# Patient Record
Sex: Female | Born: 1935
Health system: Southern US, Community
[De-identification: ages and names within clinical notes are randomized; demographics above are authoritative.]

## PROBLEM LIST (undated history)

## (undated) DIAGNOSIS — M2540 Effusion, unspecified joint: Secondary | ICD-10-CM

## (undated) DIAGNOSIS — K5790 Diverticulosis of intestine, part unspecified, without perforation or abscess without bleeding: Secondary | ICD-10-CM

## (undated) DIAGNOSIS — Z860100 Personal history of colon polyps, unspecified: Secondary | ICD-10-CM

## (undated) DIAGNOSIS — M79606 Pain in leg, unspecified: Secondary | ICD-10-CM

## (undated) DIAGNOSIS — R002 Palpitations: Secondary | ICD-10-CM

## (undated) DIAGNOSIS — R233 Spontaneous ecchymoses: Secondary | ICD-10-CM

## (undated) DIAGNOSIS — M353 Polymyalgia rheumatica: Secondary | ICD-10-CM

## (undated) DIAGNOSIS — D126 Benign neoplasm of colon, unspecified: Secondary | ICD-10-CM

## (undated) DIAGNOSIS — R351 Nocturia: Secondary | ICD-10-CM

## (undated) DIAGNOSIS — M1990 Unspecified osteoarthritis, unspecified site: Secondary | ICD-10-CM

## (undated) DIAGNOSIS — M2550 Pain in unspecified joint: Secondary | ICD-10-CM

## (undated) DIAGNOSIS — K259 Gastric ulcer, unspecified as acute or chronic, without hemorrhage or perforation: Secondary | ICD-10-CM

## (undated) DIAGNOSIS — R490 Dysphonia: Secondary | ICD-10-CM

## (undated) DIAGNOSIS — Z8619 Personal history of other infectious and parasitic diseases: Secondary | ICD-10-CM

## (undated) DIAGNOSIS — M539 Dorsopathy, unspecified: Secondary | ICD-10-CM

## (undated) DIAGNOSIS — M797 Fibromyalgia: Secondary | ICD-10-CM

## (undated) DIAGNOSIS — R238 Other skin changes: Secondary | ICD-10-CM

## (undated) HISTORY — PX: TUBAL LIGATION: SHX77

## (undated) HISTORY — DX: Polymyalgia rheumatica: M35.3

## (undated) HISTORY — PX: CYSTECTOMY: SUR359

## (undated) HISTORY — PX: CATARACT EXTRACTION, BILATERAL: SHX1313

## (undated) HISTORY — DX: Personal history of other infectious and parasitic diseases: Z86.19

## (undated) HISTORY — DX: Benign neoplasm of colon, unspecified: D12.6

## (undated) HISTORY — DX: Palpitations: R00.2

## (undated) HISTORY — PX: PARTIAL HYSTERECTOMY: SHX80

## (undated) HISTORY — DX: Pain in leg, unspecified: M79.606

## (undated) HISTORY — PX: OTHER SURGICAL HISTORY: SHX169

## (undated) HISTORY — PX: CARPAL TUNNEL RELEASE: SHX101

## (undated) HISTORY — PX: ABDOMINAL HYSTERECTOMY: SHX81

## (undated) HISTORY — PX: COLONOSCOPY: SHX174

## (undated) HISTORY — DX: Diverticulosis of intestine, part unspecified, without perforation or abscess without bleeding: K57.90

---

## 1996-07-28 DIAGNOSIS — D126 Benign neoplasm of colon, unspecified: Secondary | ICD-10-CM

## 1996-07-28 HISTORY — DX: Benign neoplasm of colon, unspecified: D12.6

## 1996-08-11 ENCOUNTER — Encounter: Payer: Self-pay | Admitting: Gastroenterology

## 1999-03-29 ENCOUNTER — Other Ambulatory Visit: Admission: RE | Admit: 1999-03-29 | Discharge: 1999-03-29 | Payer: Self-pay | Admitting: General Surgery

## 1999-04-09 ENCOUNTER — Ambulatory Visit (HOSPITAL_BASED_OUTPATIENT_CLINIC_OR_DEPARTMENT_OTHER): Admission: RE | Admit: 1999-04-09 | Discharge: 1999-04-09 | Payer: Self-pay | Admitting: General Surgery

## 1999-11-22 ENCOUNTER — Encounter (INDEPENDENT_AMBULATORY_CARE_PROVIDER_SITE_OTHER): Payer: Self-pay | Admitting: Specialist

## 1999-11-22 ENCOUNTER — Other Ambulatory Visit: Admission: RE | Admit: 1999-11-22 | Discharge: 1999-11-22 | Payer: Self-pay | Admitting: Gastroenterology

## 2006-02-26 ENCOUNTER — Ambulatory Visit: Payer: Self-pay | Admitting: Gastroenterology

## 2006-03-12 ENCOUNTER — Encounter (INDEPENDENT_AMBULATORY_CARE_PROVIDER_SITE_OTHER): Payer: Self-pay | Admitting: *Deleted

## 2006-03-12 ENCOUNTER — Ambulatory Visit: Payer: Self-pay | Admitting: Gastroenterology

## 2006-05-20 ENCOUNTER — Encounter: Admission: RE | Admit: 2006-05-20 | Discharge: 2006-05-20 | Payer: Self-pay | Admitting: Sports Medicine

## 2006-07-31 ENCOUNTER — Encounter (INDEPENDENT_AMBULATORY_CARE_PROVIDER_SITE_OTHER): Payer: Self-pay | Admitting: *Deleted

## 2006-07-31 ENCOUNTER — Encounter: Admission: RE | Admit: 2006-07-31 | Discharge: 2006-07-31 | Payer: Self-pay | Admitting: Rheumatology

## 2006-08-20 ENCOUNTER — Ambulatory Visit: Payer: Self-pay | Admitting: Oncology

## 2006-09-28 LAB — CBC WITH DIFFERENTIAL/PLATELET
Basophils Absolute: 0 10*3/uL (ref 0.0–0.1)
EOS%: 0.4 % (ref 0.0–7.0)
LYMPH%: 16.3 % (ref 14.0–48.0)
MCH: 32.2 pg (ref 26.0–34.0)
MCV: 94 fL (ref 81.0–101.0)
MONO%: 4.3 % (ref 0.0–13.0)
Platelets: 336 10*3/uL (ref 145–400)
RBC: 4.45 10*6/uL (ref 3.70–5.32)
RDW: 15.3 % — ABNORMAL HIGH (ref 11.3–14.5)

## 2006-09-28 LAB — MORPHOLOGY: RBC Comments: NORMAL

## 2006-09-28 LAB — ERYTHROCYTE SEDIMENTATION RATE: Sed Rate: 5 mm/hr (ref 0–30)

## 2006-10-03 LAB — KAPPA/LAMBDA LIGHT CHAINS: Kappa:Lambda Ratio: 1.63 (ref 0.26–1.65)

## 2006-10-03 LAB — IMMUNOFIXATION ELECTROPHORESIS
IgA: 451 mg/dL — ABNORMAL HIGH (ref 68–378)
IgM, Serum: 73 mg/dL (ref 60–263)
Total Protein, Serum Electrophoresis: 7 g/dL (ref 6.0–8.3)

## 2006-10-05 ENCOUNTER — Ambulatory Visit: Payer: Self-pay | Admitting: Oncology

## 2006-10-05 LAB — CRYOGLOBULIN: Cryoglobulin: NEGATIVE

## 2006-10-23 LAB — UIFE/LIGHT CHAINS/TP QN, 24-HR UR
Albumin, U: DETECTED
Free Kappa/Lambda Ratio: 2.11 ratio (ref 0.46–4.00)
Free Lambda Excretion/Day: 1.98 mg/d
Free Lambda Lt Chains,Ur: 0.09 mg/dL (ref 0.08–1.01)
Free Lt Chn Excr Rate: 4.18 mg/d
Time: 24 hours
Total Protein, Urine-Ur/day: 11 mg/d (ref 10–140)
Total Protein, Urine: 0.5 mg/dL
Volume, Urine: 2200 mL

## 2006-12-29 ENCOUNTER — Ambulatory Visit: Payer: Self-pay | Admitting: Internal Medicine

## 2008-03-07 ENCOUNTER — Encounter: Payer: Self-pay | Admitting: Internal Medicine

## 2008-07-05 ENCOUNTER — Encounter: Payer: Self-pay | Admitting: Internal Medicine

## 2008-07-26 ENCOUNTER — Telehealth: Payer: Self-pay | Admitting: Gastroenterology

## 2008-08-02 ENCOUNTER — Encounter: Admission: RE | Admit: 2008-08-02 | Discharge: 2008-08-02 | Payer: Self-pay | Admitting: Orthopedic Surgery

## 2008-08-15 ENCOUNTER — Ambulatory Visit: Payer: Self-pay | Admitting: Gastroenterology

## 2008-08-15 DIAGNOSIS — K5150 Left sided colitis without complications: Secondary | ICD-10-CM

## 2008-08-15 DIAGNOSIS — Z860100 Personal history of colon polyps, unspecified: Secondary | ICD-10-CM | POA: Insufficient documentation

## 2008-09-13 ENCOUNTER — Encounter: Payer: Self-pay | Admitting: Gastroenterology

## 2008-09-13 ENCOUNTER — Ambulatory Visit: Payer: Self-pay | Admitting: Gastroenterology

## 2008-09-14 ENCOUNTER — Encounter: Payer: Self-pay | Admitting: Gastroenterology

## 2008-10-11 ENCOUNTER — Telehealth: Payer: Self-pay | Admitting: Gastroenterology

## 2008-11-02 ENCOUNTER — Encounter: Payer: Self-pay | Admitting: Internal Medicine

## 2008-11-15 ENCOUNTER — Ambulatory Visit (HOSPITAL_COMMUNITY): Admission: RE | Admit: 2008-11-15 | Discharge: 2008-11-15 | Payer: Self-pay | Admitting: Physician Assistant

## 2009-06-04 ENCOUNTER — Telehealth: Payer: Self-pay | Admitting: Gastroenterology

## 2009-07-16 ENCOUNTER — Ambulatory Visit: Payer: Self-pay | Admitting: Gastroenterology

## 2009-11-12 ENCOUNTER — Telehealth: Payer: Self-pay | Admitting: Gastroenterology

## 2009-11-15 ENCOUNTER — Ambulatory Visit: Payer: Self-pay | Admitting: Internal Medicine

## 2009-11-15 DIAGNOSIS — M353 Polymyalgia rheumatica: Secondary | ICD-10-CM

## 2009-11-22 ENCOUNTER — Telehealth: Payer: Self-pay | Admitting: Gastroenterology

## 2010-01-14 ENCOUNTER — Ambulatory Visit: Payer: Self-pay | Admitting: Gastroenterology

## 2010-01-15 LAB — CONVERTED CEMR LAB
Basophils Absolute: 0.1 10*3/uL (ref 0.0–0.1)
Basophils Relative: 1 % (ref 0.0–3.0)
Eosinophils Absolute: 0.3 10*3/uL (ref 0.0–0.7)
HCT: 39.9 % (ref 36.0–46.0)
Hemoglobin: 13.9 g/dL (ref 12.0–15.0)
Lymphs Abs: 2.7 10*3/uL (ref 0.7–4.0)
MCHC: 34.8 g/dL (ref 30.0–36.0)
MCV: 98.9 fL (ref 78.0–100.0)
Neutro Abs: 3.4 10*3/uL (ref 1.4–7.7)
RDW: 13.3 % (ref 11.5–14.6)

## 2010-03-06 ENCOUNTER — Ambulatory Visit: Payer: Self-pay | Admitting: Gastroenterology

## 2010-03-15 ENCOUNTER — Telehealth: Payer: Self-pay | Admitting: Gastroenterology

## 2010-07-04 ENCOUNTER — Telehealth: Payer: Self-pay | Admitting: Gastroenterology

## 2010-07-09 ENCOUNTER — Encounter: Payer: Self-pay | Admitting: Gastroenterology

## 2010-08-15 ENCOUNTER — Ambulatory Visit
Admission: RE | Admit: 2010-08-15 | Discharge: 2010-08-15 | Payer: Self-pay | Source: Home / Self Care | Attending: Gastroenterology | Admitting: Gastroenterology

## 2010-08-15 ENCOUNTER — Encounter: Payer: Self-pay | Admitting: Gastroenterology

## 2010-08-28 ENCOUNTER — Other Ambulatory Visit (AMBULATORY_SURGERY_CENTER): Payer: Medicare Other | Admitting: Gastroenterology

## 2010-08-28 ENCOUNTER — Ambulatory Visit: Admit: 2010-08-28 | Payer: Self-pay | Admitting: Gastroenterology

## 2010-08-28 ENCOUNTER — Other Ambulatory Visit: Payer: Self-pay | Admitting: Gastroenterology

## 2010-08-28 DIAGNOSIS — Z86010 Personal history of colonic polyps: Secondary | ICD-10-CM

## 2010-08-28 DIAGNOSIS — Z1211 Encounter for screening for malignant neoplasm of colon: Secondary | ICD-10-CM

## 2010-08-28 DIAGNOSIS — K5730 Diverticulosis of large intestine without perforation or abscess without bleeding: Secondary | ICD-10-CM

## 2010-08-28 DIAGNOSIS — K5190 Ulcerative colitis, unspecified, without complications: Secondary | ICD-10-CM

## 2010-08-28 DIAGNOSIS — K5289 Other specified noninfective gastroenteritis and colitis: Secondary | ICD-10-CM

## 2010-08-29 NOTE — Letter (Signed)
Summary: Office Visit Letter  [facility]  [address] [place], [place] [address]   Phone: [phone]  Fax: [phone]      July 09, 2010 MRN: [id number]   [place], [place]  [address]   Dear Ms. Whitehouse,   According to our records, it is time for you to schedule a follow-up office visit with [place].   At your convenience, please call [phone] (option #2)to schedule an office visit. If you have any questions, concerns, or feel that this letter is in error, we would appreciate your call.   Sincerely,    Norberto Sorenson T. Fuller Plan, M.D.  [facility] [phone]

## 2010-08-29 NOTE — Progress Notes (Signed)
Summary: speak to nurse   Phone Note Call from Patient Call back at Home Phone [phone]   Caller: Patient Call For: Fuller Plan Reason for Call: Talk to Nurse Summary of Call: Patient wants to speak directly to [facility] Initial call taken by: Ronalee Red,  November 22, 2009 11:13 AM  Follow-up for Phone Call        Pt. called with update for Nevin Bloodgood that she requested. Says she is feeling so much better. Having formed stools now and has not had any more bloody bm's and has no pain. Follow-up by: Abel Presto RN,  November 22, 2009 11:58 AM  Additional Follow-up for Phone Call Additional follow up Details #1::        Good news. If she is scheduled to see me soon then that can be cancelled. She can follow up with primary GI doc. in few weeks instead. thanks Additional Follow-up by: Tye Savoy NP,  November 23, 2009 4:10 PM    Additional Follow-up for Phone Call Additional follow up Details #2::    Noted. Follow-up by: Ladene Artist MD Marval Regal,  Nov 25, 2009 5:06 PM

## 2010-08-29 NOTE — Assessment & Plan Note (Signed)
Summary: 3 WEEKS F/U..AM.   History of Present Illness Visit Type: Follow-up Visit Primary GI MD: Joylene Igo MD [facility] Primary Provider: Heath Gold, PA Requesting Provider: na Chief Complaint: F/u for Ulcerative colitis flare, denies any GI complaints  History of Present Illness:   This is a return office visit for flare of ulcerative colitis. She has 1- 2 bowel moveme 5-ASA medications if there is one tohis last colonoscopy per day. She has no bleeding or mucus in her stools and no lower abdominal pain. She would like to change 5-ASA medications if there is a less expensive alternative.   GI Review of Systems      Denies abdominal pain, acid reflux, belching, bloating, chest pain, dysphagia with liquids, dysphagia with solids, heartburn, loss of appetite, nausea, vomiting, vomiting blood, weight loss, and  weight gain.        Denies anal fissure, black tarry stools, change in bowel habit, constipation, diarrhea, diverticulosis, fecal incontinence, heme positive stool, hemorrhoids, irritable bowel syndrome, jaundice, light color stool, liver problems, rectal bleeding, and  rectal pain.   Current Medications (verified): 1)  Colazal 750 Mg  Caps (Balsalazide Disodium) .... Take 2 Capsules By Mouth Three Times A Day 2)  Calcium 1500 Mg Tabs (Calcium Carbonate) .... Take 1 Tablet By Mouth Once A Day 3)  Vitamin D 1000 Unit Caps (Cholecalciferol) .... Take 1 Tablet By Mouth Once A Day 4)  Prednisone 1 Mg Tabs (Prednisone) .... Take Two By Mouth Once Daily 5)  Premarin 0.625 Mg Tabs (Estrogens Conjugated) .... Take 1/2 Tablet By Mouth Daily 6)  Vitamin B-12 1000 Mcg Tabs (Cyanocobalamin) .... Once Daily 7)  Biotin 1000 Mcg Tabs (Biotin) .... Once Daily 8)  Fish Oil 500 Mg Caps (Omega-3 Fatty Acids) .... Take One By Mouth Once Daily  Allergies (verified): No Known Drug Allergies  Past History:  Past Medical History: Reviewed history from 07/16/2009 and no changes  required. Ulcerative Colitis since 1970 Diverticulosis Hemorrhoids Polymyalgia rheumatica Elevated [place] Spotted Fever titer 04/2006 Tubulovillous adenomatous colon polyps 07/1996  Past Surgical History: Reviewed history from 07/16/2009 and no changes required. Hysterectomy Tubal Ligation Breast Lumpectomy x 2-Right  Family History: Reviewed history from 01/14/2010 and no changes required. Family History of Heart Disease: Father Family History of Breast Cancer: Sister, daughter, neice No FH of Colon Cancer:  Social History: Reviewed history from 07/16/2009 and no changes required. Widowed Retired Patient has never smoked.  Daily Caffeine Use 1-2 cup/day Illicit Drug Use - no Alcohol Use - yes-rare Patient gets regular exercise.  Review of Systems       The patient complains of arthritis/joint pain, back pain, hearing problems, night sweats, and sore throat.         The pertinent positives and negatives are noted as above and in the HPI. All other ROS were reviewed and were negative.   Vital Signs:  Patient profile:   77 year old female Height:      62 inches Weight:      143 pounds BMI:     26.25 BSA:     1.66 Pulse rate:   84 / minute Pulse rhythm:   regular BP sitting:   124 / 60  (left arm) Cuff size:   regular  Vitals Entered By: Hope Pigeon CMA (March 06, 2010 2:50 PM)  Physical Exam  General:  Well developed, well nourished, no acute distress. Head:  Normocephalic and atraumatic. Eyes:  PERRLA, no icterus. Mouth:  No deformity  or lesions, dentition normal. Lungs:  Clear throughout to auscultation. Heart:  Regular rate and rhythm; no murmurs, rubs,  or bruits. Abdomen:  Soft, nontender and nondistended. No masses, hepatosplenomegaly or hernias noted. Normal bowel sounds. Psych:  Alert and cooperative. Normal mood and affect.  Impression & Recommendations:  Problem # 1:  ULCERATIVE COLITIS-LEFT SIDE (ICD-556.5) Resolved flare. Change to  Asacol 1.6 g t.i.d. after completing current Colazal prescription as it appears Asacol is better covered under her insurance plan. Surveillance colonoscopy February 2012.  Problem # 2:  PERSONAL HX COLONIC POLYPS (ICD-V12.72) Personal history of tubulovillous adenomatous colon polyp. Surveillance colonoscopy as above.  Patient Instructions: 1)  You prescription has been sent to your pharmacy.  2)  Please continue current medications.  3)  Please schedule a follow-up appointment in 1 year. 4)  Copy sent to : Heath Gold, PA 5)  The medication list was reviewed and reconciled.  All changed / newly prescribed medications were explained.  A complete medication list was provided to the patient / caregiver.  Prescriptions: ASACOL HD 800 MG TBEC (MESALAMINE) 2 tablets by mouth three times a day  #180 x 11   Entered by:   Marlon Pel CMA ([place])   Authorized by:   Ladene Artist MD [facility]   Signed by:   Ladene Artist MD [facility] on 03/06/2010   Method used:   Electronically to        [facility] (retail)       [address]       [place], [place]  [address]       Ph: [phone]       Fax: [phone]   RxID:   [id number]

## 2010-08-29 NOTE — Progress Notes (Signed)
   Phone Note Other Incoming   Summary of Call: Pt. stopped by office to ask if something other than Colazal can be ordered for her UC as insurance won't pay for it. She is going to check on what they will pay for and call back. She found out 2 weeks ago that her 74 yr.old daughter has breast cancer and since then has been having  4-5 bloody stools a day with cramping and urgency. Symptoms are worse in the am. She increased her Colazal to 2 in the am and 2 in the pm when she became symptomatic. She is coming back to [facility] on Thursday and is asking if you would like her seen by NP or what do you recommend? Initial call taken by: Abel Presto RN,  November 12, 2009 3:02 PM  Follow-up for Phone Call        Increase Colazal to 2 by mouth three times a day and she should be seen soon in the office. Follow-up by: Ladene Artist MD Marval Regal,  November 12, 2009 3:06 PM  Additional Follow-up for Phone Call Additional follow up Details #1::        Discussed Dr.Patrice Matthew's orders with pt. and she wiil see NP on 11/15/09. Additional Follow-up by: Abel Presto RN,  November 12, 2009 4:54 PM    Additional Follow-up for Phone Call Additional follow up Details #2::    Message left to call back    Abel Presto RN  November 12, 2009 3:28 PM

## 2010-08-29 NOTE — Progress Notes (Signed)
Summary: Medication  Medications Added COLAZAL 750 MG CAPS (BALSALAZIDE DISODIUM) 2 tablet by mouth three times a day       Phone Note Call from Patient Call back at [facility] Phone [phone]   Caller: Patient Call For: Dr. Fuller Plan Reason for Call: Talk to Nurse Summary of Call: Asacol is too expensive...Marland Kitchenneeds an alternative medication Initial call taken by: Webb Laws,  July 04, 2010 11:15 AM  Follow-up for Phone Call        Pt states her medication is 300 dollars now b/c she is in the donut hole. She was told by the pharmacist that Colazal is a generic and is only 100 dollars. Pt wants to know if she can be switched to something cheaper. Pt also states she has had some rectal bleeding and mucus in her stool the last several weeks. Denies change in her bowels or pain. Told pt she needs to schedule a office visit but pt states she does not want to be seen until after the first of the year due to insurance. Told pt that I don't think she should wait to be seen and might have to be worked in with a extender or the doctor but will get the nurse to call her and triage her symptoms. Pt agreed and I will route the note to doctor about medicines and Sheri for the triage questions.  Follow-up by: Marlon Pel CMA Deborra Medina),  July 04, 2010 1:02 PM  Additional Follow-up for Phone Call Additional follow up Details #1::        OK to change to Colazal or its generic, take 2 by mouth three times a day. Additional Follow-up by: Ladene Artist MD Marval Regal,  July 04, 2010 1:14 PM    Additional Follow-up for Phone Call Additional follow up Details #2::    Patient c/o occasional rectal bleeding, excess gas and mucus that will intermittently last a day or two then clears up.  Denies diarrhea.  Patient  states this is a typical pattern for her and she wants to wait and see Dr Fuller Plan after the first of the year due to insurance purposes (she is in the donut hole). I have asked her to call us if her  sympltoms worsen.  She is advised that Dr Fuller Plan has approved her to cahnge to generic colazal, but she needs to let us know if her symptoms worsen being on the generic medication and call us.  Patient verblaizes understanding.  She will keep her appointment in [place] for now.   Follow-up by: Barb Merino RN, [place],  July 04, 2010 2:09 PM  Additional Follow-up for Phone Call Additional follow up Details #3:: Details for Additional Follow-up Action Taken: Above noted. Additional Follow-up by: Ladene Artist MD Marval Regal,  July 04, 2010 5:01 PM  New/Updated Medications: COLAZAL 750 MG CAPS (BALSALAZIDE DISODIUM) 2 tablet by mouth three times a day Prescriptions: COLAZAL 750 MG CAPS (BALSALAZIDE DISODIUM) 2 tablet by mouth three times a day  #180 x 5   Entered by:   Marlon Pel CMA ([place])   Authorized by:   Ladene Artist MD [facility]   Signed by:   Marlon Pel CMA ([place]) on 07/04/2010   Method used:   Electronically to        [facility] (retail)       [address]       [place], [place]  [address]  Ph: [phone]       Fax: [phone]   RxID:   [id number]

## 2010-08-29 NOTE — Assessment & Plan Note (Signed)
Summary: f/u uc flare (last seen by paula)/dn   History of Present Illness Visit Type: Follow-up Visit Primary GI MD: Joylene Igo MD [facility] Primary Provider: [name], PA Chief Complaint: Patient following up on a UC flare, she states that she is having no problems today but she was until yesterday. She was still seeing some blood in her stool and mucous. She complains of frequent BMs. She complains of lower abdominal cramping.  History of Present Illness:   Mrs. Defalco is here today with her husband, and she reports that she is generally having 4-5 bloody and mucousy stools for the past several weeks. She states her symptoms initially improved while on hydrocortisone enemas in April. For the past 2 days, she has had one bowel movement each day which has been unusually good for her. She notes mild lower abdominal discomfort as well. She has a history of left-sided ulcerative colitis with proctitis noted on her last colonoscopy.   GI Review of Systems    Reports abdominal pain.     Location of  Abdominal pain: lower abdomen.    Denies acid reflux, belching, bloating, chest pain, dysphagia with liquids, dysphagia with solids, heartburn, loss of appetite, nausea, vomiting, vomiting blood, weight loss, and  weight gain.      Reports rectal bleeding.     Denies anal fissure, black tarry stools, change in bowel habit, constipation, diarrhea, diverticulosis, fecal incontinence, heme positive stool, hemorrhoids, irritable bowel syndrome, jaundice, light color stool, liver problems, and  rectal pain.   Current Medications (verified): 1)  Colazal 750 Mg  Caps (Balsalazide Disodium) .... Take 2 Capsules By Mouth Three Times A Day 2)  Calcium 1500 Mg Tabs (Calcium Carbonate) .... Take 1 Tablet By Mouth Once A Day 3)  Vitamin D 1000 Unit Caps (Cholecalciferol) .... Take 1 Tablet By Mouth Once A Day 4)  Prednisone 1 Mg Tabs (Prednisone) .... Take Two By Mouth Once Daily 5)  Premarin 0.625 Mg  Tabs (Estrogens Conjugated) .... Take 1/2 Tablet By Mouth Daily 6)  Vitamin B-12 1000 Mcg Tabs (Cyanocobalamin) .... Once Daily 7)  Biotin 1000 Mcg Tabs (Biotin) .... Once Daily 8)  Fish Oil 500 Mg Caps (Omega-3 Fatty Acids) .... Take One By Mouth Once Daily  Allergies (verified): No Known Drug Allergies  Past History:  Past Medical History: Reviewed history from 07/16/2009 and no changes required. Ulcerative Colitis since 1970 Diverticulosis Hemorrhoids Polymyalgia rheumatica Elevated [place] Spotted Fever titer 04/2006 Tubulovillous adenomatous colon polyps 07/1996  Past Surgical History: Reviewed history from 07/16/2009 and no changes required. Hysterectomy Tubal Ligation Breast Lumpectomy x 2-Right  Family History: Family History of Heart Disease: Father Family History of Breast Cancer: Sister, daughter, neice No FH of Colon Cancer:  Social History: Reviewed history from 07/16/2009 and no changes required. Widowed Retired Patient has never smoked.  Daily Caffeine Use 1-2 cup/day Illicit Drug Use - no Alcohol Use - yes-rare Patient gets regular exercise.  Review of Systems       The patient complains of arthritis/joint pain, back pain, and muscle pains/cramps.         .ros  Vital Signs:  Patient profile:   77 year old female Height:      62 inches Weight:      146.0 pounds BMI:     26.80 Pulse rate:   78 / minute Pulse rhythm:   regular BP sitting:   124 / 68  (left arm) Cuff size:   regular  Vitals Entered  By: Bernita Buffy CMA Deborra Medina) (January 14, 2010 3:02 PM)  Physical Exam  General:  Well developed, well nourished, no acute distress. Head:  Normocephalic and atraumatic. Eyes:  PERRLA, no icterus. Ears:  Normal auditory acuity. Mouth:  No deformity or lesions, dentition normal. Neck:  Supple; no masses or thyromegaly. Lungs:  Clear throughout to auscultation. Heart:  Regular rate and rhythm; no murmurs, rubs,  or bruits. Abdomen:  Soft,  nontender and nondistended. No masses, hepatosplenomegaly or hernias noted. Normal bowel sounds. Pulses:  Normal pulses noted. Extremities:  No clubbing, cyanosis, edema or deformities noted. Neurologic:  Alert and  oriented x4;  grossly normal neurologically. Psych:  Alert and cooperative. Normal mood and affect.  Impression & Recommendations:  Problem # 1:  ULCERATIVE COLITIS-LEFT SIDE (ICD-556.5) Long-standing left-sided ulcerative colitis with proctitis noted on her most recent colonoscopy. She has had a flare for several weeks, which has recently improved. Begin Canasa 1000 mg suppositories b.i.d. for 2 weeks and then q.d. for 4 weeks. Continue Colazal or an equivalent 5-ASA agent. Surveillance colonoscopy recommended in March 2012. Orders: TLB-CBC Platelet - w/Differential ([id number]-[id number]) TLB-Sedimentation Rate (ESR) ([id number])  Problem # 2:  PERSONAL HX COLONIC POLYPS (ICD-V12.72) She has a history of tubulovillous adenomatous colon polyps initially diagnosed in 1998. Surveillance colonoscopy as above.  Patient Instructions: 1)  Get your labs drawn today in the basement.  2)  Pick up your prescription from your pharmacy.  3)  Some other mesalamine medications are Apriso, Asacol, Lialda, Pentasa. 4)  Please schedule a follow-up appointment in 3 weeks.  5)  Copy sent to : [name], PA 6)  The medication list was reviewed and reconciled.  All changed / newly prescribed medications were explained.  A complete medication list was provided to the patient / caregiver.  Prescriptions: ROWASA 4 GM KIT (MESALAMINE-CLEANSER) one suppository into rectum two times a day x 2 weeks then reduce to one suppository into rectum x 4 weeks  #56 x 0   Entered by:   Marlon Pel CMA ([place])   Authorized by:   Ladene Artist MD [facility]   Signed by:   Ladene Artist MD [facility] on 01/14/2010   Method used:   Electronically to        [facility] (retail)       [address]       [place], [place]  [address]       Ph: [phone]       Fax: [phone]   RxID:   [id number]

## 2010-08-29 NOTE — Letter (Signed)
Summary: [facility] Instructions  [facility]  [place], [place] [address]   Phone: [phone]  Fax: [phone]       Kathleen Morales    [birth year]    MRN: [id number]        Procedure Day Sudie Grumbling: Wednesday February 1st, 2012     Arrival Time: 7:30am     Procedure Time: 8:30am     Location of Procedure:                    _ x_  [facility] (4th Floor)                        [place]   Starting 5 days prior to your procedure 08/23/10 do not eat nuts, seeds, popcorn, corn, beans, peas,  salads, or any raw vegetables.  Do not take any fiber supplements (e.g. Metamucil, Citrucel, and Benefiber).  THE DAY BEFORE YOUR PROCEDURE         DATE: 08/27/10  DAY: Tuesday  1.  Drink clear liquids the entire day-NO SOLID FOOD  2.  Do not drink anything colored red or purple.  Avoid juices with pulp.  No orange juice.  3.  Drink at least 64 oz. (8 glasses) of fluid/clear liquids during the day to prevent dehydration and help the prep work efficiently.  CLEAR LIQUIDS INCLUDE: Water Jello Ice Popsicles Tea (sugar ok, no milk/cream) Powdered fruit flavored drinks Coffee (sugar ok, no milk/cream) Gatorade Juice: apple, white grape, white cranberry  Lemonade Clear bullion, consomm, broth Carbonated beverages (any kind) Strained chicken noodle soup Hard Candy                             4.  In the morning, mix first dose of MoviPrep solution:    Empty 1 Pouch A and 1 Pouch B into the disposable container    Add lukewarm drinking water to the top line of the container. Mix to dissolve    Refrigerate (mixed solution should be used within 24 hrs)  5.  Begin drinking the prep at 5:00 p.m. The MoviPrep container is divided by 4 marks.   Every 15 minutes drink the solution down to the next mark (approximately 8 oz) until the full liter is complete.   6.  Follow completed prep with 16 oz of clear liquid of your  choice (Nothing red or purple).  Continue to drink clear liquids until bedtime.  7.  Before going to bed, mix second dose of MoviPrep solution:    Empty 1 Pouch A and 1 Pouch B into the disposable container    Add lukewarm drinking water to the top line of the container. Mix to dissolve    Refrigerate  THE DAY OF YOUR PROCEDURE      DATE: 08/28/10 DAY: Wednesday  Beginning at 3:30 a.m. (5 hours before procedure):         1. Every 15 minutes, drink the solution down to the next mark (approx 8 oz) until the full liter is complete.  2. Follow completed prep with 16 oz. of clear liquid of your choice.    3. You may drink clear liquids until 6:30am (2 HOURS BEFORE PROCEDURE).   MEDICATION INSTRUCTIONS  Unless otherwise instructed, you should take regular prescription medications with a small sip of water   as early as possible the morning of your  procedure.         OTHER INSTRUCTIONS  You will need a responsible adult at least 75 years of age to accompany you and drive you home.   This person must remain in the waiting room during your procedure.  Wear loose fitting clothing that is easily removed.  Leave jewelry and other valuables at home.  However, you may wish to bring a book to read or  an iPod/MP3 player to listen to music as you wait for your procedure to start.  Remove all body piercing jewelry and leave at home.  Total time from sign-in until discharge is approximately 2-3 hours.  You should go home directly after your procedure and rest.  You can resume normal activities the  day after your procedure.  The day of your procedure you should not:   Drive   Make legal decisions   Operate machinery   Drink alcohol   Return to work  You will receive specific instructions about eating, activities and medications before you leave.    The above instructions have been reviewed and explained to me by   Estill Bamberg.    I fully understand and can verbalize these  instructions _____________________________ Date _________

## 2010-08-29 NOTE — Assessment & Plan Note (Signed)
Summary: UC flare-Dr. Fuller Plan pt.    History of Present Illness Visit Type: Follow-up Visit Primary GI MD: Joylene Igo MD [facility] Primary Provider: [name], PA Chief Complaint: ulcerative colitis flare, med refill History of Present Illness:   Mr. Kathleen Morales is followed by Dr. Fuller Plan for ulcerative colitis, she was last seen December 2010. Patient had been doing well until six days ago when she developed lower abdominal cramps, and bloody mucoid stools. She telephoned our office, her Colazal was increased and an appt. given for office follow. Kathleen Morales definately feels she is in a flare. States last flare was been at a year ago. No fevers. No vomiting. No recent antibiotics.    GI Review of Systems    Reports abdominal pain and  bloating.     Location of  Abdominal pain: lower abdomen.    Denies acid reflux, belching, chest pain, dysphagia with liquids, dysphagia with solids, heartburn, loss of appetite, nausea, vomiting, vomiting blood, weight loss, and  weight gain.      Reports diarrhea and  rectal bleeding.     Denies anal fissure, black tarry stools, change in bowel habit, constipation, diverticulosis, fecal incontinence, heme positive stool, hemorrhoids, irritable bowel syndrome, jaundice, light color stool, liver problems, and  rectal pain.    Current Medications (verified): 1)  Colazal 750 Mg  Caps (Balsalazide Disodium) .... Take 2 Tablets By Mouth Once Daily 2)  Calcium 1500 Mg Tabs (Calcium Carbonate) .... Take 1 Tablet By Mouth Once A Day 3)  Vitamin D 1000 Unit Caps (Cholecalciferol) .... Take 1 Tablet By Mouth Once A Day 4)  Prednisone 5 Mg Tabs (Prednisone) .... 3.5 Mg Once Daily 5)  Premarin 0.625 Mg Tabs (Estrogens Conjugated) .... Take 1/2 Tablet By Mouth Daily 6)  Vitamin B-12 1000 Mcg Tabs (Cyanocobalamin) .... Once Daily 7)  Biotin 1000 Mcg Tabs (Biotin) .... Once Daily  Allergies (verified): No Known Drug Allergies  Past History:  Past Medical  History: Reviewed history from 07/16/2009 and no changes required. Ulcerative Colitis since 1970 Diverticulosis Hemorrhoids Polymyalgia rheumatica Elevated [place] Spotted Fever titer 04/2006 Tubulovillous adenomatous colon polyps 07/1996  Past Surgical History: Reviewed history from 07/16/2009 and no changes required. Hysterectomy Tubal Ligation Breast Lumpectomy x 2-Right  Family History: Reviewed history from 08/15/2008 and no changes required. Family History of Heart Disease: Father Family History of Breast Cancer: Sister No FH of Colon Cancer:  Social History: Reviewed history from 07/16/2009 and no changes required. Widowed Retired Patient has never smoked.  Daily Caffeine Use 1-2 cup/day Illicit Drug Use - no Alcohol Use - yes-rare Patient gets regular exercise.  Review of Systems       The patient complains of arthritis/joint pain, back pain, and muscle pains/cramps.  The patient denies allergy/sinus, anemia, anxiety-new, blood in urine, breast changes/lumps, change in vision, confusion, cough, coughing up blood, depression-new, fainting, fatigue, fever, headaches-new, hearing problems, heart murmur, heart rhythm changes, itching, menstrual pain, night sweats, nosebleeds, pregnancy symptoms, shortness of breath, skin rash, sleeping problems, sore throat, swelling of feet/legs, swollen lymph glands, thirst - excessive , urination - excessive , urination changes/pain, urine leakage, vision changes, and voice change.    Vital Signs:  Patient profile:   77 year old female Height:      62 inches Weight:      146.50 pounds BMI:     26.89 Pulse rate:   64 / minute Pulse rhythm:   regular BP sitting:   128 / 70  (left  arm) Cuff size:   regular  Vitals Entered By: June McMurray [place] Deborra Medina) (November 15, 2009 9:20 AM)  Physical Exam  General:  Well developed, well nourished, no acute distress. Head:  Normocephalic and atraumatic. Eyes:  Conjunctiva pink, no  icterus.  Mouth:  Conjunctiva pink, no icterus.  Lungs:  Clear throughout to auscultation. Heart:  RRR Abdomen:  Abdomen soft, nondistended. Mild, diffuse lower abdominal tenderness. No obvious masses or hepatomegaly.Normal bowel sounds.  Psych:  Alert and cooperative. Normal mood and affect.   Impression & Recommendations:  Problem # 1:  ULCERATIVE COLITIS-LEFT SIDE (ICD-556.5) Last colonoscopy 2/10 revealed proctitis. Had been doing well over the last year. Now with six day history of cramps, bloody mucoid stools. No recent antibiotics. No fevers. Patient looks good, no significant tenderness on examination. Her Colazal was increased a few days ago and hopefully she will soon see results from that. For now will try Cortenemas. Follow up with Dr. Fuller Plan.   Problem # 2:  POLYMYALGIA RHEUMATICA (ICD-725) Assessment: Comment Only On low dose Prednisone.  Patient Instructions: 1)  Please follow a low residue diet. We have given you a brochure on this. 2)  Please pick up your hydrocortisone enemas at the pharmacy. An electronic prescription has already been sent to them. You will be inserting 1 enema into the rectum at bedtime x 14 days. 3)  Pick up your new colozal prescription at the pharmacy. We have sent a new prescription for this as well.  4)  You are scheduled to see Dr Fuller Plan for follow up on 01/14/10 @ 2:45 pm.  5)  Please call our office sooner should you continue to have symptoms after your course of treatment. 6)  Copy sent to : [name], PA-C 7)  The medication list was reviewed and reconciled.  All changed / newly prescribed medications were explained.  A complete medication list was provided to the patient / caregiver. Prescriptions: HYDROCORTISONE 100 MG/60ML ENEM (HYDROCORTISONE) Insert 1 enema into rectum at bedtime as directed x 14 days  #14 x 0   Entered by:   Madlyn Frankel CMA (AAMA)   Authorized by:   Tye Savoy NP   Signed by:   Madlyn Frankel CMA  (AAMA) on 11/15/2009   Method used:   Electronically to        [facility] (retail)       [address], [place]  [address]       Ph: [phone]       Fax: [phone]   RxID:   [id number] COLAZAL 750 MG  CAPS (BALSALAZIDE DISODIUM) Take 2 capsules by mouth three times a day  #180 x 2   Entered by:   Madlyn Frankel CMA ([place])   Authorized by:   Tye Savoy NP   Signed by:   Madlyn Frankel CMA (AAMA) on 11/15/2009   Method used:   Electronically to        [facility] (retail)       [address]       [place], [place]  [address]       Ph: [phone]       Fax: [phone]   RxID:   [id number]

## 2010-08-29 NOTE — Progress Notes (Signed)
Summary: Mediation  Medications Added ASACOL 400 MG TBEC (MESALAMINE) 4 tablets by mouth three times a day       Phone Note Call from Patient Call back at [facility] Phone [phone]   Caller: Patient Call For: Dr. Fuller Plan Reason for Call: Talk to Nurse Summary of Call: Asacol is too expensive...needs an alternative med Initial call taken by: Webb Laws,  March 15, 2010 3:08 PM  Follow-up for Phone Call        Pt states her pharmacy told her that her prescription is 900 dollars and she cannot afford it. I told pt that I will call her insurance company b/c when she was in the office before, her insurance book stated they would cover this medication. [employer] and they state they will cover the Ascaol 461m and not the Asacol HD 8036m New Rx sent to the pharmacy and pt notified that we sent the prescription. Follow-up by: AmMarlon PelMA (ADeborra Medina  March 15, 2010 4:16 PM    New/Updated Medications: ASACOL 400 MG TBEC (MESALAMINE) 4 tablets by mouth three times a day Prescriptions: ASACOL 400 MG TBEC (MESALAMINE) 4 tablets by mouth three times a day  #360 x 11   Entered by:   AmMarlon PelMA ([place]  Authorized by:   MaLadene ArtistD [facility] Signed by:   AmMarlon PelMA (AALos Paneson 03/15/2010   Method used:   [facility]ronically to        [facility]       [address]     [place]  [id number]     Ph: [phone]     Fax: [phone] RxID:   [id number]

## 2010-08-29 NOTE — Assessment & Plan Note (Signed)
Summary: Rectal bleeding, mucus in stool, f/u UC/all   History of Present Illness Visit Type: Follow-up Visit Primary GI MD: Joylene Igo MD [facility] Primary Provider: [name], PA Requesting Provider: na Chief Complaint: Patient c/o 2 months rectal bleeding and mucous in stool. However, she has gotten better since beginning colazal. History of Present Illness:   Mrs. Kathleen Morales has noted a significant improvement in symptoms since resuming Colazal and discontinuing Asacol. She still notes occasional small amounts of blood and mucus several days per week however, these symptoms have substantially improved. She has one formed bowel movement each day.   GI Review of Systems      Denies abdominal pain, acid reflux, belching, bloating, chest pain, dysphagia with liquids, dysphagia with solids, heartburn, loss of appetite, nausea, vomiting, vomiting blood, weight loss, and  weight gain.        Denies anal fissure, black tarry stools, change in bowel habit, constipation, diarrhea, diverticulosis, fecal incontinence, heme positive stool, hemorrhoids, irritable bowel syndrome, jaundice, light color stool, liver problems, rectal bleeding, and  rectal pain.   Current Medications (verified): 1)  Colazal 750 Mg Caps (Balsalazide Disodium) .... 2 Tablet By Mouth Three Times A Day 2)  Calcium 1500 Mg Tabs (Calcium Carbonate) .... Take 1 Tablet By Mouth Once A Day 3)  Vitamin D 1000 Unit Caps (Cholecalciferol) .... Take 1 Tablet By Mouth Once A Day 4)  Prednisone 1 Mg Tabs (Prednisone) .... Take Two By Mouth Once Daily 5)  Vitamin B-12 1000 Mcg Tabs (Cyanocobalamin) .... Once Daily 6)  Biotin 1000 Mcg Tabs (Biotin) .... Once Daily 7)  Fish Oil 500 Mg Caps (Omega-3 Fatty Acids) .... Take One By Mouth Once Daily  Allergies (verified): No Known Drug Allergies  Past History:  Past Medical History: Reviewed history from 07/16/2009 and no changes required. Ulcerative Colitis since  1970 Diverticulosis Hemorrhoids Polymyalgia rheumatica Elevated [place] Spotted Fever titer 04/2006 Tubulovillous adenomatous colon polyps 07/1996  Past Surgical History: Hysterectomy Tubal Ligation Breast Cystectomy x 2-Right  Family History: Family History of Heart Disease: Father, Mother Family History of Breast Cancer: Sister, daughter, neice Family History of Colon Polyps: Nephew No FH of Colon Cancer:  Social History: Widowed Retired Patient has never smoked.  Daily Caffeine Use 1-2 cup/day Illicit Drug Use - no Alcohol Use -no Patient gets regular exercise.  Review of Systems       The patient complains of arthritis/joint pain, hearing problems, night sweats, and voice change.         The pertinent positives and negatives are noted as above and in the HPI. All other ROS were reviewed and were negative.   Vital Signs:  Patient profile:   77 year old female Height:      62 inches Weight:      142.50 pounds BMI:     26.16 BSA:     1.66 Pulse rate:   76 / minute Pulse rhythm:   regular BP sitting:   106 / 72  (left arm)  Vitals Entered By: Madlyn Frankel CMA Deborra Medina) (August 15, 2010 10:19 AM)  Physical Exam  General:  Well developed, well nourished, no acute distress. Head:  Normocephalic and atraumatic. Eyes:  PERRLA, no icterus. Mouth:  No deformity or lesions, dentition normal. Lungs:  Clear throughout to auscultation. Heart:  Regular rate and rhythm; no murmurs, rubs,  or bruits. Abdomen:  Soft, nontender and nondistended. No masses, hepatosplenomegaly or hernias noted. Normal bowel sounds. Psych:  Alert and cooperative. Normal  mood and affect.  Impression & Recommendations:  Problem # 1:  ULCERATIVE COLITIS-LEFT SIDE (ICD-556.5) Improved ulcerative colitis however still has mild symptoms. Increase Colazol to maximum dosege. Consider 5-ASA or steroid enemas pending response to the higher dose of Colazal. The risks, benefits and  alternatives to colonoscopy with possible biopsy and possible polypectomy were discussed with the patient and they consent to proceed. The procedure will be scheduled electively. Orders: Colonoscopy (Colon)  Problem # 2:  PERSONAL HX COLONIC POLYPS (ICD-V12.72) Personal history of tubulovillous adenomatous colon polyps initially diagnosed in 1998.  Colonoscopy as above. Orders: Colonoscopy (Colon)  Patient Instructions: 1)  Pick up your prep from your pharmacy and the Colazal prescription.  2)  Colonoscopy brochure given.  3)  Copy sent to : [name], PA 4)  The medication list was reviewed and reconciled.  All changed / newly prescribed medications were explained.  A complete medication list was provided to the patient / caregiver.  Prescriptions: MOVIPREP 100 GM  SOLR (PEG-KCL-NACL-NASULF-NA ASC-C) As per prep instructions.  #1 x 0   Entered by:   Marlon Pel CMA ([place])   Authorized by:   Ladene Artist MD [facility]   Signed by:   Marlon Pel CMA ([place]) on 08/15/2010   Method used:   Electronically to        [facility] (retail)       [address], [place]  [address]       Ph: [phone]       Fax: [phone]   RxID:   [id number] COLAZAL 750 MG CAPS (BALSALAZIDE DISODIUM) 3 tablet by mouth three times a day  #270 x 11   Entered by:   Marlon Pel CMA ([place])   Authorized by:   Ladene Artist MD [facility]   Signed by:   Marlon Pel CMA ([place]) on 08/15/2010   Method used:   Electronically to        [facility] (retail)       [address]       [place], [place]  [address]       Ph: [phone]       Fax: [phone]   RxID:   [id number]

## 2010-09-02 ENCOUNTER — Encounter: Payer: Self-pay | Admitting: Gastroenterology

## 2010-09-04 NOTE — Procedures (Addendum)
Summary: Colonoscopy  Patient: [name] Note: All result statuses are Final unless otherwise noted.  Tests: (1) Colonoscopy (COL)   COL Colonoscopy           [facility]     [place], [place]  [address]           COLONOSCOPY PROCEDURE REPORT           PATIENT:  Kathleen Morales, Kathleen Morales  MR#:  [id number]     BIRTHDATE:  [birth year], 82 yrs. old  GENDER:  female     ENDOSCOPIST:  Norberto Sorenson T. Fuller Plan, MD, [facility]           PROCEDURE DATE:  08/28/2010     PROCEDURE:  Colonoscopy with biopsy     ASA CLASS:  Class II     INDICATIONS:  1) surveillance and high-risk screening  2)     evaluation of ulcerative colitis  3) history of pre-cancerous     (adenomatous) colon polyps: tubulovillous adenoma, 1998.     MEDICATIONS:   Fentanyl 75 mcg IV, Versed 7 mg IV     DESCRIPTION OF PROCEDURE:   After the risks benefits and     alternatives of the procedure were thoroughly explained, informed     consent was obtained.  Digital rectal exam was performed and     revealed no abnormalities.   The LB [id number]-[id number] endoscope     was introduced through the anus and advanced to the cecum, which     was identified by both the appendix and ileocecal valve, without     limitations.  The quality of the prep was good, using MoviPrep.     The instrument was then slowly withdrawn as the colon was fully     examined.           <<PROCEDUREIMAGES>>           FINDINGS:  Colitis was found in the rectum and sigmoid colon. It     was erythematous, granular and mild. Multiple biopsies were     obtained and sent to pathology. Mild diverticulosis was found in     the sigmoid to descending colon. A normal appearing cecum,     ileocecal valve, and appendiceal orifice were identified. The     ascending, hepatic flexure, transverse, splenic flexure appeared     unremarkable. Random biopsies were obtained and sent to pathology.     Retroflexed views in the rectum revealed no  abnormalities. The     time to cecum =  2.5  minutes. The scope was then withdrawn (time     =  9.75  min) from the patient and the procedure completed.           COMPLICATIONS:  None           ENDOSCOPIC IMPRESSION:     1) Colitis in the rectum and sigmoid colon     2) Mild diverticulosis in the sigmoid to descending colon           RECOMMENDATIONS:     1) Await pathology results     2) High fiber diet with liberal fluid intake.     3) Repeat Colonoscopy in 2 years.           Pricilla Riffle. Fuller Plan, MD, [facility]           CC:  [name], PA  n.     eSIGNED:   Sherol Sabas T. Cisco Kindt at 08/28/2010 09:47 AM           [name], [id number]  Note: An exclamation mark (!) indicates a result that was not dispersed into the flowsheet. Document Creation Date: 08/28/2010 9:48 AM _______________________________________________________________________  (1) Order result status: Final Collection or observation date-time: 08/28/2010 08:57 Requested date-time:  Receipt date-time:  Reported date-time:  Referring Physician:   Ordering Physician: Lucio Edward [id number]) Specimen Source:  Source: Tawanna Cooler Order Number: [id number] Lab site:   Appended Document: Colonoscopy     Procedures Next Due Date:    Colonoscopy: 08/2012

## 2010-09-12 NOTE — Letter (Signed)
Summary: Patient Notice- Colon Biospy Results  [facility]  [address] [place], [place] [address]   Phone: [phone]  Fax: [phone]        September 02, 2010 MRN: [id number]    Kathleen Morales [place], [place]  [address]    Dear [name],  I am pleased to inform you that the biopsies taken during your recent colonoscopy did not show any evidence of cancer upon pathologic examination. The biopsies showed mildly active chronic proctosigmoiditis.  Continue with the treatment plan as outlined on the day of your      exam.  You should have a repeat colonoscopy examination for this problem           in 2 years.  Please call us if you are having persistent problems or have questions about your condition that have not been fully answered at this time.  Sincerely,  Ladene Artist MD [facility]  This letter has been electronically signed by your physician.  Appended Document: Patient Notice- Colon Biospy Results LETTER MAILED

## 2010-10-03 ENCOUNTER — Other Ambulatory Visit: Payer: Self-pay | Admitting: Dermatology

## 2010-12-10 NOTE — Assessment & Plan Note (Signed)
[facility]                           PRIMARY CARE OFFICE NOTE   NAME:Delfin, LYNCOLN MASKELL                    MRN:          [id number]  DATE:12/29/2006                            DOB:          1928-10-15    CHIEF COMPLAINT:  New patient to practice.   HISTORY OF PRESENT ILLNESS:  The patient is a 77 year old white female  here to establish primary care.  She was formerly followed by a Dr.  Keenan Bachelor in [place], [place].  She is also followed by Dr. Fuller Plan of  [facility] for a history of chronic ulcerative colitis.  She has been  very well controlled on her present regimen and has not had a flare-up.  She is noted to have a normal colonoscopy performed in August of 2007,  which revealed diverticulosis and internal hemorrhoids.  She has been  seen by Dr. Estanislado Pandy due to symptoms of arthralgia and polymyalgia.  A  [place] Spotted Fever titer was ordered, which was elevated.  She  was prescribed Doxycycline by her primary care physician, but her  symptoms did not improve.  Dr. Arlean Hopping impression was that her  symptoms were more consistent with polymyalgia rheumatica, and her  symptoms have significantly improved since taking prednisone.  She has  tapered to her current dose of 5 mg once a day.  She was also referred  to Dr. Beryle Beams by Dr. Estanislado Pandy for evaluation of elevated IgA serum  paraproteins.  Dr. Beryle Beams felt her IgA elevation likely due to a  nonspecific inflammatory response and an immunofiltration and  electrophoresis was ordered, as well as 24 hour urine.   She denies any history of high blood pressure, coronary artery disease,  diabetes, or lung disease.  She stays quite active.  Likes to work  outdoors, and has been in very good health overall.   She is followed by Dr. Deatra Ina, her gynecologist.  She was prescribed  Premarin in the past for hot flashes.  She has been on Premarin for many  years.  She has tried to wean  herself off, but has experienced hot  flashes mainly overnight.  She has a followup with Dr. Deatra Ina in  approximately 1 month's time.   PAST MEDICAL HISTORY SUMMARY:  1. Ulcerative colitis, quiescent.  2. Normal colonoscopy in 2007 besides diverticulosis and internal      hemorrhoids.  3. Polymyalgia rheumatica.  4. History of elevated [facility] Spotted Fever titer October of      2007.   CURRENT MEDICATIONS:  1. Colazal 750 mg 3 tablets a day.  2. Premarin 0.625 mg 1/2 a day.  3. Prednisone 5 mg once a day.  4. Tylenol p.r.n.   ALLERGIES TO MEDICATIONS:  No known drug allergies.   SOCIAL HISTORY:  The patient is widowed, but currently lives with a  significant other.  She is a retired housewife.  She has 3 children; 2  sons and a daughter.   FAMILY HISTORY:  She has 13 siblings.  Positive rheumatoid arthritis in  her mother.  Father died at age 39 secondary to a myocardial infarction.  HABITS:  No alcohol.  No tobacco.   REVIEW OF SYSTEMS:  No allergy symptoms.  Denies any chest pain,  shortness of breath, chronic cough, occasional heartburn.  She does note  some intermittent hoarseness.  No diarrhea or bloody stools.  All other  systems negative.   PHYSICAL EXAM:  VITAL SIGNS:  Weight is 141 pounds, temperature 97.4,  pulse of 70, BP 133/75.  GENERAL:  The patient is a very pleasant well-developed, well-nourished  77 year old white female in no apparent distress.  HEENT:  Normocephalic, atraumatic.  Pupils equal, round, and reactive to  light bilaterally.  Extraocular motility intact.  Anicteric.  Conjunctivae within normal limits.  Right cornea was somewhat hazy.  External auditory canals and tympanic membranes are clear bilaterally.  She uses a right hearing aid.  Oropharyngeal exam is unremarkable.  NECK:  Supple without any evidence of adenopathy, carotid bruit, or  thyromegaly.  CHEST:  Normal respiratory effort.  Clear to auscultation bilaterally.  No  rhonchi, rales, or wheezing.  CARDIOVASCULAR:  Regular rate and rhythm.  No significant murmurs, rubs,  or gallops appreciated.  ABDOMEN:  Soft and nontender.  Positive bowel sounds.  No organomegaly.  MUSCULOSKELETAL:  No cyanosis, clubbing, or edema.  SKIN:  Warm and dry.  NEUROLOGIC:  Cranial nerves 2-12 are grossly intact.  She was nonfocal.   IMPRESSION/RECOMMENDATIONS:  1. History of ulcerative colitis, quiescent/controlled.  2. Polymyalgia rheumatica.  3. History of positive [place] Spotted Fever titer.  4. History of diverticulosis.  5. Health maintenance.   RECOMMENDATIONS:  The patient is to continue her current regimen.  We  discussed the possibility of tapering off Premarin.  This will be  further discussed with her gynecologist, Dr. Deatra Ina.  She is to follow  up with Dr. Fuller Plan regarding her ulcerative colitis yearly or every 2  years.   We discussed adult vaccinations.  She declines Pneumovax.  She was  updated today with a tetanus booster.  She will consider Zostavax.   We will obtain screening labs and follow up on a yearly basis.     Sandy Salaam. Shawna [place], DO  Electronically Signed    RDY/MedQ  DD: 12/29/2006  DT: 12/29/2006  Job #: [id number]

## 2011-03-14 ENCOUNTER — Encounter: Payer: Self-pay | Admitting: Cardiovascular Disease

## 2011-03-17 ENCOUNTER — Ambulatory Visit (INDEPENDENT_AMBULATORY_CARE_PROVIDER_SITE_OTHER): Payer: Medicare Other | Admitting: Cardiovascular Disease

## 2011-03-17 ENCOUNTER — Encounter: Payer: Self-pay | Admitting: Cardiovascular Disease

## 2011-03-17 DIAGNOSIS — R0609 Other forms of dyspnea: Secondary | ICD-10-CM

## 2011-03-17 DIAGNOSIS — R0600 Dyspnea, unspecified: Secondary | ICD-10-CM | POA: Insufficient documentation

## 2011-03-17 DIAGNOSIS — I739 Peripheral vascular disease, unspecified: Secondary | ICD-10-CM | POA: Insufficient documentation

## 2011-03-17 DIAGNOSIS — I8390 Asymptomatic varicose veins of unspecified lower extremity: Secondary | ICD-10-CM

## 2011-03-17 DIAGNOSIS — R0602 Shortness of breath: Secondary | ICD-10-CM

## 2011-03-17 NOTE — Assessment & Plan Note (Signed)
Benign.  No active phlebitis.  Support hose and elevation as needed.  No indication for scleroRx at her age

## 2011-03-17 NOTE — Progress Notes (Signed)
The patient is a 77 -year-old white female here to evaluate dilated veins in legs and leg pain with dyspnea   Referred by Bo Merino.  Primary is Kathleen Morales in [place]  She was formerly followed by a Dr. Keenan Bachelor in [place], [place]. She is also followed by Dr. Fuller Plan of [facility] for a history of chronic ulcerative colitis. She has been very well controlled on her present regimen and has not had a flare-up. She is noted to have a normal colonoscopy performed in August of 2007, which revealed diverticulosis and internal hemorrhoids. She has been seen by Dr. Estanislado Pandy due to symptoms of arthralgia and polymyalgia. A [place] Spotted Fever titer was ordered, which was elevated. She was prescribed Doxycycline by her primary care physician, but her symptoms did not improve. Dr. Arlean Hopping impression was that her symptoms were more consistent with polymyalgia rheumatica, and her symptoms have significantly improved since taking prednisone. She has tapered to her current dose of 5 mg once a day. She was also referred to Dr. Beryle Beams by Dr. Estanislado Pandy for evaluation of elevated IgA serum paraproteins. Dr. Beryle Beams felt her IgA elevation likely due to a nonspecific inflammatory response and an immunofiltration and electrophoresis was ordered, as well as 24 hour urine. Recent evaluation consistant with Sjogrens.    Mild exertional dyspnea.  No PND or orhtopnea.  Worse in heat.  No chronic lung disease or cough.  Legs hurt.  Not likely claudication as pain is nonexertional.  More likely related to varicose veins and PMR   She denies any history of high blood pressure, coronary artery disease, diabetes, or lung disease. She stays quite active. Likes to work outdoors, and has been in very good health overall.  ROS: Denies fever, malais, weight loss, blurry vision, decreased visual acuity, cough, sputum, SOB, hemoptysis, pleuritic pain, palpitaitons, heartburn, abdominal pain, melena, lower  extremity edema, , or rash.  All other systems reviewed and negative   General: Affect appropriate Healthy:  appears stated age 49: normal Neck supple with no adenopathy JVP normal no bruits no thyromegaly Lungs clear with no wheezing and good diaphragmatic motion Heart:  S1/S2 no murmur,rub, gallop or click PMI normal Abdomen: benighn, BS positve, no tenderness, no AAA no bruit.  No HSM or HJR Distal pulses intact with no bruits No edema Neuro non-focal Skin warm and dry No muscular weakness Bilateral varicose veins  Medications Current Outpatient Prescriptions  Medication Sig Dispense Refill  . balsalazide (COLAZAL) 750 MG capsule Take 2,250 mg by mouth 3 (three) times daily.        . Biotin 1000 MCG tablet Take 1,000 mcg by mouth daily.        . Calcium Carbonate ([employer]S CALCIUM) 1500 MG TABS Take by mouth.        . Cholecalciferol (VITAMIN D) 1000 UNITS capsule Take 1,000 Units by mouth daily.        . Omega-3 Fatty Acids (FISH OIL) 500 MG CAPS Take 1 capsule by mouth daily.        . predniSONE (DELTASONE) 5 MG tablet daily. 1/2 tab po qd       . vitamin B-12 (CYANOCOBALAMIN) 1000 MCG tablet Take 1,000 mcg by mouth daily.          Allergies Review of patient's allergies indicates not on file.  Family History: Family History  Problem Relation Age of Onset  . Heart disease Father   . Heart disease Mother   . Breast cancer Sister   .  Breast cancer Daughter   . Breast cancer Other     neice  . Colon polyps Other     nephew    Social History: History   Social History  . Marital Status: Widowed    Spouse Name: N/A    Number of Children: N/A  . Years of Education: N/A   Occupational History  . Not on file.   Social History Main Topics  . Smoking status: Never Smoker   . Smokeless tobacco: Never Used  . Alcohol Use: No  . Drug Use: No  . Sexually Active: Not on file   Other Topics Concern  . Not on file   Social History Narrative   Daily caffeine  use 1-2 cup/dayGets regular exercise    Electrocardiogram:  NSR 71 nonspecific ST/T wave changes  Assessment and Plan

## 2011-03-17 NOTE — Assessment & Plan Note (Signed)
Pain in legs more likely related to PMR and varicose veins  Pulses ok on exam.  F/U ABI's

## 2011-03-17 NOTE — Assessment & Plan Note (Signed)
Functional. Normal cardiac exam and ECG  F/U echo

## 2011-03-17 NOTE — Patient Instructions (Signed)
Your physician has requested that you have an echocardiogram. Echocardiography is a painless test that uses sound waves to create images of your heart. It provides your doctor with information about the size and shape of your heart and how well your heart's chambers and valves are working. This procedure takes approximately one hour. There are no restrictions for this procedure.   Your physician has requested that you have a lower extremity arterial duplex. During this test, ultrasound is used to evaluate arterial blood flow in the legs. Allow one hour for this exam. There are no restrictions or special instructions.

## 2011-04-11 ENCOUNTER — Encounter (INDEPENDENT_AMBULATORY_CARE_PROVIDER_SITE_OTHER): Payer: Medicare Other | Admitting: *Deleted

## 2011-04-11 ENCOUNTER — Ambulatory Visit (HOSPITAL_COMMUNITY): Payer: Medicare Other | Attending: Cardiovascular Disease | Admitting: Radiology

## 2011-04-11 DIAGNOSIS — I739 Peripheral vascular disease, unspecified: Secondary | ICD-10-CM

## 2011-04-11 DIAGNOSIS — R0602 Shortness of breath: Secondary | ICD-10-CM | POA: Insufficient documentation

## 2011-04-11 DIAGNOSIS — I379 Nonrheumatic pulmonary valve disorder, unspecified: Secondary | ICD-10-CM | POA: Insufficient documentation

## 2011-04-11 DIAGNOSIS — I059 Rheumatic mitral valve disease, unspecified: Secondary | ICD-10-CM | POA: Insufficient documentation

## 2011-04-11 DIAGNOSIS — I079 Rheumatic tricuspid valve disease, unspecified: Secondary | ICD-10-CM | POA: Insufficient documentation

## 2011-04-22 ENCOUNTER — Telehealth: Payer: Self-pay | Admitting: Cardiovascular Disease

## 2011-05-08 ENCOUNTER — Ambulatory Visit (INDEPENDENT_AMBULATORY_CARE_PROVIDER_SITE_OTHER): Payer: Medicare Other | Admitting: Cardiovascular Disease

## 2011-05-08 ENCOUNTER — Encounter: Payer: Self-pay | Admitting: Cardiovascular Disease

## 2011-05-08 DIAGNOSIS — I8390 Asymptomatic varicose veins of unspecified lower extremity: Secondary | ICD-10-CM

## 2011-05-08 DIAGNOSIS — R0600 Dyspnea, unspecified: Secondary | ICD-10-CM

## 2011-05-08 DIAGNOSIS — R0989 Other specified symptoms and signs involving the circulatory and respiratory systems: Secondary | ICD-10-CM

## 2011-05-08 DIAGNOSIS — I739 Peripheral vascular disease, unspecified: Secondary | ICD-10-CM

## 2011-05-08 NOTE — Assessment & Plan Note (Signed)
Stable.  Elevate legs and minimize salt.  Compression hose as tolerated

## 2011-05-08 NOTE — Assessment & Plan Note (Signed)
No evidence of significant cardiopulmonary disease.  Observe for now

## 2011-05-08 NOTE — Progress Notes (Signed)
The patient is a 77 -year-old white female here to evaluate dilated veins in legs and leg pain with dyspnea Referred by Bo Merino. Primary is [name] in [place] She was formerly followed by a Dr. Keenan Bachelor in [place], [place]. She is also followed by Dr. Fuller Plan of [facility] for a history of chronic ulcerative colitis. She has been very well controlled on her present regimen and has not had a flare-up. She is noted to have a normal colonoscopy performed in August of 2007, which revealed diverticulosis and internal hemorrhoids. She has been seen by Dr. Estanislado Pandy due to symptoms of arthralgia and polymyalgia. A Rocky Mountain Spotted Fever titer was ordered, which was elevated. She was prescribed Doxycycline by her primary care physician, but her symptoms did not improve. Dr. Arlean Hopping impression was that her symptoms were more consistent with polymyalgia rheumatica, and her symptoms have significantly improved since taking prednisone. She has tapered to her current dose of 5 mg once a day. She was also referred to Dr. Beryle Beams by Dr. Estanislado Pandy for evaluation of elevated IgA serum paraproteins. Dr. Beryle Beams felt her IgA elevation likely due to a nonspecific inflammatory response and an immunofiltration and electrophoresis was ordered, as well as 24 hour urine. Recent evaluation consistant with Sjogrens.  Mild exertional dyspnea. No PND or orhtopnea. Worse in heat. No chronic lung disease or cough. Legs hurt. Not likely claudication as pain is nonexertional. More likely related to varicose veins and PMR  She denies any history of high blood pressure, coronary artery disease, diabetes, or lung disease. She stays quite active. Likes to work outdoors, and has been in very good health overall.  Reviewed ABI;s 9/14 normal Reviewed Echo 9/14  Normal EF 60% only abnormality moderate TR but peak velocity only 2.54msec with no evidence of pulmonary hypertension  ROS: Denies fever, malais, weight  loss, blurry vision, decreased visual acuity, cough, sputum, SOB, hemoptysis, pleuritic pain, palpitaitons, heartburn, abdominal pain, melena, lower extremity edema, claudication, or rash.  All other systems reviewed and negative  General: Affect appropriate Healthy:  appears stated age H60 normal Neck supple with no adenopathy JVP normal no bruits no thyromegaly Lungs clear with no wheezing and good diaphragmatic motion Heart:  S1/S2 no murmur,rub, gallop or click PMI normal Abdomen: benighn, BS positve, no tenderness, no AAA no bruit.  No HSM or HJR Distal pulses intact with no bruits No edema Neuro non-focal Skin warm and dry No muscular weakness   Current Outpatient Prescriptions  Medication Sig Dispense Refill  . balsalazide (COLAZAL) 750 MG capsule Take 2,250 mg by mouth 3 (three) times daily.        . Biotin 1000 MCG tablet Take 1,000 mcg by mouth daily.        . Calcium Carbonate (CVS CALCIUM) 1500 MG TABS Take 1 tablet by mouth daily.       . Cholecalciferol (VITAMIN D) 1000 UNITS capsule Take 1,000 Units by mouth daily.        .Marland Kitchenestradiol (ESTRACE) 0.5 MG tablet Take 0.5 mg by mouth daily.        . Omega-3 Fatty Acids (FISH OIL) 500 MG CAPS Take 1 capsule by mouth daily.        . predniSONE (DELTASONE) 5 MG tablet 1/2 tab po qd      . vitamin B-12 (CYANOCOBALAMIN) 1000 MCG tablet Take 1,000 mcg by mouth daily.          Allergies  Review of patient's allergies indicates no known allergies.  Electrocardiogram:  NSR 71 nonspecific T wave changes otherwise normal  Assessment and Plan

## 2011-05-08 NOTE — Assessment & Plan Note (Signed)
Leg pain likley related to PMR  ABI's are normal.  Observe

## 2011-05-08 NOTE — Patient Instructions (Signed)
Your physician wants you to follow-up in:  6 months. You will receive a reminder letter in the mail two months in advance. If you don't receive a letter, please call our office to schedule the follow-up appointment.   

## 2011-10-07 ENCOUNTER — Other Ambulatory Visit: Payer: Self-pay

## 2011-10-07 MED ORDER — BALSALAZIDE DISODIUM 750 MG PO CAPS: 2250.0000 mg | ORAL_CAPSULE | Freq: Three times a day (TID) | ORAL | Status: DC

## 2011-10-29 NOTE — Telephone Encounter (Signed)
Close  

## 2012-01-06 ENCOUNTER — Other Ambulatory Visit: Payer: Self-pay | Admitting: Gastroenterology

## 2012-01-06 MED ORDER — BALSALAZIDE DISODIUM 750 MG PO CAPS: 2250.0000 mg | ORAL_CAPSULE | Freq: Three times a day (TID) | ORAL | Status: DC

## 2012-01-06 NOTE — Telephone Encounter (Signed)
Pt was told she needs to schedule a office visit for any further refills. Pt agreed and scheduled an appt for 01/22/12. Told patient she must keep the appt to get any more refills. Pt agreed and verbalized understanding. Prescription sent to the pharmacy.

## 2012-01-19 ENCOUNTER — Other Ambulatory Visit: Payer: Self-pay | Admitting: Orthopaedic Surgery

## 2012-01-19 DIAGNOSIS — M545 Low back pain: Secondary | ICD-10-CM

## 2012-01-22 ENCOUNTER — Ambulatory Visit (INDEPENDENT_AMBULATORY_CARE_PROVIDER_SITE_OTHER): Payer: Medicare Other | Admitting: Gastroenterology

## 2012-01-22 ENCOUNTER — Encounter: Payer: Self-pay | Admitting: Gastroenterology

## 2012-01-22 VITALS — BP 130/72 | HR 64 | Ht 63.0 in | Wt 141.8 lb

## 2012-01-22 DIAGNOSIS — Z86010 Personal history of colonic polyps: Secondary | ICD-10-CM

## 2012-01-22 DIAGNOSIS — K5150 Left sided colitis without complications: Secondary | ICD-10-CM

## 2012-01-22 MED ORDER — BALSALAZIDE DISODIUM 750 MG PO CAPS: 1500.0000 mg | ORAL_CAPSULE | Freq: Three times a day (TID) | ORAL | Status: DC

## 2012-01-22 NOTE — Patient Instructions (Addendum)
We have sent the following medications to your pharmacy for you to pick up at your convenience:Colazal.

## 2012-01-22 NOTE — Progress Notes (Signed)
History of Present Illness: This is an 78 year old female with a history of left-sided ulcerative colitis and tubulovillous adenomatous colon polyps. She has no ongoing complaints and returns for routine followup colitis. Denies weight loss, abdominal pain, constipation, diarrhea, change in stool caliber, melena, hematochezia, nausea, vomiting, dysphagia, reflux symptoms, chest pain.   Current Medications, Allergies, Past Medical History, Past Surgical History, Family History and Social History were reviewed in [employer] record.  Physical Exam: General: Well developed , well nourished, no acute distress Head: Normocephalic and atraumatic Eyes:  sclerae anicteric, EOMI Ears: Normal auditory acuity Mouth: No deformity or lesions Lungs: Clear throughout to auscultation Heart: Regular rate and rhythm; no murmurs, rubs or bruits Abdomen: Soft, non tender and non distended. No masses, hepatosplenomegaly or hernias noted. Normal Bowel sounds Musculoskeletal: Symmetrical with no gross deformities  Pulses:  Normal pulses noted Extremities: No clubbing, cyanosis, edema or deformities noted Neurological: Alert oriented x 4, grossly nonfocal Psychological:  Alert and cooperative. Normal mood and affect  Assessment and Recommendations:  1. Left-sided colitis. Refill Colazal 1.5 g 3 times a day. Surveillance colonoscopy recommended February 2014.  2. History of tubulovillous adenomatous colon polyp. Surveillance colonoscopy as above.

## 2012-01-24 ENCOUNTER — Ambulatory Visit
Admission: RE | Admit: 2012-01-24 | Discharge: 2012-01-24 | Disposition: A | Discharge status: Home / Self Care | Payer: Medicare Other | Source: Ambulatory Visit | Attending: Orthopaedic Surgery | Admitting: Orthopaedic Surgery

## 2012-01-24 DIAGNOSIS — M545 Low back pain: Secondary | ICD-10-CM

## 2012-03-15 ENCOUNTER — Other Ambulatory Visit: Payer: Self-pay

## 2012-03-15 MED ORDER — BALSALAZIDE DISODIUM 750 MG PO CAPS: 2250.0000 mg | ORAL_CAPSULE | Freq: Three times a day (TID) | ORAL | Status: DC

## 2012-03-17 ENCOUNTER — Encounter: Payer: Self-pay | Admitting: Cardiovascular Disease

## 2012-03-17 ENCOUNTER — Ambulatory Visit (INDEPENDENT_AMBULATORY_CARE_PROVIDER_SITE_OTHER): Payer: Medicare Other | Admitting: Cardiovascular Disease

## 2012-03-17 VITALS — BP 129/82 | HR 66 | Resp 18 | Ht 62.0 in | Wt 137.1 lb

## 2012-03-17 DIAGNOSIS — I8390 Asymptomatic varicose veins of unspecified lower extremity: Secondary | ICD-10-CM

## 2012-03-17 DIAGNOSIS — Z01818 Encounter for other preprocedural examination: Secondary | ICD-10-CM

## 2012-03-17 NOTE — Progress Notes (Signed)
Patient ID: Kathleen Morales, female   DOB: 03/17/1929, 78 y.o.   MRN: [id number] The patient is a 78 -year-old white female here to evaluate dilated veins in legs and leg pain with dyspnea Referred by Kathleen Morales. Primary is Kathleen Morales in [place] She was formerly followed by a Dr. Keenan Morales in [place], [place]. She is also followed by Dr. Fuller Morales of [facility] for a history of chronic ulcerative colitis. She has been very well controlled on her present regimen and has not had a flare-up. She is noted to have a normal colonoscopy performed in August of 2007, which revealed diverticulosis and internal hemorrhoids. She has been seen by Dr. Estanislado Morales due to symptoms of arthralgia and polymyalgia. A Rocky Mountain Spotted Fever titer was ordered, which was elevated. She was prescribed Doxycycline by her primary care physician, but her symptoms did not improve. Dr. Arlean Morales impression was that her symptoms were more consistent with polymyalgia rheumatica, and her symptoms have significantly improved since taking prednisone. She has tapered to her current dose of 5 mg once a day. She was also referred to Dr. Beryle Morales by Dr. Estanislado Morales for evaluation of elevated IgA serum paraproteins. Dr. Beryle Morales felt her IgA elevation likely due to a nonspecific inflammatory response and an immunofiltration and electrophoresis was ordered, as well as 24 hour urine. Recent evaluation consistant with Sjogrens.  Mild exertional dyspnea. No PND or orhtopnea. Worse in heat. No chronic lung disease or cough. Legs hurt. Not likely claudication as pain is nonexertional. More likely related to varicose veins and PMR   She denies any history of high blood pressure, coronary artery disease, diabetes, or lung disease. She stays quite active. Likes to work outdoors, and has been in very good health overall.  Echo 04/11/11 Study Conclusions  - Left ventricle: The cavity size was normal. Wall thickness was normal. Systolic  function was normal. The estimated ejection fraction was in the range of 55% to 60%. Wall motion was normal; there were no regional wall motion abnormalities. Doppler parameters are consistent with abnormal left ventricular relaxation (grade 1 diastolic dysfunction). - Tricuspid valve: Moderate regurgitation.  Needs clearence for Left TKR with Dr Kathleen Morales.  Low risk  Cleared.  She will go to [facility] for rehab.  Needs meticulous post op DVT prophylaxis given superficial varicosities   ROS: Denies fever, malais, weight loss, blurry vision, decreased visual acuity, cough, sputum, SOB, hemoptysis, pleuritic pain, palpitaitons, heartburn, abdominal pain, melena, lower extremity edema, claudication, or rash.  All other systems reviewed and negative  General: Affect appropriate Healthy:  appears stated age 9: normal Neck supple with no adenopathy JVP normal no bruits no thyromegaly Lungs clear with no wheezing and good diaphragmatic motion Heart:  S1/S2 no murmur, no rub, gallop or click PMI normal Abdomen: benighn, BS positve, no tenderness, no AAA no bruit.  No HSM or HJR Distal pulses intact with no bruits Trace  Edema with marked superficial varicosities bilaterally Neuro non-focal Skin warm and dry No muscular weakness   Current Outpatient Prescriptions  Medication Sig Dispense Refill  . balsalazide (COLAZAL) 750 MG capsule Take 3 capsules (2,250 mg total) by mouth 3 (three) times daily.  270 capsule  11  . Biotin 1000 MCG tablet Take 1,000 mcg by mouth daily.        . Calcium Carbonate (CVS CALCIUM) 1500 MG TABS Take 1 tablet by mouth daily.       . Cholecalciferol (VITAMIN D) 1000 UNITS capsule Take 1,000 Units by mouth daily.        Marland Kitchen  estradiol (ESTRACE) 0.5 MG tablet Take 0.5 mg by mouth daily.        . Omega-3 Fatty Acids (FISH OIL) 500 MG CAPS Take 1 capsule by mouth daily.        . predniSONE (DELTASONE) 5 MG tablet 1/2 tab po qd      . vitamin B-12 (CYANOCOBALAMIN)  1000 MCG tablet Take 1,000 mcg by mouth daily.          Allergies  Review of patient's allergies indicates no known allergies.  Electrocardiogram:  NSR rate 67 normal ECG  Assessment and Morales

## 2012-03-17 NOTE — Assessment & Plan Note (Signed)
Support hose and elevation.  DVT prophylaxis post op with coumadin or xarelto.

## 2012-03-17 NOTE — Assessment & Plan Note (Signed)
Clear to have surgery from [facility] Form faxed to Dr Wonda Horner office

## 2012-03-17 NOTE — Patient Instructions (Signed)
Your physician wants you to follow-up in: YEAR WITH DR NISHAN  You will receive a reminder letter in the mail two months in advance. If you don't receive a letter, please call our office to schedule the follow-up appointment.  Your physician recommends that you continue on your current medications as directed. Please refer to the Current Medication list given to you today. 

## 2012-03-31 NOTE — H&P (Signed)
CHIEF COMPLAINT:  Painful left knee.   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 Chief complaint is that of a painful left knee which has been progressively worsening. She has been a patient of Dr. Arlean Hopping and on numerous occasions has had steroid injections as well as visco-supplementation. She has tried multiple anti-inflammatory medicines which has not really been beneficial. She is now to the point where she is having constant debilitating pain in the left knee with difficulty with activities of daily living. She also has some swelling and giving way with the knee. She has tried all the conservative treatment and has failed. She would like to be considered for a left total knee arthroplasty.  PAST MEDICAL HISTORY:   Past medical history and general health is good.  PAST SURGICAL HISTORY:    Surgeries have included that of vaginal hysterectomy 35 years ago. Bilateral cataract with lens implants. Bilateral carpal tunnel release. Childbirth x4.   CURRENT MEDICATION:   Prednisone 5 mg daily for osteoarthritis. Balsalazide 750 mg daily. Estradiol 0.5 mg daily. Calcium 1,000 mg daily. Biotin 1,000 mg daily. B12 1,000 mg daily.  Vitamin D 2,000 international units daily. She does use Vicodin and Robaxin p.r.n.   Allergies: None known.  REVIEW OF SYSTEMS:   A 14 point review of systems is positive for decreased hearing and uses hearing aids. She does wear glasses. She has had cataract surgery bilaterally. She does have nocturia 2-3 times an evening. She is complaining of some weight gain. Otherwise everything else has been negative.   FAMILY HISTORY:  Family history is positive for a mother who died at age 27 from congestive heart cardiac. Her father died at age 51 from myocardial infarction. She has had 4 brothers that have deceased, one from congestive heart failure, one CVA, one myocardial infarction, and one respiratory failure. She has 2 living  brothers, one 23 years of age and 60 years of age. She has 3 sisters who are deceased, one from a hip fracture, one from Parkinson's, and one just from old age. She has 2 living sisters. She has 3 living children age 82, 6 and 75. She had a 12 year old son who was killed in a motor vehicle accident.  SOCIAL HISTORY:  She is an 78 year old white female who is widowed. She is a retired housewife. She denies use of tobacco or alcohol.  PHYSICAL EXAM:  Examination today reveals a very pleasant 78 year old white female. Well-developed, well-nourished, alert, pleasant and cooperative.  She is 5 foot 2 inches and weighs 136 pounds. BMI is 24.9.  Vital signs reveals temperature 97.2, pulse 64, respirations 18, blood pressure 154/84.  Head is normocephalic. Ears, nose and throat were benign. Neck was supple, no bruits. Chest had good expansion. Lungs were essentially clear. Cardiac had a regular rhythm and rate. Normal S1-S2. Occasional rare ectopic. No murmurs. Abdomen was scaphoid, soft, nontender, no mass palpable. Normal bowel sounds present.  CNS: she is oriented x3 and cranial nerves II-XII were grossly intact. Genital, rectal and breast exams were not performed. Not indicated for an orthopedic evaluation. Musculoskeletal: range of motion from 3-4 degrees to that of 100 degrees. She has crepitus with range of motion. Positive effusion. She does have some pseudo-laxity with varus and valgus stressing. Calf is supple and nontender. She is neurovascularly intact distally.  X-RAYS: X-rays reveal lateral joint space narrowing with periarticular spurs. There are some cystic changes especially that of the lateral femoral condyle and the lateral  tibial plateau. She does have periarticular spurs noted diffusely in both knees.  CLINICAL IMPRESSION:   1.  End-stage OA left knee.  2.  Generalized OA.   recommendations: At this time we feel that she is a candidate for total joint replacement of the left  knee. Procedure risks and benefits were fully explained to her and she is understanding. Our plan is to consider going forward with a left total knee.

## 2012-04-01 ENCOUNTER — Encounter (HOSPITAL_COMMUNITY)
Admission: RE | Admit: 2012-04-01 | Discharge: 2012-04-01 | Disposition: A | Discharge status: Home / Self Care | Payer: Medicare Other | Source: Ambulatory Visit | Attending: Orthopedic Surgery | Admitting: Orthopedic Surgery

## 2012-04-01 ENCOUNTER — Encounter (HOSPITAL_COMMUNITY): Payer: Self-pay

## 2012-04-01 ENCOUNTER — Encounter (HOSPITAL_COMMUNITY)
Admission: RE | Admit: 2012-04-01 | Discharge: 2012-04-01 | Disposition: A | Discharge status: Home / Self Care | Payer: Medicare Other | Source: Ambulatory Visit | Attending: Orthopaedic Surgery | Admitting: Orthopaedic Surgery

## 2012-04-01 HISTORY — DX: Personal history of colon polyps, unspecified: Z86.0100

## 2012-04-01 HISTORY — DX: Gastric ulcer, unspecified as acute or chronic, without hemorrhage or perforation: K25.9

## 2012-04-01 HISTORY — DX: Spontaneous ecchymoses: R23.3

## 2012-04-01 HISTORY — DX: Effusion, unspecified joint: M25.40

## 2012-04-01 HISTORY — DX: Pain in unspecified joint: M25.50

## 2012-04-01 HISTORY — DX: Unspecified osteoarthritis, unspecified site: M19.90

## 2012-04-01 HISTORY — DX: Dysphonia: R49.0

## 2012-04-01 HISTORY — DX: Other skin changes: R23.8

## 2012-04-01 HISTORY — DX: Nocturia: R35.1

## 2012-04-01 HISTORY — DX: Personal history of colonic polyps: Z86.010

## 2012-04-01 LAB — URINE MICROSCOPIC-ADD ON

## 2012-04-01 LAB — COMPREHENSIVE METABOLIC PANEL
ALT: 14 U/L (ref 0–35)
AST: 24 U/L (ref 0–37)
Alkaline Phosphatase: 72 U/L (ref 39–117)
CO2: 29 mEq/L (ref 19–32)
Calcium: 9.9 mg/dL (ref 8.4–10.5)
Potassium: 4 mEq/L (ref 3.5–5.1)
Sodium: 139 mEq/L (ref 135–145)
Total Protein: 6.6 g/dL (ref 6.0–8.3)

## 2012-04-01 LAB — URINALYSIS, ROUTINE W REFLEX MICROSCOPIC
Bilirubin Urine: NEGATIVE
Nitrite: NEGATIVE
Specific Gravity, Urine: 1.031 — ABNORMAL HIGH (ref 1.005–1.030)
pH: 5 (ref 5.0–8.0)

## 2012-04-01 LAB — APTT: aPTT: 29 seconds (ref 24–37)

## 2012-04-01 LAB — CBC
MCH: 33.6 pg (ref 26.0–34.0)
MCHC: 33.3 g/dL (ref 30.0–36.0)
Platelets: 327 10*3/uL (ref 150–400)
RBC: 4.25 MIL/uL (ref 3.87–5.11)

## 2012-04-01 LAB — TYPE AND SCREEN

## 2012-04-01 MED ORDER — CHLORHEXIDINE GLUCONATE 4 % EX LIQD: 60.0000 mL | Freq: Once | CUTANEOUS | Status: DC

## 2012-04-01 MED ORDER — CHLORHEXIDINE GLUCONATE 4 % EX LIQD: 60.0000 mL | Freq: Every day | CUTANEOUS | Status: DC

## 2012-04-01 NOTE — Pre-Procedure Instructions (Signed)
20 [name]  04/01/2012   Your procedure is scheduled on:  Tues, Sept 10 @ 10:38 AM  Report to [facility] at 8:30 AM.  Call this number if you have problems the morning of surgery: [phone]   Remember:   Do not eat food:After Midnight.    Take these medicines the morning of surgery with A SIP OF WATER: Tramadol(Ultram)   Do not wear jewelry, make-up or nail polish.  Do not wear lotions, powders, or perfumes.   Do not shave 48 hours prior to surgery.   Do not bring valuables to the hospital.  Contacts, dentures or bridgework may not be worn into surgery.  Leave suitcase in the car. After surgery it may be brought to your room.  For patients admitted to the hospital, checkout time is 11:00 AM the day of discharge.   Patients discharged the day of surgery will not be allowed to drive home.  Special Instructions: Incentive Spirometry - Practice and bring it with you on the day of surgery. and CHG Shower Use Special Wash: 1/2 bottle night before surgery and 1/2 bottle morning of surgery.   Please read over the following fact sheets that you were given: Pain Booklet, Coughing and Deep Breathing, Blood Transfusion Information, Total Joint Packet, MRSA Information and Surgical Site Infection Prevention

## 2012-04-01 NOTE — Progress Notes (Signed)
Dr.Peter Johnsie Cancel is cardiologist last visit on Aug 21  Denies ever having a stress test or heart cath  Echo report in epic from 2012  EKG in epic from 03/17/12  Denies CXR being done within the past yr

## 2012-04-03 LAB — URINE CULTURE

## 2012-04-05 DIAGNOSIS — M1710 Unilateral primary osteoarthritis, unspecified knee: Secondary | ICD-10-CM | POA: Diagnosis present

## 2012-04-05 MED ORDER — CEFAZOLIN SODIUM-DEXTROSE 2-3 GM-% IV SOLR
2.0000 g | INTRAVENOUS | Status: AC
  Administered 2012-04-06: 2 g via INTRAVENOUS
  Filled 2012-04-05: qty 50

## 2012-04-06 ENCOUNTER — Encounter (HOSPITAL_COMMUNITY): Payer: Self-pay | Admitting: Certified Registered"

## 2012-04-06 ENCOUNTER — Inpatient Hospital Stay (HOSPITAL_COMMUNITY): Payer: Medicare Other | Admitting: Certified Registered"

## 2012-04-06 ENCOUNTER — Encounter (HOSPITAL_COMMUNITY)
Admission: RE | Disposition: A | Discharge status: Skilled Nursing Facility | Payer: Self-pay | Source: Ambulatory Visit | Attending: Orthopaedic Surgery

## 2012-04-06 ENCOUNTER — Inpatient Hospital Stay (HOSPITAL_COMMUNITY)
Admission: RE | Admit: 2012-04-06 | Discharge: 2012-04-08 | DRG: 470 | Disposition: A | Discharge status: Skilled Nursing Facility | Payer: Medicare Other | Source: Ambulatory Visit | Attending: Orthopaedic Surgery | Admitting: Orthopaedic Surgery

## 2012-04-06 ENCOUNTER — Encounter (HOSPITAL_COMMUNITY): Payer: Self-pay | Admitting: *Deleted

## 2012-04-06 DIAGNOSIS — IMO0001 Reserved for inherently not codable concepts without codable children: Secondary | ICD-10-CM | POA: Diagnosis present

## 2012-04-06 DIAGNOSIS — M1710 Unilateral primary osteoarthritis, unspecified knee: Principal | ICD-10-CM | POA: Diagnosis present

## 2012-04-06 DIAGNOSIS — M179 Osteoarthritis of knee, unspecified: Secondary | ICD-10-CM | POA: Diagnosis present

## 2012-04-06 DIAGNOSIS — IMO0002 Reserved for concepts with insufficient information to code with codable children: Principal | ICD-10-CM | POA: Diagnosis present

## 2012-04-06 HISTORY — PX: TOTAL KNEE ARTHROPLASTY: SHX125

## 2012-04-06 HISTORY — DX: Fibromyalgia: M79.7

## 2012-04-06 SURGERY — ARTHROPLASTY, KNEE, TOTAL
Anesthesia: General | Site: Knee | Laterality: Left | Wound class: Clean

## 2012-04-06 MED ORDER — HYDROMORPHONE HCL PF 1 MG/ML IJ SOLN
0.5000 mg | INTRAMUSCULAR | Status: DC | PRN
  Administered 2012-04-06 – 2012-04-07 (×2): 0.5 mg via INTRAVENOUS
  Filled 2012-04-06 (×2): qty 1

## 2012-04-06 MED ORDER — ALUM & MAG HYDROXIDE-SIMETH 200-200-20 MG/5ML PO SUSP: 30.0000 mL | ORAL | Status: DC | PRN

## 2012-04-06 MED ORDER — LIDOCAINE HCL (CARDIAC) 20 MG/ML IV SOLN
INTRAVENOUS | Status: DC | PRN
  Administered 2012-04-06: 60 mg via INTRAVENOUS

## 2012-04-06 MED ORDER — KETOROLAC TROMETHAMINE 15 MG/ML IJ SOLN
7.5000 mg | Freq: Four times a day (QID) | INTRAMUSCULAR | Status: AC
  Administered 2012-04-06 – 2012-04-07 (×2): 7.5 mg via INTRAVENOUS
  Filled 2012-04-06 (×2): qty 1

## 2012-04-06 MED ORDER — SODIUM CHLORIDE 0.9 % IV SOLN: INTRAVENOUS | Status: DC

## 2012-04-06 MED ORDER — ONDANSETRON HCL 4 MG/2ML IJ SOLN
4.0000 mg | Freq: Four times a day (QID) | INTRAMUSCULAR | Status: DC | PRN
  Administered 2012-04-06: 4 mg via INTRAVENOUS
  Filled 2012-04-06 (×2): qty 2

## 2012-04-06 MED ORDER — MENTHOL 3 MG MT LOZG: 1.0000 | LOZENGE | OROMUCOSAL | Status: DC | PRN

## 2012-04-06 MED ORDER — HYDROMORPHONE HCL PF 1 MG/ML IJ SOLN
0.2500 mg | INTRAMUSCULAR | Status: DC | PRN
  Administered 2012-04-06 (×3): 0.5 mg via INTRAVENOUS

## 2012-04-06 MED ORDER — LACTATED RINGERS IV SOLN
INTRAVENOUS | Status: DC
  Administered 2012-04-06: 10:00:00 via INTRAVENOUS

## 2012-04-06 MED ORDER — FLEET ENEMA 7-19 GM/118ML RE ENEM: 1.0000 | ENEMA | Freq: Once | RECTAL | Status: AC | PRN

## 2012-04-06 MED ORDER — METOCLOPRAMIDE HCL 10 MG PO TABS: 5.0000 mg | ORAL_TABLET | Freq: Three times a day (TID) | ORAL | Status: DC | PRN

## 2012-04-06 MED ORDER — VITAMIN B-12 1000 MCG PO TABS
1000.0000 ug | ORAL_TABLET | Freq: Every day | ORAL | Status: DC
  Administered 2012-04-06 – 2012-04-08 (×3): 1000 ug via ORAL
  Filled 2012-04-06 (×3): qty 1

## 2012-04-06 MED ORDER — ACETAMINOPHEN 10 MG/ML IV SOLN: 1000.0000 mg | Freq: Once | INTRAVENOUS | Status: DC

## 2012-04-06 MED ORDER — CEFAZOLIN SODIUM 1-5 GM-% IV SOLN
1.0000 g | Freq: Four times a day (QID) | INTRAVENOUS | Status: AC
Start: 2012-04-06 — End: 2012-04-06
  Administered 2012-04-06 (×2): 1 g via INTRAVENOUS
  Filled 2012-04-06 (×2): qty 50

## 2012-04-06 MED ORDER — METOCLOPRAMIDE HCL 5 MG/ML IJ SOLN: 5.0000 mg | Freq: Three times a day (TID) | INTRAMUSCULAR | Status: DC | PRN

## 2012-04-06 MED ORDER — METHOCARBAMOL 100 MG/ML IJ SOLN
500.0000 mg | Freq: Four times a day (QID) | INTRAVENOUS | Status: DC | PRN
  Administered 2012-04-06: 500 mg via INTRAVENOUS
  Filled 2012-04-06: qty 5

## 2012-04-06 MED ORDER — ROCURONIUM BROMIDE 100 MG/10ML IV SOLN
INTRAVENOUS | Status: DC | PRN
  Administered 2012-04-06: 10 mg via INTRAVENOUS
  Administered 2012-04-06: 40 mg via INTRAVENOUS

## 2012-04-06 MED ORDER — ACETAMINOPHEN 10 MG/ML IV SOLN
INTRAVENOUS | Status: AC
  Filled 2012-04-06: qty 100

## 2012-04-06 MED ORDER — BUPIVACAINE-EPINEPHRINE 0.25% -1:200000 IJ SOLN
INTRAMUSCULAR | Status: DC | PRN
  Administered 2012-04-06: 20 mL

## 2012-04-06 MED ORDER — BALSALAZIDE DISODIUM 750 MG PO CAPS
1500.0000 mg | ORAL_CAPSULE | Freq: Two times a day (BID) | ORAL | Status: DC
  Administered 2012-04-06 – 2012-04-08 (×4): 1500 mg via ORAL
  Filled 2012-04-06 (×5): qty 2

## 2012-04-06 MED ORDER — BUPIVACAINE-EPINEPHRINE PF 0.25-1:200000 % IJ SOLN
INTRAMUSCULAR | Status: AC
  Filled 2012-04-06: qty 30

## 2012-04-06 MED ORDER — OXYCODONE HCL 5 MG PO TABS
5.0000 mg | ORAL_TABLET | ORAL | Status: DC | PRN
  Administered 2012-04-07 (×3): 10 mg via ORAL
  Administered 2012-04-08: 5 mg via ORAL
  Administered 2012-04-08: 10 mg via ORAL
  Filled 2012-04-06 (×3): qty 1
  Filled 2012-04-06 (×2): qty 2
  Filled 2012-04-06: qty 1

## 2012-04-06 MED ORDER — DOCUSATE SODIUM 100 MG PO CAPS
100.0000 mg | ORAL_CAPSULE | Freq: Two times a day (BID) | ORAL | Status: DC
  Administered 2012-04-06 – 2012-04-08 (×4): 100 mg via ORAL
  Filled 2012-04-06 (×5): qty 1

## 2012-04-06 MED ORDER — ACETAMINOPHEN 10 MG/ML IV SOLN
1000.0000 mg | Freq: Four times a day (QID) | INTRAVENOUS | Status: AC
  Administered 2012-04-06 – 2012-04-07 (×4): 1000 mg via INTRAVENOUS
  Filled 2012-04-06 (×4): qty 100

## 2012-04-06 MED ORDER — MAGNESIUM HYDROXIDE 400 MG/5ML PO SUSP: 30.0000 mL | Freq: Every day | ORAL | Status: DC | PRN

## 2012-04-06 MED ORDER — NEOSTIGMINE METHYLSULFATE 1 MG/ML IJ SOLN
INTRAMUSCULAR | Status: DC | PRN
  Administered 2012-04-06: 3 mg via INTRAVENOUS

## 2012-04-06 MED ORDER — BISACODYL 10 MG RE SUPP: 10.0000 mg | Freq: Every day | RECTAL | Status: DC | PRN

## 2012-04-06 MED ORDER — BIOTIN 1000 MCG PO TABS: 1000.0000 ug | ORAL_TABLET | Freq: Every day | ORAL | Status: DC

## 2012-04-06 MED ORDER — ACETAMINOPHEN 10 MG/ML IV SOLN
INTRAVENOUS | Status: DC | PRN
  Administered 2012-04-06: 1000 mg via INTRAVENOUS

## 2012-04-06 MED ORDER — MIDAZOLAM HCL 2 MG/2ML IJ SOLN
INTRAMUSCULAR | Status: AC
  Filled 2012-04-06: qty 2

## 2012-04-06 MED ORDER — METHOCARBAMOL 500 MG PO TABS
500.0000 mg | ORAL_TABLET | Freq: Four times a day (QID) | ORAL | Status: DC | PRN
  Filled 2012-04-06: qty 1

## 2012-04-06 MED ORDER — ONDANSETRON HCL 4 MG PO TABS: 4.0000 mg | ORAL_TABLET | Freq: Four times a day (QID) | ORAL | Status: DC | PRN

## 2012-04-06 MED ORDER — PROPOFOL 10 MG/ML IV BOLUS
INTRAVENOUS | Status: DC | PRN
  Administered 2012-04-06: 150 mg via INTRAVENOUS

## 2012-04-06 MED ORDER — ESTRADIOL 1 MG PO TABS
0.5000 mg | ORAL_TABLET | Freq: Every day | ORAL | Status: DC
  Administered 2012-04-06 – 2012-04-08 (×3): 0.5 mg via ORAL
  Filled 2012-04-06 (×3): qty 0.5

## 2012-04-06 MED ORDER — LACTATED RINGERS IV SOLN
INTRAVENOUS | Status: DC | PRN
  Administered 2012-04-06 (×3): via INTRAVENOUS

## 2012-04-06 MED ORDER — LABETALOL HCL 5 MG/ML IV SOLN
INTRAVENOUS | Status: DC | PRN
  Administered 2012-04-06 (×2): 5 mg via INTRAVENOUS

## 2012-04-06 MED ORDER — FENTANYL CITRATE 0.05 MG/ML IJ SOLN
INTRAMUSCULAR | Status: DC | PRN
  Administered 2012-04-06: 25 ug via INTRAVENOUS
  Administered 2012-04-06: 50 ug via INTRAVENOUS
  Administered 2012-04-06: 25 ug via INTRAVENOUS
  Administered 2012-04-06: 50 ug via INTRAVENOUS
  Administered 2012-04-06: 25 ug via INTRAVENOUS
  Administered 2012-04-06 (×3): 50 ug via INTRAVENOUS

## 2012-04-06 MED ORDER — SODIUM CHLORIDE 0.9 % IR SOLN
Status: DC | PRN
  Administered 2012-04-06: 3000 mL

## 2012-04-06 MED ORDER — MIDAZOLAM HCL 2 MG/2ML IJ SOLN
1.0000 mg | INTRAMUSCULAR | Status: DC | PRN
  Administered 2012-04-06: 2 mg via INTRAVENOUS

## 2012-04-06 MED ORDER — LABETALOL HCL 5 MG/ML IV SOLN
INTRAVENOUS | Status: AC
  Filled 2012-04-06: qty 4

## 2012-04-06 MED ORDER — GLYCOPYRROLATE 0.2 MG/ML IJ SOLN
INTRAMUSCULAR | Status: DC | PRN
  Administered 2012-04-06: 0.4 mg via INTRAVENOUS

## 2012-04-06 MED ORDER — HYDROMORPHONE HCL PF 1 MG/ML IJ SOLN
INTRAMUSCULAR | Status: AC
  Administered 2012-04-06: 0.5 mg via INTRAVENOUS
  Filled 2012-04-06: qty 1

## 2012-04-06 MED ORDER — FENTANYL CITRATE 0.05 MG/ML IJ SOLN
INTRAMUSCULAR | Status: AC
  Filled 2012-04-06: qty 2

## 2012-04-06 MED ORDER — SODIUM CHLORIDE 0.9 % IV SOLN
75.0000 mL/h | INTRAVENOUS | Status: DC
  Administered 2012-04-06: 75 mL/h via INTRAVENOUS

## 2012-04-06 MED ORDER — FENTANYL CITRATE 0.05 MG/ML IJ SOLN
50.0000 ug | INTRAMUSCULAR | Status: DC | PRN
  Administered 2012-04-06: 75 ug via INTRAVENOUS

## 2012-04-06 MED ORDER — PHENOL 1.4 % MT LIQD: 1.0000 | OROMUCOSAL | Status: DC | PRN

## 2012-04-06 MED ORDER — PHENYLEPHRINE HCL 10 MG/ML IJ SOLN
INTRAMUSCULAR | Status: DC | PRN
  Administered 2012-04-06: 40 ug via INTRAVENOUS

## 2012-04-06 MED ORDER — LIDOCAINE HCL 4 % MT SOLN
OROMUCOSAL | Status: DC | PRN
  Administered 2012-04-06: 4 mL via TOPICAL

## 2012-04-06 MED ORDER — PREDNISONE 5 MG PO TABS
5.0000 mg | ORAL_TABLET | Freq: Every day | ORAL | Status: DC
  Administered 2012-04-07 – 2012-04-08 (×2): 5 mg via ORAL
  Filled 2012-04-06 (×3): qty 1

## 2012-04-06 MED ORDER — ONDANSETRON HCL 4 MG/2ML IJ SOLN
INTRAMUSCULAR | Status: DC | PRN
  Administered 2012-04-06: 4 mg via INTRAVENOUS

## 2012-04-06 MED ORDER — MIDAZOLAM HCL 5 MG/5ML IJ SOLN
INTRAMUSCULAR | Status: DC | PRN
  Administered 2012-04-06: 1 mg via INTRAVENOUS

## 2012-04-06 MED ORDER — HYDROCORTISONE SOD SUCCINATE 100 MG IJ SOLR
100.0000 mg | Freq: Once | INTRAMUSCULAR | Status: AC
  Administered 2012-04-06: 100 mg via INTRAVENOUS
  Filled 2012-04-06: qty 2

## 2012-04-06 MED ORDER — RIVAROXABAN 10 MG PO TABS
10.0000 mg | ORAL_TABLET | ORAL | Status: DC
  Administered 2012-04-07: 10 mg via ORAL
  Filled 2012-04-06 (×2): qty 1

## 2012-04-06 SURGICAL SUPPLY — 65 items
BANDAGE ELASTIC 6 VELCRO ST LF (GAUZE/BANDAGES/DRESSINGS) ×2 IMPLANT
BANDAGE ESMARK 6X9 LF (GAUZE/BANDAGES/DRESSINGS) ×1 IMPLANT
BLADE SAGITTAL 25.0X1.19X90 (BLADE) ×2 IMPLANT
BNDG ESMARK 6X9 LF (GAUZE/BANDAGES/DRESSINGS) ×2
BOWL SMART MIX CTS (DISPOSABLE) ×2 IMPLANT
CEMENT HV SMART SET (Cement) ×4 IMPLANT
CLOTH BEACON ORANGE TIMEOUT ST (SAFETY) ×2 IMPLANT
COVER BACK TABLE 24X17X13 BIG (DRAPES) ×2 IMPLANT
COVER SURGICAL LIGHT HANDLE (MISCELLANEOUS) ×2 IMPLANT
CUFF TOURNIQUET SINGLE 34IN LL (TOURNIQUET CUFF) ×2 IMPLANT
CUFF TOURNIQUET SINGLE 44IN (TOURNIQUET CUFF) IMPLANT
DRAPE EXTREMITY T 121X128X90 (DRAPE) ×2 IMPLANT
DRAPE PROXIMA HALF (DRAPES) ×2 IMPLANT
DRSG ADAPTIC 3X8 NADH LF (GAUZE/BANDAGES/DRESSINGS) ×2 IMPLANT
DRSG PAD ABDOMINAL 8X10 ST (GAUZE/BANDAGES/DRESSINGS) ×4 IMPLANT
DURAPREP 26ML APPLICATOR (WOUND CARE) ×2 IMPLANT
ELECT CAUTERY BLADE 6.4 (BLADE) ×2 IMPLANT
ELECT REM PT RETURN 9FT ADLT (ELECTROSURGICAL) ×2
ELECTRODE REM PT RTRN 9FT ADLT (ELECTROSURGICAL) ×1 IMPLANT
EVACUATOR 1/8 PVC DRAIN (DRAIN) IMPLANT
FACESHIELD LNG OPTICON STERILE (SAFETY) ×4 IMPLANT
FLOSEAL 10ML (HEMOSTASIS) IMPLANT
GAUZE XEROFORM 5X9 LF (GAUZE/BANDAGES/DRESSINGS) ×2 IMPLANT
GLOVE BIO SURGEON STRL SZ7 (GLOVE) ×4 IMPLANT
GLOVE BIOGEL PI IND STRL 6.5 (GLOVE) ×2 IMPLANT
GLOVE BIOGEL PI IND STRL 8 (GLOVE) ×1 IMPLANT
GLOVE BIOGEL PI IND STRL 8.5 (GLOVE) ×2 IMPLANT
GLOVE BIOGEL PI INDICATOR 6.5 (GLOVE) ×2
GLOVE BIOGEL PI INDICATOR 8 (GLOVE) ×1
GLOVE BIOGEL PI INDICATOR 8.5 (GLOVE) ×2
GLOVE ECLIPSE 8.0 STRL XLNG CF (GLOVE) ×4 IMPLANT
GLOVE SURG ORTHO 8.5 STRL (GLOVE) ×2 IMPLANT
GLOVE SURG SS PI 6.5 STRL IVOR (GLOVE) ×2 IMPLANT
GOWN PREVENTION PLUS XXLARGE (GOWN DISPOSABLE) ×2 IMPLANT
GOWN STRL NON-REIN LRG LVL3 (GOWN DISPOSABLE) ×6 IMPLANT
HANDPIECE INTERPULSE COAX TIP (DISPOSABLE) ×1
KIT BASIN OR (CUSTOM PROCEDURE TRAY) ×2 IMPLANT
KIT ROOM TURNOVER OR (KITS) ×2 IMPLANT
MANIFOLD NEPTUNE II (INSTRUMENTS) ×2 IMPLANT
MARKER SPHERE PSV REFLC THRD 5 (MARKER) IMPLANT
NEEDLE 22X1 1/2 (OR ONLY) (NEEDLE) IMPLANT
NS IRRIG 1000ML POUR BTL (IV SOLUTION) ×2 IMPLANT
PACK TOTAL JOINT (CUSTOM PROCEDURE TRAY) ×2 IMPLANT
PAD ARMBOARD 7.5X6 YLW CONV (MISCELLANEOUS) ×4 IMPLANT
PAD CAST 4YDX4 CTTN HI CHSV (CAST SUPPLIES) ×1 IMPLANT
PADDING CAST COTTON 4X4 STRL (CAST SUPPLIES) ×1
PADDING CAST COTTON 6X4 STRL (CAST SUPPLIES) ×2 IMPLANT
PIN SCHANZ 4MM 130MM (PIN) IMPLANT
SET HNDPC FAN SPRY TIP SCT (DISPOSABLE) ×1 IMPLANT
SPONGE GAUZE 4X4 12PLY (GAUZE/BANDAGES/DRESSINGS) ×2 IMPLANT
STAPLER VISISTAT 35W (STAPLE) ×2 IMPLANT
SUCTION FRAZIER TIP 10 FR DISP (SUCTIONS) ×2 IMPLANT
SUT BONE WAX W31G (SUTURE) ×2 IMPLANT
SUT ETHIBOND NAB CT1 #1 30IN (SUTURE) ×6 IMPLANT
SUT MNCRL AB 3-0 PS2 18 (SUTURE) ×2 IMPLANT
SUT VIC AB 0 CT1 27 (SUTURE) ×1
SUT VIC AB 0 CT1 27XBRD ANBCTR (SUTURE) ×1 IMPLANT
SUT VIC AB 1 CT1 27 (SUTURE) ×1
SUT VIC AB 1 CT1 27XBRD ANBCTR (SUTURE) ×1 IMPLANT
SYR CONTROL 10ML LL (SYRINGE) IMPLANT
TOWEL OR 17X24 6PK STRL BLUE (TOWEL DISPOSABLE) ×2 IMPLANT
TOWEL OR 17X26 10 PK STRL BLUE (TOWEL DISPOSABLE) ×2 IMPLANT
TRAY FOLEY CATH 14FR (SET/KITS/TRAYS/PACK) IMPLANT
WATER STERILE IRR 1000ML POUR (IV SOLUTION) ×6 IMPLANT
WRAP KNEE MAXI GEL POST OP (GAUZE/BANDAGES/DRESSINGS) ×2 IMPLANT

## 2012-04-06 NOTE — Progress Notes (Signed)
UR COMPLETED  

## 2012-04-06 NOTE — Anesthesia Preprocedure Evaluation (Addendum)
Anesthesia Evaluation  Patient identified by MRN, date of birth, ID band  Reviewed: Allergy & Precautions, H&P , NPO status , Patient's Chart, lab work & pertinent test results, reviewed documented beta blocker date and time   Airway Mallampati: I TM Distance: >3 FB Neck ROM: Full    Dental  (+) Teeth Intact and Dental Advisory Given   Pulmonary shortness of breath and with exertion,  breath sounds clear to auscultation        Cardiovascular negative cardio ROS  Rhythm:Regular Rate:Normal     Neuro/Psych    GI/Hepatic Neg liver ROS, PUD,   Endo/Other  negative endocrine ROS  Renal/GU negative Renal ROS     Musculoskeletal   Abdominal (+)  Abdomen: soft. Bowel sounds: normal.  Peds  Hematology negative hematology ROS (+)   Anesthesia Other Findings   Reproductive/Obstetrics                        Anesthesia Physical Anesthesia Plan  ASA: II  Anesthesia Plan: General   Post-op Pain Management:    Induction: Intravenous  Airway Management Planned: Oral ETT  Additional Equipment:   Intra-op Plan:   Post-operative Plan: Extubation in OR  Informed Consent: I have reviewed the patients History and Physical, chart, labs and discussed the procedure including the risks, benefits and alternatives for the proposed anesthesia with the patient or authorized representative who has indicated his/her understanding and acceptance.     Plan Discussed with: CRNA and Anesthesiologist  Anesthesia Plan Comments:         Anesthesia Quick Evaluation

## 2012-04-06 NOTE — Transfer of Care (Addendum)
Immediate Anesthesia Transfer of Care Note  Patient: Kathleen Morales  Procedure(s) Performed: Procedure(s) (LRB) with comments: TOTAL KNEE ARTHROPLASTY (Left)  Patient Location: PACU  Anesthesia Type: General  Level of Consciousness: awake, alert  and oriented  Airway & Oxygen Therapy: Patient Spontanous Breathing  Post-op Assessment: Report given to PACU RN  Post vital signs: Reviewed and stable  Complications: No apparent anesthesia complications and BP elevated in PACU .  10 mg labetalol administered.  Dr. Oletta Lamas notifies and at bedside

## 2012-04-06 NOTE — Progress Notes (Signed)
Orthopedic Tech Progress Note Patient Details:  Kathleen Morales [birth year] [id number]  CPM Left Knee CPM Left Knee: On Left Knee Flexion (Degrees): 60  Left Knee Extension (Degrees): 0  Additional Comments: no ohf available at this time; one will be provided when available   Hildred Priest 04/06/2012, 2:31 PM

## 2012-04-06 NOTE — Anesthesia Postprocedure Evaluation (Signed)
  Anesthesia Post-op Note  Patient: Kathleen Morales  Procedure(s) Performed: Procedure(s) (LRB) with comments: TOTAL KNEE ARTHROPLASTY (Left)  Patient Location: PACU  Anesthesia Type: General  Level of Consciousness: awake  Airway and Oxygen Therapy: Patient Spontanous Breathing  Post-op Pain: mild  Post-op Assessment: Post-op Vital signs reviewed  Post-op Vital Signs: Reviewed  Complications: No apparent anesthesia complications

## 2012-04-06 NOTE — Progress Notes (Signed)
Patient ID: Kathleen Morales, female   DOB: Feb 06, 1929, 78 y.o.   MRN: [id number] The recent History & Physical has been reviewed. I have personally examined the patient today. There is no interval change to the documented History & Physical. The patient would like to proceed with the procedure.  [name] W 04/06/2012,  10:16 AM

## 2012-04-06 NOTE — Anesthesia Procedure Notes (Addendum)
Procedure Name: Intubation Date/Time: 04/06/2012 11:05 AM Performed by: Maeola Harman Pre-anesthesia Checklist: Patient identified, Emergency Drugs available, Suction available, Patient being monitored and Timeout performed Patient Re-evaluated:Patient Re-evaluated prior to inductionOxygen Delivery Method: Circle system utilized Preoxygenation: Pre-oxygenation with 100% oxygen Intubation Type: IV induction Ventilation: Mask ventilation without difficulty Laryngoscope Size: Mac and 3 Grade View: Grade I Tube type: Oral Tube size: 7.5 mm Number of attempts: 1 Airway Equipment and Method: Stylet and LTA kit utilized Placement Confirmation: ETT inserted through vocal cords under direct vision,  positive ETCO2 and breath sounds checked- equal and bilateral Secured at: 22 cm Tube secured with: Tape Dental Injury: Teeth and Oropharynx as per pre-operative assessment     Anesthesia Regional Block:  Femoral nerve blockFemoral nerve block Narrative:    Anesthesia Regional Block:  Femoral nerve block  Pre-Anesthetic Checklist: ,, timeout performed, Correct Patient, Correct Site, Correct Laterality, Correct Procedure, Correct Position, site marked, Risks and benefits discussed,  Surgical consent,  Pre-op evaluation,  At surgeon's request and post-op pain management  Laterality: Left  Prep: chloraprep       Needles:   Needle Type: Echogenic Needle          Additional Needles:  Procedures: Doppler guided Femoral nerve block  Nerve Stimulator or Paresthesia:  Response: 0.5 mA,   Additional Responses:   Narrative:  Start time: 04/07/2012 9:30 AM End time: 04/13/2012 9:45 AM Injection made incrementally with aspirations every 5 mL. Anesthesiologist: Dr. Oletta Lamas  Femoral nerve block

## 2012-04-06 NOTE — Op Note (Signed)
PATIENT ID:      MALLOREY ODONELL  MRN:     [id number] DOB/AGE:    [birth year] / 78 y.o.       OPERATIVE REPORT    DATE OF PROCEDURE:  04/06/2012       PREOPERATIVE DIAGNOSIS:   osteoarthritis left knee                                                       Estimated Body mass index is 25.08 kg/(m^2) as calculated from the following:   Height as of 03/17/12: 5' 2" (1.575 m).   Weight as of 03/17/12: 137 lb 1.9 oz(62.197 kg).     POSTOPERATIVE DIAGNOSIS:   osteoarthritis left knee                                                                     Estimated Body mass index is 25.08 kg/(m^2) as calculated from the following:   Height as of 03/17/12: 5' 2" (1.575 m).   Weight as of 03/17/12: 137 lb 1.9 oz(62.197 kg).     PROCEDURE:  Procedure(s): TOTAL KNEE ARTHROPLASTY left     SURGEON:  [name], MD    ASSISTANT:   Biagio Borg, PA-C   (Present and scrubbed throughout the case, critical for assistance with exposure, retraction, instrumentation, and closure.)          ANESTHESIA: regional and general     DRAINS: (left knee) Hemovact drain(s) in the open with  Suction Open :      TOURNIQUET TIME:  Total Tourniquet Time Documented: area (laterality) - 65 minutes    COMPLICATIONS:  None   CONDITION:  stable  PROCEDURE IN DETAIL: [id number]   Znya Albino W 04/06/2012, 12:51 PM

## 2012-04-07 ENCOUNTER — Encounter (HOSPITAL_COMMUNITY): Payer: Self-pay | Admitting: General Practice

## 2012-04-07 LAB — BASIC METABOLIC PANEL
CO2: 27 mEq/L (ref 19–32)
Calcium: 8.4 mg/dL (ref 8.4–10.5)
GFR calc non Af Amer: 84 mL/min — ABNORMAL LOW (ref >90)
Potassium: 3.9 mEq/L (ref 3.5–5.1)
Sodium: 135 mEq/L (ref 135–145)

## 2012-04-07 LAB — CBC
MCH: 33.2 pg (ref 26.0–34.0)
Platelets: 253 10*3/uL (ref 150–400)
RBC: 3.22 MIL/uL — ABNORMAL LOW (ref 3.87–5.11)

## 2012-04-07 MED ORDER — RIVAROXABAN 10 MG PO TABS
10.0000 mg | ORAL_TABLET | Freq: Every day | ORAL | Status: DC
  Administered 2012-04-07: 10 mg via ORAL
  Filled 2012-04-07 (×2): qty 1

## 2012-04-07 MED ORDER — ACETAMINOPHEN 10 MG/ML IV SOLN
1000.0000 mg | Freq: Four times a day (QID) | INTRAVENOUS | Status: DC
  Administered 2012-04-07 – 2012-04-08 (×3): 1000 mg via INTRAVENOUS
  Filled 2012-04-07 (×5): qty 100

## 2012-04-07 NOTE — Progress Notes (Signed)
Patient ID: Kathleen Morales, female   DOB: [birth year], 78 y.o.   MRN: [id number] PATIENT ID: Kathleen Morales        MRN:  [id number]          DOB/AGE: May 20, 1929 / 78 y.o.    [name], MD   Biagio Borg, PA-C [address] [place], [place]  [address]                             [phone]   PROGRESS NOTE 2 Subjective:  negative for Chest Pain  negative for Shortness of Breath  negative for Nausea/Vomiting   negative for Calf Pain    Tolerating Diet: yes         Patient reports pain as mild.     Nausea post op, but none  this am, VS stable,comfortable night  Objective: Vital signs in last 24 hours:   Patient Vitals for the past 24 hrs:  BP Temp Temp src Pulse Resp SpO2  04/07/12 0550 112/56 mmHg 98.6 F (37 C) - 73  18  96 %  04/07/12 0400 - - - - 16  96 %  04/07/12 0153 121/54 mmHg 98.6 F (37 C) - 69  16  96 %  04/06/12 2352 - - - - 16  98 %  04/06/12 2115 133/102 mmHg 97.6 F (36.4 C) - 68  16  98 %  04/06/12 2000 - - - - 16  94 %  04/06/12 1757 160/66 mmHg 97.9 F (36.6 C) - 63  16  94 %  04/06/12 1700 190/94 mmHg 97.4 F (36.3 C) - 62  16  95 %  04/06/12 1530 169/74 mmHg - - 65  11  100 %  04/06/12 1527 - 97.3 F (36.3 C) - 67  14  100 %  04/06/12 1515 143/61 mmHg - - 55  17  99 %  04/06/12 1500 144/63 mmHg - - 54  12  99 %  04/06/12 1445 170/70 mmHg - - 57  10  97 %  04/06/12 1430 186/85 mmHg - - 62  11  95 %  04/06/12 1415 198/88 mmHg - - 66  12  100 %  04/06/12 1400 184/83 mmHg - - 58  19  100 %  04/06/12 1345 185/89 mmHg - - 55  10  100 %  04/06/12 1330 205/90 mmHg - - 81  9  100 %  04/06/12 1328 - 97 F (36.1 C) - - - -  04/06/12 1050 - - - 64  - 100 %  04/06/12 1045 - - - 61  - 98 %  04/06/12 1040 - - - 64  - 100 %  04/06/12 1035 - - - 67  - 100 %  04/06/12 1030 - - - 66  - 100 %  04/06/12 1025 - - - 63  - 100 %  04/06/12 1021 - - - 84  - 96 %  04/06/12 0837 165/81 mmHg 97.9 F (36.6 C) Oral 77  18  96 %      Intake/Output from  previous day:   09/10 0701 - 09/11 0700 In: 3363.8 [P.O.:120; I.V.:2943.8] Out: 3150 [Urine:2700; Drains:400]   Intake/Output this shift:       Intake/Output      09/10 0701 - 09/11 0700 09/11 0701 - 09/12 0700   P.O. 120    I.V. 2943.8    IV Piggyback 300    Total  Intake 3363.8    Urine 2700    Drains 400    Blood 50    Total Output 3150    Net +213.8            LABORATORY DATA:  Basename 04/07/12 0625 04/01/12 1542  WBC 11.9* 12.6*  HGB 10.7* 14.3  HCT 32.1* 42.9  PLT 253 327    Basename 04/01/12 1542  NA 139  K 4.0  CL 102  CO2 29  BUN 15  CREATININE 0.72  GLUCOSE 89  CALCIUM 9.9   Lab Results  Component Value Date   INR 1.03 04/01/2012    Recent Radiographic Studies :   Chest 2 View  04/01/2012  *RADIOLOGY REPORT*  Clinical Data: Preop for knee replacement  CHEST - 2 VIEW  Comparison: 11/15/2008  Findings: The cardiomediastinal silhouette is stable.  Mild thoracic dextroscoliosis.  No acute infiltrate or pleural effusion. No pulmonary edema.  Mild degenerative changes mid thoracic spine.  IMPRESSION:  No active disease.  Mild degenerative changes and mild thoracic spine dextroscoliosis.   Original Report Authenticated By: Lahoma Crocker, M.D.      Examination:  General appearance: alert, cooperative and no distress  Wound Exam: clean, dry, intact   Drainage:  hemovac output 175cc in last shift  Motor Exam: EHL, FHL, Anterior Tibial and Posterior Tibial Intact  Sensory Exam: Superficial Peroneal, Deep Peroneal and Tibial normal  Vascular Exam: Normal  Assessment:    1 Day Post-Op  Procedure(s) (LRB): TOTAL KNEE ARTHROPLASTY (Left)  ADDITIONAL DIAGNOSIS:  Principal Problem:  *Osteoarthritis of knee     Plan: Physical Therapy as ordered Partial Weight Bearing @ 50% (PWB)  DVT Prophylaxis:  Xarelto  DISCHARGE PLAN: Skilled Nursing Facility/Rehab-[facility]  DISCHARGE NEEDS: has equipment  OOB with PT, foley out, tolerating breakfast, D/C  hemovac in am       [name] W 04/07/2012 7:29 AM

## 2012-04-07 NOTE — Progress Notes (Signed)
CARE MANAGEMENT NOTE 04/07/2012  Patient:  Kathleen Morales, Kathleen Morales   Account Number:  [id number]  Date Initiated:  04/07/2012  Documentation initiated by:  Ricki Miller  Subjective/Objective Assessment:   78 yr old female s/p left left total knee arthroplasty     Action/Plan:   CM spoke with patient regarding Home health needs. Patient will go to [facility] for shortterm rehab. Social worker, Kendell Bane, is aware.   Anticipated DC Date:  04/08/2012   Anticipated DC Plan:  SKILLED NURSING FACILITY  In-house referral  Clinical Social Worker         Choice offered to / List presented to:             Status of service:  Completed, signed off Medicare Important Message given?   (If response is "NO", the following Medicare IM given date fields will be blank) Date Medicare IM given:   Date Additional Medicare IM given:    Discharge Disposition:  SKILLED NURSING FACILITY  Per UR Regulation:    If discussed at Long Length of Stay Meetings, dates discussed:    Comments:

## 2012-04-07 NOTE — Evaluation (Addendum)
Physical Therapy Evaluation Patient Details Name: Kathleen Morales MRN: [id number] DOB: [birth year] Today's Date: 04/07/2012 Time: 3818-2993 PT Time Calculation (min): 24 min  PT Assessment / Plan / Recommendation Clinical Impression  Pt is an 78 y/o female s/p L TKA.  Pt has schedule dicharge to SNF for rehab. Acute will follow Pt.Marland Kitchen    PT Assessment  Patient needs continued PT services    Follow Up Recommendations  Skilled Nursing facility     Barriers to Discharge Decreased caregiver support;Inaccessible home environment Pt will have intermittent supervision only.     Equipment Recommendations  None recommended by PT    Recommendations for Other Services     Frequency 7X/week    Precautions / Restrictions Precautions Precautions: Knee Restrictions Weight Bearing Restrictions: Yes LLE Weight Bearing: Partial weight bearing LLE Partial Weight Bearing Percentage or Pounds: 50%   Pertinent Vitals/Pain Pt reporting pain 6/10  RN gave pain meds after session.       Mobility  Bed Mobility Bed Mobility: Supine to Sit;Sitting - Scoot to Edge of Bed Supine to Sit: 4: Min assist;HOB flat Supine to Sit: Patient Percentage: 80% Sitting - Scoot to Edge of Bed: 4: Min assist Details for Bed Mobility Assistance: Assist for L LE  Transfers Transfers: Sit to Stand;Stand to [employer] Transfers Sit to Stand: 4: Min assist;From bed;With upper extremity assist Stand to Sit: 4: Min assist;With armrests;With upper extremity assist;To chair/3-in-1 Stand Pivot Transfers: 4: Min assist Details for Transfer Assistance: Assist to steady pt and controll descent to chair.  Assist to manage walker.  Ambulation/Gait Ambulation/Gait Assistance: 3: Mod assist Ambulation Distance (Feet): 5 Feet Assistive device: Rolling walker Ambulation/Gait Assistance Details: Assist to manage walker assist to advance L LE.  Cueing for technique.  Gait Pattern: Step-to pattern;Decreased step length -  right;Decreased step length - left;Decreased stance time - left Stairs: No Wheelchair Mobility Wheelchair Mobility: No    Exercises Total Joint Exercises Ankle Circles/Pumps: 10 reps;Both;AAROM Quad Sets: 5 reps;Both;Strengthening   PT Diagnosis: Abnormality of gait;Difficulty walking;Generalized weakness;Acute pain  PT Problem List: Decreased strength;Decreased range of motion;Decreased activity tolerance;Decreased balance;Decreased mobility;Decreased knowledge of use of DME;Pain PT Treatment Interventions: Gait training;DME instruction;Functional mobility training;Therapeutic activities;Therapeutic exercise;Patient/family education;Neuromuscular re-education   PT Goals Acute Rehab PT Goals PT Goal Formulation: With patient Time For Goal Achievement: 04/14/12 Potential to Achieve Goals: Good Pt will go Supine/Side to Sit: with supervision PT Goal: Supine/Side to Sit - Progress: Goal set today Pt will go Sit to Supine/Side: with supervision PT Goal: Sit to Supine/Side - Progress: Goal set today Pt will go Sit to Stand: with supervision PT Goal: Sit to Stand - Progress: Goal set today Pt will go Stand to Sit: with supervision PT Goal: Stand to Sit - Progress: Goal set today Pt will Transfer Bed to Chair/Chair to Bed: with supervision PT Transfer Goal: Bed to Chair/Chair to Bed - Progress: Goal set today Pt will Ambulate: 51 - 150 feet;with supervision;with rolling walker PT Goal: Ambulate - Progress: Goal set today Pt will Perform Home Exercise Program: with supervision, verbal cues required/provided PT Goal: Perform Home Exercise Program - Progress: Goal set today  Visit Information  Last PT Received On: 04/07/12 Assistance Needed: +1    Subjective Data  Subjective: Agree to PT Eval Patient Stated Goal: D/c to [facility]   Prior [place] Lives With: Significant other Available Help at Discharge: [place] Type of Home: House Home Access: Stairs  to enter [employer] of  Steps: 3 Entrance Stairs-Rails: None Home Layout: One level Bathroom Shower/Tub: Walk-in shower;Door Prior Function Level of Independence: Independent Able to Take Stairs?: Yes Driving: Yes Vocation: Retired Corporate investment banker: HOH Dominant Hand: Right    Cognition  Overall Cognitive Status: Appears within functional limits for tasks assessed/performed Arousal/Alertness: Awake/alert Orientation Level: Appears intact for tasks assessed Behavior During Session: [facility] for tasks performed    Extremity/Trunk Assessment Right Upper Extremity Assessment RUE ROM/Strength/Tone: Within functional levels Left Upper Extremity Assessment LUE ROM/Strength/Tone: Within functional levels Right Lower Extremity Assessment RLE ROM/Strength/Tone: Within functional levels Left Lower Extremity Assessment LLE ROM/Strength/Tone: Deficits;Due to pain LLE ROM/Strength/Tone Deficits: Limited strength and ROM in L Knee secondaryt to surgery.   Trunk Assessment Trunk Assessment: Normal   Balance    End of Session PT - End of Session Equipment Utilized During Treatment: Gait belt Activity Tolerance: Patient tolerated treatment well Patient left: in chair;with call bell/phone within reach Nurse Communication: Mobility status CPM Left Knee CPM Left Knee: Off  GP     Antolin Belsito 04/07/2012, 10:09 AM  Collier Salina L. [place] DPT [phone]

## 2012-04-07 NOTE — Clinical Social Work Placement (Addendum)
    Clinical Social Work Department CLINICAL SOCIAL WORK PLACEMENT NOTE 04/07/2012  Patient:  Kathleen Morales, Kathleen Morales  Account Number:  [id number] Admit date:  04/06/2012  Clinical Social Worker:  Butch Penny Yehudis Monceaux, BSW  Date/time:  04/07/2012 04:41 PM  Clinical Social Work is seeking post-discharge placement for this patient at the following level of care:   SKILLED NURSING   (*CSW will update this form in Epic as items are completed)     Patient/family provided with [place] Department of Clinical Social Work's list of facilities offering this level of care within the geographic area requested by the patient (or if unable, by the patient's family).    Patient/family informed of their freedom to choose among providers that offer the needed level of care, that participate in Medicare, Medicaid or managed care program needed by the patient, have an available bed and are willing to accept the patient.    Patient/family informed of [facility]' ownership interest in [facility], as well as of the fact that they are under no obligation to receive care at this facility.  PASARR submitted to EDS on 04/07/2012 PASARR number received from EDS on 04/07/2012  FL2 transmitted to all facilities in geographic area requested by pt/family on  04/07/2012 FL2 transmitted to all facilities within larger geographic area on   Patient informed that his/her managed care company has contracts with or will negotiate with  certain facilities, including the following:   [facility].  Patient declined SNF list as she pre-chose [facility]     Patient/family informed of bed offers received:  04/07/2012 Patient chooses bed at [facility] [place] Physician recommends and patient chooses bed at    Patient to be transferred to [facility] on  04/08/12 Patient to be transferred to facility by [facility] (per patient's requests)  The following physician  request were entered in Epic:   Additional Comments: DC today per MD.  Patient is aware and has arranged for transport by car with her significant other.  Ok per SNF and pt's nurse- Sharyn Lull.  Kathleen Morales. [place], [place]

## 2012-04-07 NOTE — Op Note (Signed)
Kathleen Morales, Kathleen Morales NO.:  [id number]  MEDICAL RECORD NO.:  [id number]  LOCATION:  5N19C                        FACILITY:  [facility]  PHYSICIAN:  Vonna Kotyk. [place], M.D.DATE OF BIRTH:  02-22-1929  DATE OF PROCEDURE:  04/06/2012 DATE OF DISCHARGE:                              OPERATIVE REPORT   PREOPERATIVE DIAGNOSIS:  End-stage osteoarthritis, left knee.  POSTOPERATIVE DIAGNOSIS:  End-stage osteoarthritis, left knee.  PROCEDURE:  Left total knee replacement.  SURGEON:  Vonna Kotyk. Durward Fortes, M.D.  ASSISTANT:  Aaron Edelman D. Petrarca, PA-C who was present throughout the operative procedure.  ANESTHESIA:  General with supplemental left femoral nerve block.  COMPLICATIONS:  None.  PROCEDURE:  Kathleen Morales was met in the holding area, identified the left knee as appropriate operative site.  Anesthesia performed a left femoral nerve block.  The patient was then transported to room #7, placed under general anesthesia without difficulty.  Nursing staff inserted the Foley catheter.  Urine was clear.  Tourniquet was applied to the left thigh.  The left leg was then prepped with Betadine scrub and DuraPrep from the tourniquet to the midfoot. Sterile draping was performed.  With the extremity elevated, was Esmarch exsanguinated with a proximal tourniquet at 350 mmHg.  A midline longitudinal incision was made centered about the patella, extending from the superior pouch to the tibial tubercle.  Via sharp dissection, incision was carried down to subcutaneous tissue.  First layer of capsule was incised in midline. A medial parapatellar incision was made with the Bovie.  The joint was entered.  There was a clear yellow joint effusion.  The patella was everted 180 degrees laterally, the knee flexed to 90 degrees.  There was beefy red synovitis consistent with osteoarthritis and her history of rheumatoid arthritis.  I did not see any pannus formation.  There was  near-complete absence of articular cartilage in the lateral femoral condyle and a large area, at least size of a quarter on the medial femoral condyle devoid of articular cartilage.  There were osteophytes along the medial lateral femoral condyle and tibial plateau. Osteophytes removed.  I measured a medium femoral component.  First bony cut was made transversely on the tibia with a 7-degree angle of declination.  Subsequent cuts were then made on the femur using the medium femoral jig.  Flexion extension gaps were symmetrical at 10 mm. MCL and LCL remained intact.  Lamina spreaders were inserted and the medial and lateral compartments were removed and I removed the medial lateral menisci as well as ACL and PCL.  There was a large Baker cyst along the posterior medial aspect of the knee which was debrided.  Osteophytes removed from the posterior femoral condyles with a curved 3/4 inch osteotome.  A 4 degree distal femoral valgus cut was then made.  Finishing cut was made for tapering and to obtain the center holes on the femur.  Retractors were then placed about the tibia.  The tibia was advanced anteriorly, measured a #2 keeled tibial tray.  This was impacted.  The center hole made followed by the keeled cut.  The 10 mm bridging bearing was applied followed by the medium femoral component.  The knee was then  reduced.  We had slight hyperextension opening medially with a valgus stress.  Because the patient has history of rheumatoid arthritis, on prednisone, possibility of stretching the soft tissue, we then trialed a 12.5 mm polyethylene bridging bearing.  We thought that at that point we had full extension without hyperextension and no opening with varus or valgus stress.  We elected to use the 12.5 component.  There was no malrotation.  Patella was prepared by removing 10 mm of bone, leaving 13 mm patellar thickness.  Three holes were then made.  Trial patella was inserted  and through a full range of motion after reduction remained stable.  Trial components removed.  The joint was then copiously irrigated with saline solution.  Final components were then impacted with polymethyl methacrylate.  We initially impacted the tibia followed by the 12.5 mm bridging bearing and then the femur.  The knee was reduced, extraneous methacrylate was removed from the periphery of the components.  Patella was then applied with methacrylate and a patellar clamp.  Methacrylate was removed from its periphery.  At approximately 15 minutes, methacrylate was hard.  The joint was explored with evidence of loose material.  We injected 0.25% Marcaine with epinephrine along the deep capsule while waiting for the methacrylate to mature.  Tourniquet was deflated at 65 minutes.  We had immediate capillary refill and bleeding from the joint surface.  Gross bleeders were Bovie coagulated.  Medium-size Hemovac was inserted.  We had a nice dry field at that point.  Components looked just fine.  The deep capsule was then closed with interrupted #1 Ethibond, superficial capsule closed with running 0 Vicryl, subcu with interrupted 4-0 Monocryl.  Skin was closed with skin clips.  Sterile bulky dressing was applied followed by the patient's support stocking.  The patient tolerated the procedure without complications.     Vonna Kotyk. Durward Fortes, M.D.     PWW/MEDQ  D:  04/06/2012  T:  04/07/2012  Job:  [id number]

## 2012-04-07 NOTE — Progress Notes (Signed)
Physical Therapy Treatment Patient Details Name: Kathleen Morales MRN: [id number] DOB: 1929-04-13 Today's Date: 04/07/2012 Time: 9935-7017 PT Time Calculation (min): 29 min  PT Assessment / Plan / Recommendation Comments on Treatment Session  Pt much improved with mobility and activity tolerance.     Follow Up Recommendations  Supervision/Assistance - 24 hour;Skilled nursing facility    Barriers to Discharge        Equipment Recommendations  Defer to next venue    Recommendations for Other Services    Frequency 7X/week   Plan Discharge plan remains appropriate;Frequency remains appropriate    Precautions / Restrictions Precautions Precautions: Knee Precaution Booklet Issued: No Restrictions Weight Bearing Restrictions: Yes LLE Weight Bearing: Partial weight bearing LLE Partial Weight Bearing Percentage or Pounds: 50   Pertinent Vitals/Pain Pt reports pain in knee 3/10, Premedicated.      Mobility  Bed Mobility Bed Mobility: Sit to Supine Sit to Supine: 5: Supervision;HOB flat Details for Bed Mobility Assistance: Pt able to raise L LE into bed without. assistance Transfers Transfers: Sit to Stand;Stand to Sit Sit to Stand: 4: Min assist;With upper extremity assist;From chair/3-in-1 Stand to Sit: 4: Min assist;With armrests;With upper extremity assist;To chair/3-in-1 Stand Pivot Transfers: Not tested (comment) Details for Transfer Assistance: Cueing for hand and LE positioning. Assis for controlled descent.  Ambulation/Gait Ambulation/Gait Assistance: 4: Min assist Ambulation Distance (Feet): 50 Feet Assistive device: Rolling walker Ambulation/Gait Assistance Details: Cueing for gait sequencing. Assist to manage walker with change in direction.  Cues for PWB (pt reported putting most of her weight on LLE. Gait Pattern: Step-to pattern;Decreased step length - right;Decreased step length - left;Decreased stance time - left Stairs: No Wheelchair Mobility Wheelchair  Mobility: No    Exercises Total Joint Exercises Ankle Circles/Pumps: 10 reps;Both;AAROM Quad Sets: 5 reps;Both;Strengthening Heel Slides: 5 reps;Left;AROM Straight Leg Raises: 5 reps;Left;AAROM Goniometric ROM: 0-76 degrees AAROM with pain at end range   PT Diagnosis:    PT Problem List:   PT Treatment Interventions:     PT Goals Acute Rehab PT Goals PT Goal Formulation: With patient Time For Goal Achievement: 04/14/12 Potential to Achieve Goals: Good Pt will go Supine/Side to Sit: with supervision PT Goal: Supine/Side to Sit - Progress: Progressing toward goal Pt will go Sit to Supine/Side: with supervision PT Goal: Sit to Supine/Side - Progress: Progressing toward goal Pt will go Sit to Stand: with supervision PT Goal: Sit to Stand - Progress: Progressing toward goal Pt will go Stand to Sit: with supervision PT Goal: Stand to Sit - Progress: Progressing toward goal Pt will Transfer Bed to Chair/Chair to Bed: with supervision PT Transfer Goal: Bed to Chair/Chair to Bed - Progress: Progressing toward goal Pt will Ambulate: 51 - 150 feet;with supervision;with rolling walker PT Goal: Ambulate - Progress: Progressing toward goal Pt will Perform Home Exercise Program: with supervision, verbal cues required/provided PT Goal: Perform Home Exercise Program - Progress: Progressing toward goal  Visit Information  Last PT Received On: 04/07/12 Assistance Needed: +1    Subjective Data  Subjective: I was hurting earlier but I feel better now Patient Stated Goal: D/c to [facility]   Cognition  Overall Cognitive Status: Appears within functional limits for tasks assessed/performed Arousal/Alertness: Awake/alert Orientation Level: Appears intact for tasks assessed Behavior During Session: [facility] for tasks performed    Balance     End of Session PT - End of Session Equipment Utilized During Treatment: Gait belt Activity Tolerance: Patient tolerated treatment well Patient left: in bed;in  CPM;with  call bell/phone within reach Nurse Communication: Mobility status   GP     Aasir Daigler 04/07/2012, 4:24 PM Carleton [place] L. Theseus Birnie DPT [phone]

## 2012-04-07 NOTE — Clinical Social Work Psychosocial (Signed)
     Clinical Social Work Department BRIEF PSYCHOSOCIAL ASSESSMENT 04/07/2012  Patient:  Kathleen Morales, Kathleen Morales     Account Number:  [id number]     Admit date:  04/06/2012  Clinical Social Worker:  Caryl Comes  Date/Time:  04/07/2012 04:30 AM  Referred by:  Physician  Date Referred:  04/07/2012 Referred for  SNF Placement   Other Referral:   Patient was a Category 3 - referral sent for SNF during pre-op.   Interview type:  Patient Other interview type:    PSYCHOSOCIAL DATA Living Status:  SIGNIFICANT OTHER Admitted from facility:   Level of care:   Primary support name:  Kathleen Morales Primary support relationship to patient:  PARTNER Degree of support available:   Strong support from Partner and other family including a daughter who is HCPOA    CURRENT CONCERNS Current Concerns  Post-Acute Placement   Other Concerns:    SOCIAL WORK ASSESSMENT / PLAN Met with patient and her significant other today. Patient is pre-signed with [facility]. CSW was notified prior to admission via Category 3 process. Notified [facility]- Ebony Hail- they wil have a bed for patient when medically stable.  Fl2 initiated and placed on chart for MD's signature.  She will return home after rehab.   Assessment/plan status:  Psychosocial Support/Ongoing Assessment of Needs Other assessment/ plan:   Information/referral to community resources:   SNF deferred as she pre-chose [facility]    PATIENTS/FAMILYS RESPONSE TO PLAN OF CARE: Patient is alert, oriented and extremely pleasant. She has arranged for her rehab at [place] and states that her family is in agreement with this plan. She requests transport via car.

## 2012-04-08 LAB — CBC
Hemoglobin: 11.5 g/dL — ABNORMAL LOW (ref 12.0–15.0)
MCV: 101.5 fL — ABNORMAL HIGH (ref 78.0–100.0)
Platelets: 276 10*3/uL (ref 150–400)
RBC: 3.44 MIL/uL — ABNORMAL LOW (ref 3.87–5.11)
WBC: 12.1 10*3/uL — ABNORMAL HIGH (ref 4.0–10.5)

## 2012-04-08 LAB — BASIC METABOLIC PANEL
CO2: 28 mEq/L (ref 19–32)
Chloride: 105 mEq/L (ref 96–112)
Sodium: 141 mEq/L (ref 135–145)

## 2012-04-08 MED ORDER — TRAMADOL HCL 50 MG PO TABS: 50.0000 mg | ORAL_TABLET | Freq: Four times a day (QID) | ORAL | Status: DC | PRN

## 2012-04-08 MED ORDER — METHOCARBAMOL 500 MG PO TABS: 500.0000 mg | ORAL_TABLET | Freq: Three times a day (TID) | ORAL | Status: AC | PRN

## 2012-04-08 MED ORDER — OXYCODONE HCL 5 MG PO TABS: 5.0000 mg | ORAL_TABLET | ORAL | Status: AC | PRN

## 2012-04-08 MED ORDER — DSS 100 MG PO CAPS: 100.0000 mg | ORAL_CAPSULE | Freq: Two times a day (BID) | ORAL | Status: AC

## 2012-04-08 MED ORDER — RIVAROXABAN 10 MG PO TABS: 10.0000 mg | ORAL_TABLET | Freq: Every day | ORAL | Status: DC

## 2012-04-08 NOTE — Progress Notes (Signed)
Physical Therapy Treatment Patient Details Name: BRIDNEY GUADARRAMA MRN: [id number] DOB: [birth year] Today's Date: 04/08/2012 Time: 9628-3662 PT Time Calculation (min): 38 min  PT Assessment / Plan / Recommendation Comments on Treatment Session  Pt continues to improve with mobility. Left pt in CPM 0-60 degrees. Educated pt on use of CPM machine.      Follow Up Recommendations  Skilled nursing facility;Supervision/Assistance - 24 hour    Barriers to Discharge        Equipment Recommendations  Defer to next venue    Recommendations for Other Services    Frequency 7X/week   Plan Discharge plan remains appropriate;Frequency remains appropriate    Precautions / Restrictions Precautions Precautions: Knee Precaution Booklet Issued: No Restrictions Weight Bearing Restrictions: Yes LLE Weight Bearing: Partial weight bearing LLE Partial Weight Bearing Percentage or Pounds: 50%   Pertinent Vitals/Pain Pt reporting pain 2-4/10 in L knee. Pt had pain medicine earlier this morning and did not feel the need for any pain intervention at this time.     Mobility  Bed Mobility Bed Mobility: Sit to Supine Sit to Supine: 5: Supervision;HOB flat Details for Bed Mobility Assistance: Instructed pt to use leg lifter for L LE to minimize pain as pt had difficulty lifting L LE into bed today.  Transfers Transfers: Sit to Stand;Stand to Sit Sit to Stand: 4: Min guard;From chair/3-in-1;With upper extremity assist Stand to Sit: 4: Min guard;To bed;With upper extremity assist Stand Pivot Transfers: Not tested (comment) Details for Transfer Assistance: Cueing for hand and LE positioning. Assis for controlled descent.  Ambulation/Gait Ambulation/Gait Assistance: 4: Min guard Ambulation Distance (Feet): 80 Feet Assistive device: Rolling walker Ambulation/Gait Assistance Details: Cueing to maintain safe distance from walker, Pt able to demonstrate PWB on LLE. Cueing to increase gait speed  Gait Pattern:  Step-to pattern;Decreased step length - right;Decreased step length - left;Decreased stance time - left Gait velocity: Very slow  General Gait Details: Pt required 2 standing rest breaks secondary to UE fatigue.  Stairs: No Wheelchair Mobility Wheelchair Mobility: No    Exercises     PT Diagnosis:    PT Problem List:   PT Treatment Interventions:     PT Goals Acute Rehab PT Goals PT Goal Formulation: With patient Time For Goal Achievement: 04/14/12 Potential to Achieve Goals: Good Pt will go Supine/Side to Sit: with supervision PT Goal: Supine/Side to Sit - Progress: Met Pt will go Sit to Supine/Side: with supervision PT Goal: Sit to Supine/Side - Progress: Met Pt will go Sit to Stand: with supervision PT Goal: Sit to Stand - Progress: Met Pt will go Stand to Sit: with supervision PT Goal: Stand to Sit - Progress: Progressing toward goal Pt will Transfer Bed to Chair/Chair to Bed: with supervision PT Transfer Goal: Bed to Chair/Chair to Bed - Progress: Met Pt will Ambulate: 51 - 150 feet;with supervision;with rolling walker PT Goal: Ambulate - Progress: Progressing toward goal Pt will Perform Home Exercise Program: with supervision, verbal cues required/provided  Visit Information  Last PT Received On: 04/08/12 Assistance Needed: +1    Subjective Data  Subjective: I feel pretty good Patient Stated Goal: D/c to [facility]   Cognition  Overall Cognitive Status: Appears within functional limits for tasks assessed/performed Arousal/Alertness: Awake/alert Orientation Level: Appears intact for tasks assessed Behavior During Session: [facility] for tasks performed    Balance  Balance Balance Assessed: No  End of Session PT - End of Session Equipment Utilized During Treatment: Gait belt Activity Tolerance: Patient tolerated treatment  well Patient left: in bed;in CPM;with call bell/phone within reach Nurse Communication: Mobility status CPM Left Knee CPM Left Knee: On Left Knee  Flexion (Degrees): 60  Left Knee Extension (Degrees): 0    GP     Alea Ryer 04/08/2012, 9:32 AM Collier [place] L. Darryll Raju DPT [phone]

## 2012-04-08 NOTE — Discharge Summary (Signed)
[name], MD   Biagio Borg, PA-C [place], [place], [place]  [address]                             [phone]  PATIENT ID: Kathleen Morales        MRN:  [id number]          DOB/AGE: [birth year] / 78 y.o.    DISCHARGE SUMMARY  ADMISSION DATE:    04/06/2012 DISCHARGE DATE:   04/08/2012   ADMISSION DIAGNOSIS: osteoarthritis left kneet    DISCHARGE DIAGNOSIS:  osteoarthritis left knee    ADDITIONAL DIAGNOSIS:  Past Medical History  Diagnosis Date  . Ulcerative colitis 1970    takes Colazal bid  . Diverticulosis   . Hemorrhoids   . Polymyalgia rheumatica   . History of Dauphin spotted fever     Elevated titer 04/2006  . 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 Hoarseness     pt states always like this;ENT can't find anything wrong  . Joint pain   . Joint swelling   . Bruises easily   . Gastric ulcer     hx of ;many yrs ago  . Hx of colonic polyps   . Nocturia   . Arthritis     takes Prednisone daily for inflammation  . 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 Chief complaint is that of a painful left knee which has been progressively worsening. She has been a patient of Dr. Arlean Hopping and on numerous occasions has had steroid injections as well as visco-supplementation. She has tried multiple anti-inflammatory medicines which has not really been beneficial. She is now to the point where she is having constant debilitating pain in the left knee with difficulty with activities of daily living. She also has some swelling and giving way with the knee. She has tried all the conservative treatment and has failed.   HOSPITAL COURSE:  Kathleen Morales is a 78 y.o. admitted on 04/06/2012 and found to have a diagnosis of osteoarthritis left knee.  After appropriate laboratory studies were obtained  they  were taken to the operating room on 04/06/2012 and underwent Left Procedure(s): TOTAL KNEE ARTHROPLASTY.   They were given perioperative antibiotics:  Anti-infectives     Start     Dose/Rate Route Frequency Ordered Stop   04/06/12 1630   ceFAZolin (ANCEF) IVPB 1 g/50 mL premix        1 g 100 mL/hr over 30 Minutes Intravenous Every 6 hours 04/06/12 1608 04/06/12 2326   04/05/12 1424   ceFAZolin (ANCEF) IVPB 2 g/50 mL premix        2 g 100 mL/hr over 30 Minutes Intravenous 60 min pre-op 04/05/12 1424 04/06/12 1105        .  Tolerated the procedure well.  Placed with a foley intraoperatively.  Given Ofirmev at induction and for 48 hours.    POD #1, allowed out of bed to a chair.  PT for ambulation and exercise program.  Foley D/C'd in morning.  IV saline locked.  O2 discontionued.  POD #2, continued PT and ambulation.  Hemovac pulled.  Pain controlled  Wants to go to [facility].  The remainder of the hospital course was dedicated to ambulation and strengthening.   The patient was discharged on 2  Days Post-Op in  Stable condition.  Blood products given:none  DIAGNOSTIC STUDIES: Recent vital signs: Patient Vitals for the past 24 hrs:  BP Temp Pulse Resp SpO2  04/08/12 0532 143/66 mmHg 98.6 F (37 C) 77  18  95 %  2012/04/17 2100 138/55 mmHg 98.9 F (37.2 C) 76  18  95 %  April 17, 2012 1400 100/50 mmHg 98.1 F (36.7 C) 68  18  97 %       Recent laboratory studies:  Basename 04/08/12 0621 2012-04-17 0625 04/01/12 1542  WBC 12.1* 11.9* 12.6*  HGB 11.5* 10.7* 14.3  HCT 34.9* 32.1* 42.9  PLT 276 253 327    Basename 04/08/12 0621 04/17/12 0625 04/01/12 1542  NA 141 135 139  K 3.4* 3.9 4.0  CL 105 101 102  CO2 28 27 29   BUN 6 7 15   CREATININE 0.55 0.58 0.72  GLUCOSE 165* 122* 89  CALCIUM 8.8 8.4 9.9   Lab Results  Component Value Date   INR 1.03 04/01/2012     Recent Radiographic Studies :   Chest 2 View  04/01/2012  *RADIOLOGY REPORT*  Clinical Data: Preop for knee replacement   CHEST - 2 VIEW  Comparison: 11/15/2008  Findings: The cardiomediastinal silhouette is stable.  Mild thoracic dextroscoliosis.  No acute infiltrate or pleural effusion. No pulmonary edema.  Mild degenerative changes mid thoracic spine.  IMPRESSION:  No active disease.  Mild degenerative changes and mild thoracic spine dextroscoliosis.   Original Report Authenticated By: Lahoma Crocker, M.D.     DISCHARGE INSTRUCTIONS: Discharge Orders    Future Orders Please Complete By Expires   Diet general      Call MD / Call 911      Comments:   If you experience chest pain or shortness of breath, CALL 911 and be transported to the hospital emergency room.  If you develope a fever above 101 F, pus (white drainage) or increased drainage or redness at the wound, or calf pain, call your surgeon's office.   Constipation Prevention      Comments:   Drink plenty of fluids.  Prune juice may be helpful.  You may use a stool softener, such as Colace (over the counter) 100 mg twice a day.  Use MiraLax (over the counter) for constipation as needed.   Increase activity slowly as tolerated      Patient may shower      Comments:   You may shower without a dressing once there is no drainage.  Do not wash over the wound.  If drainage remains, cover wound with plastic wrap and then shower.   Partial weight bearing      Comments:   50 % WEIGHT BEARING AS TAUGHT IN PHYSICAL THERAPY   Driving restrictions      Comments:   No driving for 6 weeks   Lifting restrictions      Comments:   No lifting for 6 weeks   CPM      Comments:   Continuous passive motion machine (CPM):      Use the CPM from 0 to 60 for 6-8 hours per day.      You may increase by 5 per day.  You may break it up into 2 or 3 sessions per day.      Use CPM for 3-4 weeks or until you are told to stop.   TED hose      Comments:   Use stockings (TED hose) for 3 weeks on operative  leg(s).  You may remove them at night for sleeping.   Change dressing       Comments:   Change dressing on Saturday, then change the dressing daily with sterile 4 x 4 inch gauze dressing and apply TED hose.  You may clean the incision with alcohol prior to redressing.   Do not put a pillow under the knee. Place it under the heel.         DISCHARGE MEDICATIONS:     Medication List     As of 04/08/2012 10:46 AM    STOP taking these medications         fish oil-omega-3 fatty acids 1000 MG capsule      TAKE these medications         balsalazide 750 MG capsule   Commonly known as: COLAZAL   Take 1,500 mg by mouth 2 (two) times daily.      Biotin 1000 MCG tablet   Take 1,000 mcg by mouth daily.      [employer] CALCIUM 1500 MG Tabs   Generic drug: Calcium Carbonate   Take 1 tablet by mouth daily.      DSS 100 MG Caps   Take 100 mg by mouth 2 (two) times daily.      estradiol 0.5 MG tablet   Commonly known as: ESTRACE   Take 0.5 mg by mouth daily.      methocarbamol 500 MG tablet   Commonly known as: ROBAXIN   Take 1 tablet (500 mg total) by mouth every 8 (eight) hours as needed.      oxyCODONE 5 MG immediate release tablet   Commonly known as: Oxy IR/ROXICODONE   Take 1-2 tablets (5-10 mg total) by mouth every 4 (four) hours as needed (prn moderate to severe pain).      predniSONE 5 MG tablet   Commonly known as: DELTASONE   Take 5 mg by mouth daily.      rivaroxaban 10 MG Tabs tablet   Commonly known as: XARELTO   Take 1 tablet (10 mg total) by mouth at bedtime.      traMADol 50 MG tablet   Commonly known as: ULTRAM   Take 1 tablet (50 mg total) by mouth every 6 (six) hours as needed (prn mild to moderate pain). Pain      vitamin B-12 1000 MCG tablet   Commonly known as: CYANOCOBALAMIN   Take 1,000 mcg by mouth daily.      Vitamin D 1000 UNITS capsule   Take 2,000 Units by mouth daily.        FOLLOW UP VISIT:       Follow-up Information    Follow up with [facility] On 04/21/2012.   Contact information:   [address] [place], [place]         DISPOSITION:  Skilled nursing facility  CONDITION:  {Stable  PETRARCA,BRIAN 04/08/2012, 10:46 AM

## 2012-04-08 NOTE — Progress Notes (Signed)
Patient ID: Kathleen Morales, female   DOB: [birth year], 78 y.o.   MRN: [id number] PATIENT ID: Kathleen Morales        MRN:  [id number]          DOB/AGE: 01-05-1929 / 78 y.o.    [name], MD   Biagio Borg, PA-C [address] [place], [place]  [address]                             [phone]   PROGRESS NOTE  Subjective:  negative for Chest Pain  negative for Shortness of Breath  negative for Nausea/Vomiting   negative for Calf Pain    Tolerating Diet: yes         Patient reports pain as mild.     Feeling "good" and ready for [facility]-great effort in PT  Objective: Vital signs in last 24 hours:   Patient Vitals for the past 24 hrs:  BP Temp Pulse Resp SpO2  04/08/12 0532 143/66 mmHg 98.6 F (37 C) 77  18  95 %  04/07/12 2100 138/55 mmHg 98.9 F (37.2 C) 76  18  95 %  04/07/12 1400 100/50 mmHg 98.1 F (36.7 C) 68  18  97 %      Intake/Output from previous day:   09/11 0701 - 09/12 0700 In: 1180 [P.O.:1080] Out: 520 [Urine:400; Drains:120]   Intake/Output this shift:       Intake/Output      09/11 0701 - 09/12 0700 09/12 0701 - 09/13 0700   P.O. 1080    I.V.     IV Piggyback 100    Total Intake 1180    Urine 400    Drains 120    Blood     Total Output 520    Net +660         Urine Occurrence 3 x       LABORATORY DATA:  Basename 04/08/12 0621 04/07/12 0625 04/01/12 1542  WBC 12.1* 11.9* 12.6*  HGB 11.5* 10.7* 14.3  HCT 34.9* 32.1* 42.9  PLT 276 253 327    Basename 04/08/12 0621 04/07/12 0625 04/01/12 1542  NA 141 135 139  K 3.4* 3.9 4.0  CL 105 101 102  CO2 28 27 29   BUN 6 7 15   CREATININE 0.55 0.58 0.72  GLUCOSE 165* 122* 89  CALCIUM 8.8 8.4 9.9   Lab Results  Component Value Date   INR 1.03 04/01/2012    Recent Radiographic Studies :   Chest 2 View  04/01/2012  *RADIOLOGY REPORT*  Clinical Data: Preop for knee replacement  CHEST - 2 VIEW  Comparison: 11/15/2008  Findings: The cardiomediastinal silhouette is stable.  Mild thoracic  dextroscoliosis.  No acute infiltrate or pleural effusion. No pulmonary edema.  Mild degenerative changes mid thoracic spine.  IMPRESSION:  No active disease.  Mild degenerative changes and mild thoracic spine dextroscoliosis.   Original Report Authenticated By: Lahoma Crocker, M.D.      Examination:  General appearance: alert, cooperative and no distress  Wound Exam: clean, dry, intact   Drainage:  None: wound tissue dry- minimal drainage from the hemovac  Motor Exam: EHL, FHL, Anterior Tibial and Posterior Tibial Intact  Sensory Exam: Superficial Peroneal, Deep Peroneal and Tibial normal  Vascular Exam: Normal  Assessment:    2 Days Post-Op  Procedure(s) (LRB): TOTAL KNEE ARTHROPLASTY (Left)  ADDITIONAL DIAGNOSIS:  Principal Problem:  *Osteoarthritis of knee  Plan: Physical Therapy as ordered Partial Weight Bearing @ 50% (PWB)  DVT Prophylaxis:  Xarelto  DISCHARGE PLAN: Skilled Nursing Facility/Rehab-[facility] DISCHARGE NEEDS: has equipment   D/C hemovac,D/C to [facility]-doing well      [name] 04/08/2012 10:34 AM

## 2012-06-10 ENCOUNTER — Telehealth: Payer: Self-pay | Admitting: Gastroenterology

## 2012-06-10 NOTE — Telephone Encounter (Signed)
3 week history of urgent bloody diarrhea.  She has a history of UC and is on colazal.  She will come in and see Tye Savoy RNP tomorrow at 10:30

## 2012-06-11 ENCOUNTER — Encounter: Payer: Self-pay | Admitting: Nurse Practitioner

## 2012-06-11 ENCOUNTER — Ambulatory Visit (INDEPENDENT_AMBULATORY_CARE_PROVIDER_SITE_OTHER): Payer: Medicare Other | Admitting: Nurse Practitioner

## 2012-06-11 VITALS — BP 138/70 | HR 68 | Ht 61.0 in | Wt 135.0 lb

## 2012-06-11 DIAGNOSIS — K5150 Left sided colitis without complications: Secondary | ICD-10-CM

## 2012-06-11 MED ORDER — PREDNISONE 10 MG PO TABS: ORAL_TABLET | ORAL | Status: DC

## 2012-06-11 NOTE — Progress Notes (Signed)
Reviewed and agree with management plans.  Pricilla Riffle. Fuller Plan MD Marval Regal

## 2012-06-11 NOTE — Progress Notes (Signed)
06/11/2012 Kathleen Morales [id number] [birth year]   History of Present Illness:  Patient is an 78 year old female known to Dr. Fuller Plan for history of tubulovillous adenomatous colon polyps and longstanding ulcerative colitis. Her last colonoscopy was February 2012 with findings of colitis in the rectum and sigmoid colon with erythema and granularity. Biopsy c/w mildly active chronic colitis. Patient had been maintained on Colazal 2 TID but she recently increased dose to to 3 pills TID for flare type symptoms.  Unfortunately she has not improved. This is her first flare in 2 years. She is having bloody, mucoid stools. At least 10 times a day she is passing blood and mucus but not necessarily stool She is having frequent incontinence. Her weight is down 10 pounds. No urinary or vaginal symptoms. No nausea. No fevers. She had a knee replacement in September.    Current Medications, Allergies, Past Medical History, Past Surgical History, Family History and Social History were reviewed in [employer] record.   Physical Exam: General: Well developed , white female in no acute distress Head: Normocephalic and atraumatic Eyes:  sclerae anicteric, conjunctiva pink  Ears: Normal auditory acuity Lungs: Clear throughout to auscultation Heart: Regular rate and rhythm Abdomen: Soft, non tender and non distended. No masses, no hepatomegaly. Normal bowel sounds Musculoskeletal: Symmetrical with no gross deformities  Extremities: No edema  Neurological: Alert oriented x 4, grossly nonfocal Psychological:  Alert and cooperative. Normal mood and affect  Assessment and Recommendations:  Long-standing ulcerative colitis. Now with two-month history of LLQ discomfort, bloody, mucoid stools.  She increased her Colazal to 3 tablets TID without any improvement. No recent antibiotics but still need to rule out superimposed infection. Will check stool culture and stool for C. Difficile. I do doubt  she can retain enemas for topical treatment. I think she willrequire a course of steroids. Patient has prednisone at home. Will start her at 40 mg daily. See patient instructions for tapering schedule. She will return to see her primary gastroenterologist, Dr. Fuller Plan, in 2-3 weeks but will call in the interim if symptoms don't improve. I advised low fiber diet until feeling better. Continue current dose of Colazal (3 pills TID )

## 2012-06-11 NOTE — Patient Instructions (Addendum)
Please go to the basement level for the stool studies.   We sent a prescription for Prednisone 10 mg to [facility] in [place]. Prednisone 10 mg Take 4 tab x 7 days Take 3 tab x 7 days Take 2 tab daily until you see Dr. Fuller Plan on 06-30-2012 at 9:45 AM.

## 2012-06-14 ENCOUNTER — Other Ambulatory Visit: Payer: Medicare Other

## 2012-06-14 DIAGNOSIS — K5150 Left sided colitis without complications: Secondary | ICD-10-CM

## 2012-06-17 NOTE — Progress Notes (Signed)
[name], is she improving at all? Any abdominal pain, fevers? Is diarrhea slowing at all?  If not improving on steroids then she needs to be seen again. I don't know if [facility] hasn't any openings in next few days. If not then please see if Amy can see her. Thanks

## 2012-06-17 NOTE — Progress Notes (Signed)
Quick Note:  Plan already discussed with Kathleen Payor, RN. Patient to see Dr. Fuller Plan   Scheduled patient with Dr. Fuller Plan on 06/21/12 at 2:45PM    ______

## 2012-06-18 LAB — STOOL CULTURE

## 2012-06-21 ENCOUNTER — Ambulatory Visit (INDEPENDENT_AMBULATORY_CARE_PROVIDER_SITE_OTHER): Payer: Medicare Other | Admitting: Gastroenterology

## 2012-06-21 ENCOUNTER — Encounter: Payer: Self-pay | Admitting: Gastroenterology

## 2012-06-21 VITALS — BP 120/80 | HR 60 | Ht 61.25 in | Wt 132.4 lb

## 2012-06-21 DIAGNOSIS — K5150 Left sided colitis without complications: Secondary | ICD-10-CM

## 2012-06-21 MED ORDER — MESALAMINE 4 G RE ENEM: 4.0000 g | ENEMA | Freq: Every day | RECTAL | Status: DC

## 2012-06-21 NOTE — Patient Instructions (Addendum)
We have sent the following medications to your pharmacy for you to pick up at your convenience:Rowasa enemas.  You have been given a Low residue diet.

## 2012-06-21 NOTE — Progress Notes (Signed)
History of Present Illness: This is an 77 year old female with a history of ulcerative colitis who has ongoing urgent bloody, mucousy diarrhea occurring between 8-9 times each day. Her symptoms also generally occur within 20-30 minutes after meals.  She has mild lower abdominal pain. Stool cultures for enteric pathogens and C. difficile PCR were negative. Prednisone was added to an increase dose of balsalazide after she was seen by Tye Savoy NP 10 days ago. She has noted only a modest improvement in symptoms.   Current Medications, Allergies, Past Medical History, Past Surgical History, Family History and Social History were reviewed in [employer] record.  Physical Exam: General: Well developed , well nourished, no acute distress Head: Normocephalic and atraumatic Eyes:  sclerae anicteric, EOMI Ears: Normal auditory acuity Mouth: No deformity or lesions Lungs: Clear throughout to auscultation Heart: Regular rate and rhythm; no murmurs, rubs or bruits Abdomen: Soft, non tender and non distended. No masses, hepatosplenomegaly or hernias noted. Normal Bowel sounds Musculoskeletal: Symmetrical with no gross deformities  Pulses:  Normal pulses noted Extremities: No clubbing, cyanosis, edema or deformities noted Neurological: Alert oriented x 4, grossly nonfocal Psychological:  Alert and cooperative. Normal mood and affect  Assessment and Recommendations:  1. Left-sided ulcerative colitis. She has not made significant clinical improvement. Continue current medications and add Rowasa enemas at bedtime. Low residue, lactose free, low caffeine diet. If her symptoms do not adequately respond will proceed with colonoscopy. She is due for surveillance colonoscopy in February. Return office visit in 3-4 weeks.

## 2012-06-30 ENCOUNTER — Ambulatory Visit: Payer: Medicare Other | Admitting: Gastroenterology

## 2012-07-08 ENCOUNTER — Encounter: Payer: Self-pay | Admitting: Gastroenterology

## 2012-07-08 ENCOUNTER — Ambulatory Visit (INDEPENDENT_AMBULATORY_CARE_PROVIDER_SITE_OTHER): Payer: Medicare Other | Admitting: Gastroenterology

## 2012-07-08 VITALS — BP 120/64 | HR 84 | Ht 61.0 in | Wt 135.0 lb

## 2012-07-08 DIAGNOSIS — K5150 Left sided colitis without complications: Secondary | ICD-10-CM

## 2012-07-08 NOTE — Patient Instructions (Addendum)
You will use your Rowasa enema every other night for 1 week and then every 3rd night for 1 week and then stop using. You will take 10 mg (1 pill) daily of your prednisone until gone and then stop. Continue all your other medications as prescribed. CC: [name] MD

## 2012-07-08 NOTE — Progress Notes (Signed)
History of Present Illness: This is an 78 year old female with a history of ulcerative colitis who had ongoing urgent bloody, mucousy diarrhea occurring between 8-9 times each day. Her symptoms also generally occurred within 20-30 minutes after meals. She had mild lower abdominal pain. Stool cultures for enteric pathogens and C. difficile PCR were negative. Rowasa enemas were added to Prednisone and an increased dose of balsalazide. She has noted almost a complete resolution of her symptoms over the past 2 weeks.   Current Medications, Allergies, Past Medical History, Past Surgical History, Family History and Social History were reviewed in [employer] record.   Physical Exam:  General: Well developed , well nourished, no acute distress  Head: Normocephalic and atraumatic  Eyes: sclerae anicteric, EOMI  Ears: Normal auditory acuity  Mouth: No deformity or lesions  Lungs: Clear throughout to auscultation  Heart: Regular rate and rhythm; no murmurs, rubs or bruits  Abdomen: Soft, non tender and non distended. No masses, hepatosplenomegaly or hernias noted. Normal Bowel sounds  Musculoskeletal: Symmetrical with no gross deformities  Pulses: Normal pulses noted  Extremities: No clubbing, cyanosis, edema or deformities noted  Neurological: Alert oriented x 4, grossly nonfocal  Psychological: Alert and cooperative. Normal mood and affect   Assessment and Recommendations:   1. Left-sided ulcerative colitis-flare resolved. Taper Rowasa enemas at bedtime and Prednisone to discontinue both over 2 weeks. Low residue, lactose free, low caffeine diet. Call if symptoms return. Continue Colazal 750 mg po tid long term. She is due for surveillance colonoscopy in February.

## 2012-09-07 ENCOUNTER — Encounter: Payer: Self-pay | Admitting: Gastroenterology

## 2012-12-15 ENCOUNTER — Encounter: Payer: Self-pay | Admitting: Gastroenterology

## 2012-12-15 ENCOUNTER — Ambulatory Visit (INDEPENDENT_AMBULATORY_CARE_PROVIDER_SITE_OTHER): Payer: Medicare Other | Admitting: Gastroenterology

## 2012-12-15 VITALS — BP 118/70 | HR 72 | Ht 61.0 in | Wt 137.0 lb

## 2012-12-15 DIAGNOSIS — Z86010 Personal history of colonic polyps: Secondary | ICD-10-CM

## 2012-12-15 DIAGNOSIS — K5130 Ulcerative (chronic) rectosigmoiditis without complications: Secondary | ICD-10-CM

## 2012-12-15 MED ORDER — BALSALAZIDE DISODIUM 750 MG PO CAPS: 2250.0000 mg | ORAL_CAPSULE | Freq: Three times a day (TID) | ORAL | Status: DC

## 2012-12-15 NOTE — Progress Notes (Signed)
History of Present Illness: This is an 79 year old female with a history of left-sided ulcerative colitis and a prior history of tubulovillous adenomatous colon polyp in 1998. Her last colonoscopy was unremarkable. She is currently asymptomatic. She would like to discontinue routine surveillance examinations.   Current Medications, Allergies, Past Medical History, Past Surgical History, Family History and Social History were reviewed in [employer] record.  Physical Exam: General: Well developed , well nourished, no acute distress Head: Normocephalic and atraumatic Eyes:  sclerae anicteric, EOMI Ears: Normal auditory acuity Mouth: No deformity or lesions Lungs: Clear throughout to auscultation Heart: Regular rate and rhythm; no murmurs, rubs or bruits Abdomen: Soft, non tender and non distended. No masses, hepatosplenomegaly or hernias noted. Normal Bowel sounds Musculoskeletal: Symmetrical with no gross deformities  Pulses:  Normal pulses noted Extremities: No clubbing, cyanosis, edema or deformities noted Neurological: Alert oriented x 4, grossly nonfocal Psychological:  Alert and cooperative. Normal mood and affect  Assessment and Recommendations:  1. Left-sided ulcerative colitis. Will attempt to obtain recent blood work from Dr. Arlean Hopping office. Refill balsalazide. Discontinue routine surveillance colonoscopies.  2. Personal history of TVA. Discontinue routine surveillance colonoscopies.

## 2012-12-15 NOTE — Patient Instructions (Signed)
We have sent the following medications to your pharmacy for you to pick up at your convenience: Colazal.  We have cancelled your recall Colonoscopy.   Thank you for choosing me and [facility].  Pricilla Riffle. Dagoberto Ligas., MD., Marval Regal

## 2013-03-24 ENCOUNTER — Encounter (HOSPITAL_BASED_OUTPATIENT_CLINIC_OR_DEPARTMENT_OTHER): Payer: Self-pay | Admitting: *Deleted

## 2013-03-24 ENCOUNTER — Emergency Department (HOSPITAL_BASED_OUTPATIENT_CLINIC_OR_DEPARTMENT_OTHER): Payer: Medicare Other

## 2013-03-24 ENCOUNTER — Observation Stay (HOSPITAL_BASED_OUTPATIENT_CLINIC_OR_DEPARTMENT_OTHER)
Admission: EM | Admit: 2013-03-24 | Discharge: 2013-03-25 | Disposition: A | Discharge status: Home / Self Care | Payer: Medicare Other | Attending: Internal Medicine | Admitting: Internal Medicine

## 2013-03-24 DIAGNOSIS — Z96659 Presence of unspecified artificial knee joint: Secondary | ICD-10-CM | POA: Insufficient documentation

## 2013-03-24 DIAGNOSIS — Z860100 Personal history of colon polyps, unspecified: Secondary | ICD-10-CM

## 2013-03-24 DIAGNOSIS — Z79899 Other long term (current) drug therapy: Secondary | ICD-10-CM | POA: Insufficient documentation

## 2013-03-24 DIAGNOSIS — R0600 Dyspnea, unspecified: Secondary | ICD-10-CM

## 2013-03-24 DIAGNOSIS — K5150 Left sided colitis without complications: Secondary | ICD-10-CM

## 2013-03-24 DIAGNOSIS — I739 Peripheral vascular disease, unspecified: Secondary | ICD-10-CM

## 2013-03-24 DIAGNOSIS — K5190 Ulcerative colitis, unspecified, without complications: Secondary | ICD-10-CM | POA: Insufficient documentation

## 2013-03-24 DIAGNOSIS — I8390 Asymptomatic varicose veins of unspecified lower extremity: Secondary | ICD-10-CM

## 2013-03-24 DIAGNOSIS — M1710 Unilateral primary osteoarthritis, unspecified knee: Secondary | ICD-10-CM

## 2013-03-24 DIAGNOSIS — R51 Headache: Secondary | ICD-10-CM | POA: Insufficient documentation

## 2013-03-24 DIAGNOSIS — M179 Osteoarthritis of knee, unspecified: Secondary | ICD-10-CM

## 2013-03-24 DIAGNOSIS — R42 Dizziness and giddiness: Secondary | ICD-10-CM

## 2013-03-24 DIAGNOSIS — R030 Elevated blood-pressure reading, without diagnosis of hypertension: Secondary | ICD-10-CM

## 2013-03-24 DIAGNOSIS — M79609 Pain in unspecified limb: Secondary | ICD-10-CM | POA: Insufficient documentation

## 2013-03-24 DIAGNOSIS — IMO0002 Reserved for concepts with insufficient information to code with codable children: Secondary | ICD-10-CM | POA: Insufficient documentation

## 2013-03-24 DIAGNOSIS — R490 Dysphonia: Secondary | ICD-10-CM | POA: Insufficient documentation

## 2013-03-24 DIAGNOSIS — K649 Unspecified hemorrhoids: Secondary | ICD-10-CM | POA: Insufficient documentation

## 2013-03-24 DIAGNOSIS — K5730 Diverticulosis of large intestine without perforation or abscess without bleeding: Secondary | ICD-10-CM | POA: Insufficient documentation

## 2013-03-24 DIAGNOSIS — R55 Syncope and collapse: Principal | ICD-10-CM

## 2013-03-24 DIAGNOSIS — IMO0001 Reserved for inherently not codable concepts without codable children: Secondary | ICD-10-CM | POA: Diagnosis present

## 2013-03-24 DIAGNOSIS — Z8711 Personal history of peptic ulcer disease: Secondary | ICD-10-CM | POA: Insufficient documentation

## 2013-03-24 DIAGNOSIS — M353 Polymyalgia rheumatica: Secondary | ICD-10-CM

## 2013-03-24 DIAGNOSIS — Z01818 Encounter for other preprocedural examination: Secondary | ICD-10-CM

## 2013-03-24 DIAGNOSIS — M129 Arthropathy, unspecified: Secondary | ICD-10-CM | POA: Insufficient documentation

## 2013-03-24 HISTORY — DX: Dorsopathy, unspecified: M53.9

## 2013-03-24 LAB — BASIC METABOLIC PANEL
BUN: 13 mg/dL (ref 6–23)
CO2: 30 mEq/L (ref 19–32)
Chloride: 104 mEq/L (ref 96–112)
Creatinine, Ser: 0.7 mg/dL (ref 0.50–1.10)
Glucose, Bld: 125 mg/dL — ABNORMAL HIGH (ref 70–99)

## 2013-03-24 LAB — CBC WITH DIFFERENTIAL/PLATELET
Basophils Absolute: 0 10*3/uL (ref 0.0–0.1)
HCT: 42.4 % (ref 36.0–46.0)
Hemoglobin: 14.1 g/dL (ref 12.0–15.0)
Lymphocytes Relative: 24 % (ref 12–46)
Lymphs Abs: 2.1 10*3/uL (ref 0.7–4.0)
MCV: 101.4 fL — ABNORMAL HIGH (ref 78.0–100.0)
Monocytes Absolute: 0.7 10*3/uL (ref 0.1–1.0)
Monocytes Relative: 8 % (ref 3–12)
Neutro Abs: 6.1 10*3/uL (ref 1.7–7.7)
RBC: 4.18 MIL/uL (ref 3.87–5.11)
WBC: 9 10*3/uL (ref 4.0–10.5)

## 2013-03-24 LAB — URINALYSIS, ROUTINE W REFLEX MICROSCOPIC
Glucose, UA: NEGATIVE mg/dL
Hgb urine dipstick: NEGATIVE
Leukocytes, UA: NEGATIVE
pH: 6.5 (ref 5.0–8.0)

## 2013-03-24 LAB — TROPONIN I: Troponin I: <0.3 ng/mL (ref <0.30)

## 2013-03-24 MED ORDER — ACETAMINOPHEN 650 MG RE SUPP: 650.0000 mg | Freq: Four times a day (QID) | RECTAL | Status: DC | PRN

## 2013-03-24 MED ORDER — SODIUM CHLORIDE 0.9 % IV BOLUS (SEPSIS)
1000.0000 mL | Freq: Once | INTRAVENOUS | Status: AC
  Administered 2013-03-24: 1000 mL via INTRAVENOUS

## 2013-03-24 MED ORDER — ONDANSETRON HCL 4 MG/2ML IJ SOLN: 4.0000 mg | Freq: Three times a day (TID) | INTRAMUSCULAR | Status: DC | PRN

## 2013-03-24 MED ORDER — DICLOFENAC SODIUM 1 % TD GEL
2.0000 g | Freq: Two times a day (BID) | TRANSDERMAL | Status: DC
  Administered 2013-03-24 – 2013-03-25 (×2): 2 g via TOPICAL
  Filled 2013-03-24: qty 100

## 2013-03-24 MED ORDER — ACETAMINOPHEN 325 MG PO TABS: 650.0000 mg | ORAL_TABLET | Freq: Four times a day (QID) | ORAL | Status: DC | PRN

## 2013-03-24 MED ORDER — SODIUM CHLORIDE 0.9 % IJ SOLN
3.0000 mL | Freq: Two times a day (BID) | INTRAMUSCULAR | Status: DC
  Administered 2013-03-24 – 2013-03-25 (×2): 3 mL via INTRAVENOUS

## 2013-03-24 MED ORDER — HYDRALAZINE HCL 20 MG/ML IJ SOLN: 10.0000 mg | INTRAMUSCULAR | Status: DC | PRN

## 2013-03-24 MED ORDER — ONDANSETRON HCL 4 MG/2ML IJ SOLN
4.0000 mg | Freq: Four times a day (QID) | INTRAMUSCULAR | Status: DC | PRN
Start: 2013-03-24 — End: 2013-03-25

## 2013-03-24 MED ORDER — SODIUM CHLORIDE 0.9 % IV SOLN: INTRAVENOUS | Status: DC

## 2013-03-24 MED ORDER — BALSALAZIDE DISODIUM 750 MG PO CAPS
2250.0000 mg | ORAL_CAPSULE | Freq: Three times a day (TID) | ORAL | Status: DC
  Filled 2013-03-24 (×4): qty 3

## 2013-03-24 MED ORDER — PREDNISONE 5 MG PO TABS
5.0000 mg | ORAL_TABLET | Freq: Every day | ORAL | Status: DC
  Administered 2013-03-25: 5 mg via ORAL
  Filled 2013-03-24 (×2): qty 1

## 2013-03-24 MED ORDER — ONDANSETRON HCL 4 MG PO TABS: 4.0000 mg | ORAL_TABLET | Freq: Four times a day (QID) | ORAL | Status: DC | PRN

## 2013-03-24 NOTE — ED Notes (Signed)
Report given to charge nurse [place]

## 2013-03-24 NOTE — H&P (Signed)
[facility] History and Physical  [name] VFI:[id number] DOB: [birth year] DOA: 03/24/2013  Referring physician: ER physician. PCP: Fae Pippin   Chief Complaint: Loss of consciousness.  HPI: Kathleen Morales is a 79 y.o. female with history of ulcerative colitis in polymyalgia rheumatica was brought to the ER after patient had a spell of loss of consciousness. Patient was at her house on the porch when patient suddenly felt dizzy and fell. Patient stated that she lost consciousness before she fell. She may have lost consciousness for a few seconds. She fell over the posterior and hit her head. Patient did not have any incontinence of urine or tongue bite. Patient was brought to the ER at the [facility]. CT head and x-rays does not show anything acute. EKG shows normal sinus rhythm patient has been admitted for further management. Patient states that her dizziness only was just for a few seconds before she fell. Presently she is asymptomatic. Denies any chest pain shortness of breath. Patient states that off and on she gets palpitations for last many months.  Review of Systems: As presented in the history of presenting illness, rest negative.  Past Medical History  Diagnosis Date  . Ulcerative colitis 1970    takes Colazal bid  . Diverticulosis   . Hemorrhoids   . Polymyalgia rheumatica   . History of [place] spotted fever     Elevated titer 04/2006  . 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 Hoarseness     pt states always like this;ENT can't find anything wrong  . Joint pain   . Joint swelling   . Bruises easily   . Gastric ulcer     hx of ;many yrs ago  . Hx of colonic polyps   . Nocturia   . Arthritis     takes Prednisone daily for inflammation  . Fibromyalgia   . Spine disorder     has had injections and wears a brace   Past Surgical History  Procedure Laterality Date  . Tubal ligation    . Cystectomy      Breast x 2-Right   . Partial hysterectomy      at age 10  . Epidural injections      x 2   . Carpal tunnel release      bilateral  . Colonoscopy    . Cataract extraction, bilateral    . Total knee arthroplasty  04/06/2012    Procedure: TOTAL KNEE ARTHROPLASTY;  Surgeon: [name], MD;  Location: [facility];  Service: Orthopedics;  Laterality: Left;   Social History:  reports that she has never smoked. She has never used smokeless tobacco. She reports that she does not drink alcohol or use illicit drugs. Home. where does patient live-- Can do ADLs. Can patient participate in ADLs?  No Known Allergies  Family History  Problem Relation Age of Onset  . Heart disease Father   . Heart disease Mother   . Breast cancer Sister   . Breast cancer Daughter   . Breast cancer Other     neice  . Colon polyps Other     nephew  . Colon cancer Neg Hx       Prior to Admission medications   Medication Sig Start Date End Date Taking? Authorizing Provider  balsalazide (COLAZAL) 750 MG capsule Take 3 capsules (2,250 mg total) by mouth 3 (three) times daily. 12/15/12  Yes Ladene Artist, MD  BIOTIN PO Take 1 tablet  by mouth daily.   Yes Historical Provider, MD  CALCIUM PO Take 1 tablet by mouth daily.   Yes Historical Provider, MD  Cholecalciferol (VITAMIN D PO) Take 1 tablet by mouth daily.   Yes Historical Provider, MD  Cyanocobalamin (VITAMIN B 12 PO) Take 1 tablet by mouth daily.   Yes Historical Provider, MD  diclofenac sodium (VOLTAREN) 1 % GEL Apply topically 2 (two) times daily.   Yes Historical Provider, MD  estradiol (ESTRACE) 0.5 MG tablet Take 0.5 mg by mouth daily.     Yes Historical Provider, MD  predniSONE (DELTASONE) 10 MG tablet Take 5 mg by mouth daily.  06/11/12  Yes Willia Craze, NP   Physical Exam: Filed Vitals:   03/24/13 1718 03/24/13 1719 03/24/13 2020 03/24/13 2022  BP: 158/78 144/64 178/74 187/73  Pulse: 68  63   Temp:   98.7 F (37.1 C)   TempSrc:   Oral   Resp: 20  18    Height:      Weight:   62.188 kg (137 lb 1.6 oz)   SpO2: 100%  99% 99%     General:  Well-developed and nourished.  Eyes: Anicteric no pallor.  ENT: No discharge from ears eyes nose mouth.  Neck: No mass felt.  Cardiovascular: S1-S2 heard.  Respiratory: No rhonchi or crepitations.  Abdomen: Soft nontender bowel sounds present. No guarding no rigidity.  Skin: No rash.  Musculoskeletal: No edema. Mild pain on moving the right shoulder.  Psychiatric: Appears normal.  Neurologic: Alert awake oriented to time place and person. Moves all extremities.  Labs on Admission:  Basic Metabolic Panel:  Recent Labs Lab 03/24/13 1450  NA 141  K 4.2  CL 104  CO2 30  GLUCOSE 125*  BUN 13  CREATININE 0.70  CALCIUM 10.0   Liver Function Tests: No results found for this basename: AST, ALT, ALKPHOS, BILITOT, PROT, ALBUMIN,  in the last 168 hours No results found for this basename: LIPASE, AMYLASE,  in the last 168 hours No results found for this basename: AMMONIA,  in the last 168 hours CBC:  Recent Labs Lab 03/24/13 1450  WBC 9.0  NEUTROABS 6.1  HGB 14.1  HCT 42.4  MCV 101.4*  PLT 292   Cardiac Enzymes:  Recent Labs Lab 03/24/13 1450  TROPONINI <0.30    BNP (last 3 results) No results found for this basename: PROBNP,  in the last 8760 hours CBG: No results found for this basename: GLUCAP,  in the last 168 hours  Radiological Exams on Admission: Dg Chest 2 View  03/24/2013   *RADIOLOGY REPORT*  Clinical Data: Recent injury with pain  CHEST - 2 VIEW  Comparison: 04/01/2012  Findings: The cardiac shadow is stable.  The lungs are well-aerated bilaterally without new infiltrate or pneumothorax.  No acute bony abnormality noted.  IMPRESSION: No acute abnormality noted.   Original Report Authenticated By: Inez Catalina, M.D.   Dg Shoulder Right  03/24/2013   *RADIOLOGY REPORT*  Clinical Data: Right shoulder pain  RIGHT SHOULDER - 2+ VIEW  Comparison: None.  Findings:  No acute fracture or dislocation is noted.  Mild degenerate changes of the acromioclavicular joint are seen.  The humeral head is high-riding likely related to a chronic rotator cuff injury.  IMPRESSION: Chronic changes without acute abnormality.   Original Report Authenticated By: Inez Catalina, M.D.   Ct Head Wo Contrast  03/24/2013   *RADIOLOGY REPORT*  Clinical Data: Syncopal episode  CT HEAD WITHOUT CONTRAST  Technique:  Contiguous axial images were obtained from the base of the skull through the vertex without contrast.  Comparison: None  Findings: The bony calvarium is intact.  No gross soft tissue abnormality is noted.  A calcified meningioma is noted within the right.  Mild atrophic changes in chronic white matter ischemic change is seen.  No findings to suggest acute hemorrhage, acute infarction or space-occupying mass lesion are noted.  IMPRESSION: Chronic changes without acute abnormality.   Original Report Authenticated By: Inez Catalina, M.D.   Dg Humerus Right  03/24/2013   *RADIOLOGY REPORT*  Clinical Data: Traumatic injury with pain  RIGHT HUMERUS - 2+ VIEW  Comparison: None.  Findings: No acute fracture or dislocation is noted.  No soft tissue changes are seen.  IMPRESSION: No acute abnormality noted.   Original Report Authenticated By: Inez Catalina, M.D.    EKG: Independently reviewed. Normal sinus rhythm.  Assessment/Plan Active Problems:   Syncope   Dizziness   Elevated blood pressure   1. Syncope - cause is not clear. Monitor in telemetry for any arrhythmias. Check orthostatics. Check 2-D echo and carotid Dopplers. 2. Elevated blood pressure - closely followed trends. For now I have placed patient on when necessary IV hydralazine for systolic blood pressure more than 180. 3. History of polymyalgia rheumatica - on prednisone. 4. History of ulcerative colitis - no acute flare up at this time.   Code Status:  Full code.  Family Communication:  Patient's family at the bedside.   Disposition Plan:  Admit for observation.    Nalda Shackleford N. [facility] Pager [phone].  If 7PM-7AM, please contact night-coverage [email or web address] Password [facility] 03/24/2013, 9:01 PM

## 2013-03-24 NOTE — ED Notes (Signed)
Pt. Reports she was on her porch and then found herself on the ground lying on her L side causing injury to the L arm and soreness in the L side of her face.

## 2013-03-24 NOTE — ED Notes (Signed)
Attempt to  call report , nurse not available

## 2013-03-24 NOTE — ED Notes (Signed)
Pt. Is c/o being a little dizzy and is alert and oriented with movement in all limbs WNL.   No slurred speech and no c/o chest pain.

## 2013-03-24 NOTE — ED Provider Notes (Signed)
CSN: [id number]     Arrival date & time 03/24/13  1344 History   First MD Initiated Contact with Patient 03/24/13 1421     Chief Complaint  Patient presents with  . Loss of Consciousness  . Fall   (Consider location/radiation/quality/duration/timing/severity/associated sxs/prior Treatment) HPI Comments: Pt had syncope episode today proceeded by sudden onset dizziness that lead to her falling off 50f high porch, landing on head & L shoulder. Pt complains of h/a, R shoulder pain.   Patient is a 79y.o. female presenting with syncope and fall. The history is provided by the patient and a friend. No language interpreter was used.  Loss of Consciousness Episode history:  Single Most recent episode:  Today Timing:  Rare Progression:  Resolved Chronicity:  New Context: normal activity   Witnessed: no   Relieved by:  Lying down Worsened by:  Nothing tried Ineffective treatments:  None tried Associated symptoms: dizziness and headaches   Associated symptoms: no anxiety, no chest pain, no confusion, no diaphoresis, no difficulty breathing, no fever, no focal weakness, no nausea, no palpitations, no shortness of breath, no visual change, no vomiting and no weakness   Fall This is a new problem. The current episode started 3 to 5 hours ago. The problem has been rapidly improving. Associated symptoms include headaches. Pertinent negatives include no chest pain, no abdominal pain and no shortness of breath. Nothing aggravates the symptoms. Nothing relieves the symptoms. She has tried nothing for the symptoms. The treatment provided no relief.    Past Medical History  Diagnosis Date  . Ulcerative colitis 1970    takes Colazal bid  . Diverticulosis   . Hemorrhoids   . Polymyalgia rheumatica   . History of [place] fever     Elevated titer 04/2006  . 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 Hoarseness     pt states always like this;ENT can't find anything wrong  . Joint  pain   . Joint swelling   . Bruises easily   . Gastric ulcer     hx of ;many yrs ago  . Hx of colonic polyps   . Nocturia   . Arthritis     takes Prednisone daily for inflammation  . Fibromyalgia   . Spine disorder     has had injections and wears a brace   Past Surgical History  Procedure Laterality Date  . Tubal ligation    . Cystectomy      Breast x 2-Right  . Partial hysterectomy      at age 79 . Epidural injections      x 2   . Carpal tunnel release      bilateral  . Colonoscopy    . Cataract extraction, bilateral    . Total knee arthroplasty  04/06/2012    Procedure: TOTAL KNEE ARTHROPLASTY;  Surgeon: PGarald Balding MD;  [facility]  Service: Orthopedics;  Laterality: Left;   Family History  Problem Relation Age of Onset  . Heart disease Father   . Heart disease Mother   . Breast cancer Sister   . Breast cancer Daughter   . Breast cancer Other     neice  . Colon polyps Other     nephew  . Colon cancer Neg Hx    History  Substance Use Topics  . Smoking status: Never Smoker   . Smokeless tobacco: Never Used  . Alcohol Use: No   OB History   Grav Para Term  Preterm Abortions TAB SAB Ect Mult Living                 Review of Systems  Constitutional: Negative for fever, chills, diaphoresis, activity change, appetite change and fatigue.  HENT: Negative for congestion, sore throat, facial swelling, rhinorrhea, neck pain and neck stiffness.   Eyes: Negative for photophobia and discharge.  Respiratory: Negative for cough, chest tightness and shortness of breath.   Cardiovascular: Positive for syncope. Negative for chest pain, palpitations and leg swelling.  Gastrointestinal: Negative for nausea, vomiting, abdominal pain and diarrhea.  Endocrine: Negative for polydipsia and polyuria.  Genitourinary: Negative for dysuria, frequency, difficulty urinating and pelvic pain.  Musculoskeletal: Negative for back pain and arthralgias.  Skin: Negative for color  change and wound.  Allergic/Immunologic: Negative for immunocompromised state.  Neurological: Positive for dizziness, syncope and headaches. Negative for focal weakness, facial asymmetry, weakness and numbness.  Hematological: Does not bruise/bleed easily.  Psychiatric/Behavioral: Negative for confusion and agitation.    Allergies  Review of patient's allergies indicates no known allergies.  Home Medications   Current Outpatient Rx  Name  Route  Sig  Dispense  Refill  . balsalazide (COLAZAL) 750 MG capsule   Oral   Take 3 capsules (2,250 mg total) by mouth 3 (three) times daily.   270 capsule   11   . diclofenac sodium (VOLTAREN) 1 % GEL   Topical   Apply topically 2 (two) times daily.         Marland Kitchen estradiol (ESTRACE) 0.5 MG tablet   Oral   Take 0.5 mg by mouth daily.           . predniSONE (DELTASONE) 10 MG tablet   Oral   Take 5 mg by mouth daily.           BP 163/70  Pulse 65  Temp(Src) 97.6 F (36.4 C) (Oral)  Resp 18  Ht 5' 1"  (1.549 m)  Wt 137 lb (62.143 kg)  BMI 25.9 kg/m2  SpO2 100% Physical Exam  Constitutional: She is oriented to person, place, and time. She appears well-developed and well-nourished. No distress.  HENT:  Head: Normocephalic and atraumatic.  Mouth/Throat: No oropharyngeal exudate.  Eyes: Pupils are equal, round, and reactive to light.  Neck: Normal range of motion. Neck supple.  Cardiovascular: Normal rate, regular rhythm and normal heart sounds.  Exam reveals no gallop and no friction rub.   No murmur heard. Pulmonary/Chest: Effort normal and breath sounds normal. No respiratory distress. She has no wheezes. She has no rales.  Abdominal: Soft. Bowel sounds are normal. She exhibits no distension and no mass. There is no tenderness. There is no rebound and no guarding.  Musculoskeletal: Normal range of motion. She exhibits no edema and no tenderness.  Neurological: She is alert and oriented to person, place, and time. She has normal  strength. She displays no tremor. No cranial nerve deficit or sensory deficit. She exhibits normal muscle tone. Coordination and gait normal. GCS eye subscore is 4. GCS verbal subscore is 5. GCS motor subscore is 6.  Reports worsening dizziness while standing  Skin: Skin is warm and dry.  Psychiatric: She has a normal mood and affect.    ED Course  Procedures (including critical care time) Labs Review Labs Reviewed  URINALYSIS, ROUTINE W REFLEX MICROSCOPIC - Abnormal; Notable for the following:    Specific Gravity, Urine 1.002 (*)    All other components within normal limits  CBC WITH DIFFERENTIAL - Abnormal; Notable  for the following:    MCV 101.4 (*)    All other components within normal limits  BASIC METABOLIC PANEL - Abnormal; Notable for the following:    Glucose, Bld 125 (*)    GFR calc non Af Amer 79 (*)    All other components within normal limits  TROPONIN I   Imaging Review Dg Chest 2 View  03/24/2013   *RADIOLOGY REPORT*  Clinical Data: Recent injury with pain  CHEST - 2 VIEW  Comparison: 04/01/2012  Findings: The cardiac shadow is stable.  The lungs are well-aerated bilaterally without new infiltrate or pneumothorax.  No acute bony abnormality noted.  IMPRESSION: No acute abnormality noted.   Original Report Authenticated By: Inez Catalina, M.D.   Dg Shoulder Right  03/24/2013   *RADIOLOGY REPORT*  Clinical Data: Right shoulder pain  RIGHT SHOULDER - 2+ VIEW  Comparison: None.  Findings: No acute fracture or dislocation is noted.  Mild degenerate changes of the acromioclavicular joint are seen.  The humeral head is high-riding likely related to a chronic rotator cuff injury.  IMPRESSION: Chronic changes without acute abnormality.   Original Report Authenticated By: Inez Catalina, M.D.   Ct Head Wo Contrast  03/24/2013   *RADIOLOGY REPORT*  Clinical Data: Syncopal episode  CT HEAD WITHOUT CONTRAST  Technique:  Contiguous axial images were obtained from the base of the skull  through the vertex without contrast.  Comparison: None  Findings: The bony calvarium is intact.  No gross soft tissue abnormality is noted.  A calcified meningioma is noted within the right.  Mild atrophic changes in chronic white matter ischemic change is seen.  No findings to suggest acute hemorrhage, acute infarction or space-occupying mass lesion are noted.  IMPRESSION: Chronic changes without acute abnormality.   Original Report Authenticated By: Inez Catalina, M.D.   Dg Humerus Right  03/24/2013   *RADIOLOGY REPORT*  Clinical Data: Traumatic injury with pain  RIGHT HUMERUS - 2+ VIEW  Comparison: None.  Findings: No acute fracture or dislocation is noted.  No soft tissue changes are seen.  IMPRESSION: No acute abnormality noted.   Original Report Authenticated By: Inez Catalina, M.D.     Date: 03/24/2013  Rate: 63  Rhythm: normal sinus rhythm  QRS Axis: normal  Intervals: normal  ST/T Wave abnormalities: nonspecific T wave changes and TW flattening III,   Conduction Disutrbances:none  Narrative Interpretation:   Old EKG Reviewed: none available    MDM   1. Syncope   2. Dizziness    Pt is a 79 y.o. female with Pmhx as above who presents with sudden onset dizziness followed by syncopal episode leading to fall off 3 ft porch, landing on head & shoulder.  No other preceding symptoms, no hx of prior.  No recent illness, no known GI bleeding.  No focal neuro findings on exam, but continues to have underlying dizziness.  EKG unremarkable, CT head and radiographs also unremarkable for acute changes. Hb stable, no e-lyte derangement, no UTI.  Given age, there is concern for cardiogenic syncope, but also for posterior circulation CVA given continued dizziness.  Have spoken to Dr. Daleen Bo w/ [facility] who will accept pt transfer [facility] admission to [facility].      1. Syncope   2. Dizziness         Neta Ehlers, MD 03/24/13 [phone]

## 2013-03-25 DIAGNOSIS — R55 Syncope and collapse: Secondary | ICD-10-CM

## 2013-03-25 DIAGNOSIS — I517 Cardiomegaly: Secondary | ICD-10-CM

## 2013-03-25 LAB — BASIC METABOLIC PANEL
BUN: 16 mg/dL (ref 6–23)
CO2: 26 mEq/L (ref 19–32)
Calcium: 8.7 mg/dL (ref 8.4–10.5)
Chloride: 109 mEq/L (ref 96–112)
Creatinine, Ser: 0.66 mg/dL (ref 0.50–1.10)

## 2013-03-25 LAB — CBC
HCT: 38 % (ref 36.0–46.0)
MCHC: 33.4 g/dL (ref 30.0–36.0)
MCV: 100.8 fL — ABNORMAL HIGH (ref 78.0–100.0)
Platelets: 273 10*3/uL (ref 150–400)
RDW: 13.9 % (ref 11.5–15.5)
WBC: 8.3 10*3/uL (ref 4.0–10.5)

## 2013-03-25 NOTE — Progress Notes (Signed)
Utilization review completed.  

## 2013-03-25 NOTE — Progress Notes (Signed)
  Echocardiogram 2D Echocardiogram has been performed.  Kathleen Morales 03/25/2013, 12:07 PM

## 2013-03-25 NOTE — Discharge Summary (Signed)
Physician Discharge Summary  GAYLIA KASSEL IPJ:[id number] DOB: [birth year] DOA: 03/24/2013  PCP: Fae Pippin  Admit date: 03/24/2013 Discharge date: 03/25/2013  Time spent: >30 minutes  minutes  Recommendations for Outpatient Follow-up:  1. Follow up with pcp in 1-2 weeks  Discharge Diagnoses:  Active Problems:   Syncope   Dizziness   Elevated blood pressure   Discharge Condition: stable   Diet recommendation: heart healthy   Filed Weights   03/24/13 1405 03/24/13 2020  Weight: 62.143 kg (137 lb) 62.188 kg (137 lb 1.6 oz)    History of present illness:   79 y.o. female with history of ulcerative colitis in polymyalgia rheumatica presented with syncope likely vasovagal. Patient was at her house on the porch when patient suddenly felt dizzy and fell. Patient stated that she lost consciousness before she fell. She may have lost consciousness for a few seconds;     Hospital Course:   1. Syncope - likely vasovagal, also patient was eating drinking enough that day; no exertional , symptoms,no focal neuro symptoms; 2-D echo normal LVEF; carotid Doppler shower 40-59 % R, L <39%; unlikely related to her presentation  -no new symptoms during hospitalization; patient is being d/c in stable condition to f/u with PCP in 1-2 weeks   2. Elevated blood pressure - likely stress related; resolved; outpatient monitoring to Dx underlying HTN 3. History of polymyalgia rheumatica - on prednisone. 4. History of ulcerative colitis - no acute flare up at this time.     Procedures:  CT head, echo  Consultations:  None   Discharge Exam: Filed Vitals:   03/25/13 1438  BP: 133/64  Pulse: 81  Temp:   Resp:     General: alert awake,  Cardiovascular: S1S2 RRR Respiratory: CTA BL  Discharge Instructions  Discharge Orders   Future Orders Complete By Expires   Diet - low sodium heart healthy  As directed    Discharge instructions  As directed    Comments:     Follow up  with primary care doctor in 1-2 weeks   Increase activity slowly  As directed        Medication List         balsalazide 750 MG capsule  Commonly known as:  COLAZAL  Take 3 capsules (2,250 mg total) by mouth 3 (three) times daily.     BIOTIN PO  Take 1 tablet by mouth daily.     CALCIUM PO  Take 1 tablet by mouth daily.     estradiol 0.5 MG tablet  Commonly known as:  ESTRACE  Take 0.5 mg by mouth daily.     predniSONE 10 MG tablet  Commonly known as:  DELTASONE  Take 5 mg by mouth daily.     VITAMIN B 12 PO  Take 1 tablet by mouth daily.     VITAMIN D PO  Take 1 tablet by mouth daily.     VOLTAREN 1 % Gel  Generic drug:  diclofenac sodium  Apply topically 2 (two) times daily.       No Known Allergies     Follow-up Information   Follow up with CONROY,NATHAN, PA-C In 1 week.   Specialty:  Physician Assistant   Contact information:   [address] [place] [address] [phone]        The results of significant diagnostics from this hospitalization (including imaging, microbiology, ancillary and laboratory) are listed below for reference.    Significant Diagnostic Studies: Dg Chest  2 View  03/24/2013   *RADIOLOGY REPORT*  Clinical Data: Recent injury with pain  CHEST - 2 VIEW  Comparison: 04/01/2012  Findings: The cardiac shadow is stable.  The lungs are well-aerated bilaterally without new infiltrate or pneumothorax.  No acute bony abnormality noted.  IMPRESSION: No acute abnormality noted.   Original Report Authenticated By: Inez Catalina, M.D.   Dg Shoulder Right  03/24/2013   *RADIOLOGY REPORT*  Clinical Data: Right shoulder pain  RIGHT SHOULDER - 2+ VIEW  Comparison: None.  Findings: No acute fracture or dislocation is noted.  Mild degenerate changes of the acromioclavicular joint are seen.  The humeral head is high-riding likely related to a chronic rotator cuff injury.  IMPRESSION: Chronic changes without acute abnormality.    Original Report Authenticated By: Inez Catalina, M.D.   Ct Head Wo Contrast  03/24/2013   *RADIOLOGY REPORT*  Clinical Data: Syncopal episode  CT HEAD WITHOUT CONTRAST  Technique:  Contiguous axial images were obtained from the base of the skull through the vertex without contrast.  Comparison: None  Findings: The bony calvarium is intact.  No gross soft tissue abnormality is noted.  A calcified meningioma is noted within the right.  Mild atrophic changes in chronic white matter ischemic change is seen.  No findings to suggest acute hemorrhage, acute infarction or space-occupying mass lesion are noted.  IMPRESSION: Chronic changes without acute abnormality.   Original Report Authenticated By: Inez Catalina, M.D.   Dg Humerus Right  03/24/2013   *RADIOLOGY REPORT*  Clinical Data: Traumatic injury with pain  RIGHT HUMERUS - 2+ VIEW  Comparison: None.  Findings: No acute fracture or dislocation is noted.  No soft tissue changes are seen.  IMPRESSION: No acute abnormality noted.   Original Report Authenticated By: Inez Catalina, M.D.    Microbiology: No results found for this or any previous visit (from the past 240 hour(s)).   Labs: Basic Metabolic Panel:  Recent Labs Lab 03/24/13 1450 03/25/13 0500  NA 141 142  K 4.2 3.9  CL 104 109  CO2 30 26  GLUCOSE 125* 84  BUN 13 16  CREATININE 0.70 0.66  CALCIUM 10.0 8.7   Liver Function Tests: No results found for this basename: AST, ALT, ALKPHOS, BILITOT, PROT, ALBUMIN,  in the last 168 hours No results found for this basename: LIPASE, AMYLASE,  in the last 168 hours No results found for this basename: AMMONIA,  in the last 168 hours CBC:  Recent Labs Lab 03/24/13 1450 03/25/13 0500  WBC 9.0 8.3  NEUTROABS 6.1  --   HGB 14.1 12.7  HCT 42.4 38.0  MCV 101.4* 100.8*  PLT 292 273   Cardiac Enzymes:  Recent Labs Lab 03/24/13 1450  TROPONINI <0.30   BNP: BNP (last 3 results) No results found for this basename: PROBNP,  in the last 8760  hours CBG: No results found for this basename: GLUCAP,  in the last 168 hours     Signed:  Kinnie Feil  [facility] 03/25/2013, 3:19 PM

## 2013-03-25 NOTE — Progress Notes (Addendum)
*  PRELIMINARY RESULTS* Vascular Ultrasound Carotid Duplex (Doppler) has been completed.  Preliminary findings: Right: 40-59% ICA stenosis.  Left:  1-39% ICA stenosis.  Vertebral artery flow is antegrade.      Landry Mellow, RDMS, RVT  03/25/2013, 12:02 PM

## 2013-04-13 ENCOUNTER — Encounter: Payer: Self-pay | Admitting: Gastroenterology

## 2013-05-23 ENCOUNTER — Other Ambulatory Visit: Payer: Self-pay

## 2013-05-23 MED ORDER — BALSALAZIDE DISODIUM 750 MG PO CAPS: 2250.0000 mg | ORAL_CAPSULE | Freq: Three times a day (TID) | ORAL | Status: DC

## 2013-08-08 ENCOUNTER — Telehealth: Payer: Self-pay | Admitting: Gastroenterology

## 2013-08-08 ENCOUNTER — Encounter: Payer: Self-pay | Admitting: Physician Assistant

## 2013-08-08 ENCOUNTER — Ambulatory Visit (INDEPENDENT_AMBULATORY_CARE_PROVIDER_SITE_OTHER): Payer: Medicare HMO | Admitting: Physician Assistant

## 2013-08-08 VITALS — BP 112/62 | HR 68 | Ht 61.0 in | Wt 143.6 lb

## 2013-08-08 DIAGNOSIS — K5190 Ulcerative colitis, unspecified, without complications: Secondary | ICD-10-CM

## 2013-08-08 DIAGNOSIS — K625 Hemorrhage of anus and rectum: Secondary | ICD-10-CM

## 2013-08-08 MED ORDER — BALSALAZIDE DISODIUM 750 MG PO CAPS: 2250.0000 mg | ORAL_CAPSULE | Freq: Three times a day (TID) | ORAL | Status: DC

## 2013-08-08 MED ORDER — MESALAMINE 4 G RE ENEM: 4.0000 g | ENEMA | Freq: Every day | RECTAL | Status: DC

## 2013-08-08 NOTE — Progress Notes (Signed)
Reviewed and agree with management plan.  Wynetta Seith T. Jenice Leiner, MD FACG 

## 2013-08-08 NOTE — Patient Instructions (Signed)
We have sent the following medications to your pharmacy for you to pick up at your convenience: Prednisone, Rowasa Enema, Colazal. Please take your Prednisone 20 mg for the next 2 weeks, then 15 mg for the next 2 weeks, then 10 mg for the next 2 weeks, then decrease to 5 mg daily. Follow up in 3-4 weeks with Dr Fuller Plan or Nicoletta Ba PA. CC:  [name] MD

## 2013-08-08 NOTE — Telephone Encounter (Signed)
Patient reports that she has had an incontinent rectal discharge for the last week and a half.  She reports discharge "sometimes its bloody, yellow, or clear.  She will come in and see Amy Esterwood PA today at 1:30

## 2013-08-08 NOTE — Progress Notes (Signed)
Subjective:    Patient ID: Kathleen Morales, female    DOB: 01/15/1929, 80 y.o.   MRN: [id number]  HPI  Kathleen Morales is a very nice 80 year old white female known to Dr. Fuller Plan with history of left-sided ulcerative colitis and history of colon polyps as well as diverticulosis. Her last colonoscopy was done in 2012 showing mild colitis in the rectum and sigmoid. She has been maintained on: Colazal 3 tablets 3 times daily,and has done well over the past year. She does stay on chronic prednisone 5 mg daily for polymyalgia rheumatica. She states that she started having a flare of her symptoms in late December. She is concerned because she has been using some Voltaren gel for knee pain which she says is quite effective. She has not been on any oral NSAIDs or aspirin.She did take a course of amoxicillin in mid December for an upper respiratory infection. She says now over the past couple of weeks she's been having intermittent lower abdominal cramping and soft or  runny bowel movements 2-3 times daily. Her normal is 1 bowel movement daily. She says she's also having several episodes per day of small amounts of bloody drainage from her rectum. She says most of the time this looks like reddish tinged blood at other times it is clear . She is finding this bothersome because it just seeps or from her rectum it is not associated with bowel movements. She's not had any fever. Her appetite has been fine.    Review of Systems  Constitutional: Negative.   HENT: Negative.   Eyes: Negative.   Respiratory: Negative.   Cardiovascular: Negative.   Gastrointestinal: Positive for diarrhea and blood in stool.  Endocrine: Negative.   Genitourinary: Negative.   Musculoskeletal: Positive for arthralgias and joint swelling.  Allergic/Immunologic: Negative.   Neurological: Negative.   Hematological: Negative.   Psychiatric/Behavioral: Negative.    Outpatient Prescriptions Prior to Visit  Medication Sig Dispense Refill  .  BIOTIN PO Take 1 tablet by mouth daily.      Marland Kitchen CALCIUM PO Take 1 tablet by mouth daily.      . Cholecalciferol (VITAMIN D PO) Take 1 tablet by mouth daily.      . Cyanocobalamin (VITAMIN B 12 PO) Take 1 tablet by mouth daily.      . diclofenac sodium (VOLTAREN) 1 % GEL Apply topically 2 (two) times daily.      Marland Kitchen estradiol (ESTRACE) 0.5 MG tablet Take 0.5 mg by mouth daily.        . predniSONE (DELTASONE) 10 MG tablet Take 5 mg by mouth daily.       . balsalazide (COLAZAL) 750 MG capsule Take 3 capsules (2,250 mg total) by mouth 3 (three) times daily.  270 capsule  5   No facility-administered medications prior to visit.   No Known Allergies Patient Active Problem List   Diagnosis Date Noted  . Syncope 03/24/2013  . Dizziness 03/24/2013  . Elevated blood pressure 03/24/2013  . Osteoarthritis of knee 04/05/2012  . Preoperative clearance 03/17/2012  . Varicose veins 03/17/2011  . Claudication 03/17/2011  . Dyspnea 03/17/2011  . POLYMYALGIA RHEUMATICA 11/15/2009  . ULCERATIVE COLITIS-LEFT SIDE 08/15/2008  . PERSONAL HX COLONIC POLYPS 08/15/2008      History  Substance Use Topics  . Smoking status: Never Smoker   . Smokeless tobacco: Never Used  . Alcohol Use: Yes     Comment: rare    Objective:   Physical Exam  Well-developed elderly white female  in no acute distress very pleasant blood pressure 112/62 pulse 68 height 5 foot 1 weight 143. HEENT ;nontraumatic normocephalic EOMI PERRLA sclera anicteric, Supple; no JVD, Cardiovascular ;regular rate and rhythm with S1-S2 no murmur or gallop, Pulmonary; clear bilaterally, Abdomen; soft she is tender in the left lower quadrant left mid quadrant there is no guarding or rebound no palpable mass or hepatosplenomegaly, Rectal; no external lesion or fistula noted there is mucoid heme on the examining glove, Extremities; no clubbing cyanosis or edema skin warm and dry, Psych; mood and affect appropriate        Assessment & Plan:  #5   81 year old white female with left-sided ulcerative colitis with exacerbation and symptoms of lower abdominal  cramping more frequent bowel movements and mucoid bloody discharge from rectum  #2 history of tubovillous adenoma 1998 #3 chronic low-dose steroid use for polymyalgia rheumatica  Plan; continue Colazal  750 mg, 3 tablets 3 times daily. Patient says her insurance  coverage has changed and Colazal was  quite expensive this past month. She will check her formulary but could switch to another mesalamine Since patient is already on prednisone Will increase to 20 mg daily for acute exacerbation of colitis. Plan 20 mg once daily x2 weeks then taper by 5 mg every 2 weeks until she's back to 5 mg per day Rowasa enemas each bedtime x1 month Followup with Dr. Fuller Plan or myself in 3-4 weeks. Patient is asked to call in 10-14 days if  she does not feel she's making any improvement or if at any point she feels she's worsening.

## 2013-08-11 ENCOUNTER — Other Ambulatory Visit: Payer: Self-pay | Admitting: *Deleted

## 2013-08-11 ENCOUNTER — Telehealth: Payer: Self-pay | Admitting: Physician Assistant

## 2013-08-11 MED ORDER — PREDNISONE 10 MG PO TABS: ORAL_TABLET | ORAL | Status: DC

## 2013-08-11 NOTE — Telephone Encounter (Signed)
I phoned the pharmacist, Clair Gulling, at [facility] with the Prednisone prescription from when he saw Nicoletta Ba PA-C on 08-08-2013.  I phoned the patient as well, and apologized for not getting the prescription at the pharmacy until today.  She said that was OK.  I told her it should be ready for her to pick up.

## 2013-09-05 ENCOUNTER — Other Ambulatory Visit (INDEPENDENT_AMBULATORY_CARE_PROVIDER_SITE_OTHER): Payer: Medicare HMO

## 2013-09-05 ENCOUNTER — Encounter: Payer: Self-pay | Admitting: Gastroenterology

## 2013-09-05 ENCOUNTER — Ambulatory Visit (INDEPENDENT_AMBULATORY_CARE_PROVIDER_SITE_OTHER): Payer: Medicare HMO | Admitting: Gastroenterology

## 2013-09-05 VITALS — BP 138/78 | HR 68 | Ht 61.0 in | Wt 143.8 lb

## 2013-09-05 DIAGNOSIS — R1032 Left lower quadrant pain: Secondary | ICD-10-CM

## 2013-09-05 DIAGNOSIS — K5190 Ulcerative colitis, unspecified, without complications: Secondary | ICD-10-CM

## 2013-09-05 DIAGNOSIS — R142 Eructation: Secondary | ICD-10-CM

## 2013-09-05 DIAGNOSIS — R143 Flatulence: Secondary | ICD-10-CM

## 2013-09-05 DIAGNOSIS — R141 Gas pain: Secondary | ICD-10-CM

## 2013-09-05 DIAGNOSIS — R140 Abdominal distension (gaseous): Secondary | ICD-10-CM

## 2013-09-05 LAB — CBC WITH DIFFERENTIAL/PLATELET
BASOS ABS: 0 10*3/uL (ref 0.0–0.1)
Basophils Relative: 0.3 % (ref 0.0–3.0)
EOS ABS: 0 10*3/uL (ref 0.0–0.7)
Eosinophils Relative: 0.4 % (ref 0.0–5.0)
HCT: 42.3 % (ref 36.0–46.0)
Hemoglobin: 14.1 g/dL (ref 12.0–15.0)
LYMPHS PCT: 20.6 % (ref 12.0–46.0)
Lymphs Abs: 1.9 10*3/uL (ref 0.7–4.0)
MCHC: 33.4 g/dL (ref 30.0–36.0)
MCV: 99.6 fl (ref 78.0–100.0)
Monocytes Absolute: 0.5 10*3/uL (ref 0.1–1.0)
Monocytes Relative: 4.9 % (ref 3.0–12.0)
NEUTROS PCT: 73.8 % (ref 43.0–77.0)
Neutro Abs: 6.9 10*3/uL (ref 1.4–7.7)
Platelets: 304 10*3/uL (ref 150.0–400.0)
RBC: 4.25 Mil/uL (ref 3.87–5.11)
RDW: 14.1 % (ref 11.5–14.6)
WBC: 9.4 10*3/uL (ref 4.5–10.5)

## 2013-09-05 LAB — HEPATIC FUNCTION PANEL
ALBUMIN: 3.6 g/dL (ref 3.5–5.2)
ALK PHOS: 55 U/L (ref 39–117)
ALT: 14 U/L (ref 0–35)
AST: 19 U/L (ref 0–37)
Bilirubin, Direct: 0 mg/dL (ref 0.0–0.3)
TOTAL PROTEIN: 6.3 g/dL (ref 6.0–8.3)
Total Bilirubin: 0.5 mg/dL (ref 0.3–1.2)

## 2013-09-05 LAB — BASIC METABOLIC PANEL
BUN: 15 mg/dL (ref 6–23)
CO2: 28 meq/L (ref 19–32)
Calcium: 9 mg/dL (ref 8.4–10.5)
Chloride: 105 mEq/L (ref 96–112)
Creatinine, Ser: 0.8 mg/dL (ref 0.4–1.2)
GFR: 72.79 mL/min (ref >60.00)
GLUCOSE: 147 mg/dL — AB (ref 70–99)
POTASSIUM: 3.5 meq/L (ref 3.5–5.1)
SODIUM: 141 meq/L (ref 135–145)

## 2013-09-05 LAB — TSH: TSH: 0.64 u[IU]/mL (ref 0.35–5.50)

## 2013-09-05 MED ORDER — BUDESONIDE 9 MG PO TB24: 1.0000 | ORAL_TABLET | Freq: Every day | ORAL | Status: DC

## 2013-09-05 NOTE — Patient Instructions (Signed)
Your physician has requested that you go to the basement for the following lab work before leaving today:[facility] panel.  Start taking Uceris 9 mg  samples one tablet by mouth once daily x 8 weeks. A prescription was sent to your pharmacy also.  You have been scheduled for a CT scan of the abdomen and pelvis at [facility] ([address]---this is in the same building as [facility]).   You are scheduled on 09/08/13 at 9:00am. You should arrive 15 minutes prior to your appointment time for registration. Please follow the written instructions below on the day of your exam:  WARNING: IF YOU ARE ALLERGIC TO IODINE/X-RAY DYE, PLEASE NOTIFY RADIOLOGY IMMEDIATELY AT [phone]! YOU WILL BE GIVEN A 13 HOUR PREMEDICATION PREP.  1) Do not eat or drink anything after 5:00am (4 hours prior to your test) 2) You have been given 2 bottles of oral contrast to drink. The solution may taste better if refrigerated, but do NOT add ice or any other liquid to this solution. Shake well before drinking.    Drink 1 bottle of contrast @ 7:00am (2 hours prior to your exam)  Drink 1 bottle of contrast @ 8:00am (1 hour prior to your exam)  You may take any medications as prescribed with a small amount of water except for the following: Metformin, Glucophage, Glucovance, Avandamet, Riomet, Fortamet, Actoplus Met, Janumet, Glumetza or Metaglip. The above medications must be held the day of the exam AND 48 hours after the exam.  The purpose of you drinking the oral contrast is to aid in the visualization of your intestinal tract. The contrast solution may cause some diarrhea. Before your exam is started, you will be given a small amount of fluid to drink. Depending on your individual set of symptoms, you may also receive an intravenous injection of x-ray contrast/dye. Plan on being at [facility] for 30 minutes or long, depending on the type of exam you are having performed.  This test  typically takes 30-45 minutes to complete.  If you have any questions regarding your exam or if you need to reschedule, you may call the CT department at [phone] between the hours of 8:00 am and 5:00 pm, Monday-Friday.  ________________________________________________________________________  Thank you for choosing me and [facility].  Pricilla Riffle. Dagoberto Ligas., MD., Marval Regal

## 2013-09-05 NOTE — Progress Notes (Signed)
    History of Present Illness: This is an 80 year old white female here today with her female friend. She relates persistent problems with bloody diarrhea. She did note mild improvement in symptoms while on prednisone 20 mg daily but her symptoms worsened since she completed her prednisone taper. She notes frequent left lower quadrant pain and a full and bloated sensation across her entire abdomen. She denies any recent antibiotic usage.  Current Medications, Allergies, Past Medical History, Past Surgical History, Family History and Social History were reviewed in [employer] record.  Physical Exam: General: Well developed , well nourished, no acute distress Head: Normocephalic and atraumatic Eyes:  sclerae anicteric, EOMI Ears: Normal auditory acuity Mouth: No deformity or lesions Lungs: Clear throughout to auscultation Heart: Regular rate and rhythm; no murmurs, rubs or bruits Abdomen: Soft, non tender and non distended. No masses, hepatosplenomegaly or hernias noted. Normal Bowel sounds Musculoskeletal: Symmetrical with no gross deformities  Pulses:  Normal pulses noted Extremities: No clubbing, cyanosis, edema or deformities noted Neurological: Alert oriented x 4, grossly nonfocal Psychological:  Alert and cooperative. Normal mood and affect  Assessment and Recommendations:  1. Ulcerative colitis. Begin Uceris 9 mg daily. If she cannot obtain this medication due to cost or other reasons begin prednisone 40 mg daily tapering by 10 mg every 10 days. Continue balsalazide at current dosage. Obtain standard blood work. His symptoms don't respond above obtain stool studies and consider colonoscopy.  2. Left sided abdominal pain and generalized fullness. Etiology unclear. Rule out abdominal mass. Schedule abdominal/pelvic CT scan.

## 2013-09-08 ENCOUNTER — Ambulatory Visit (INDEPENDENT_AMBULATORY_CARE_PROVIDER_SITE_OTHER)
Admission: RE | Admit: 2013-09-08 | Discharge: 2013-09-08 | Disposition: A | Discharge status: Home / Self Care | Payer: Medicare HMO | Source: Ambulatory Visit | Attending: Gastroenterology | Admitting: Gastroenterology

## 2013-09-08 DIAGNOSIS — R142 Eructation: Secondary | ICD-10-CM

## 2013-09-08 DIAGNOSIS — R140 Abdominal distension (gaseous): Secondary | ICD-10-CM

## 2013-09-08 DIAGNOSIS — R143 Flatulence: Secondary | ICD-10-CM

## 2013-09-08 DIAGNOSIS — K5190 Ulcerative colitis, unspecified, without complications: Secondary | ICD-10-CM

## 2013-09-08 DIAGNOSIS — R1032 Left lower quadrant pain: Secondary | ICD-10-CM

## 2013-09-08 DIAGNOSIS — R141 Gas pain: Secondary | ICD-10-CM

## 2013-09-08 MED ORDER — IOHEXOL 300 MG/ML  SOLN
100.0000 mL | Freq: Once | INTRAMUSCULAR | Status: AC | PRN
  Administered 2013-09-08: 100 mL via INTRAVENOUS

## 2013-09-09 ENCOUNTER — Other Ambulatory Visit: Payer: Self-pay

## 2013-09-09 DIAGNOSIS — R140 Abdominal distension (gaseous): Secondary | ICD-10-CM

## 2013-09-09 DIAGNOSIS — R1032 Left lower quadrant pain: Secondary | ICD-10-CM

## 2013-09-09 DIAGNOSIS — K5190 Ulcerative colitis, unspecified, without complications: Secondary | ICD-10-CM

## 2013-09-09 MED ORDER — BUDESONIDE 9 MG PO TB24: 1.0000 | ORAL_TABLET | Freq: Every day | ORAL | Status: DC

## 2013-09-12 ENCOUNTER — Telehealth: Payer: Self-pay | Admitting: Gastroenterology

## 2013-09-12 NOTE — Telephone Encounter (Signed)
Left message for patient to call back  

## 2013-09-14 NOTE — Telephone Encounter (Signed)
Patient advised that she should call when she is on her last week of Uceris with an update

## 2013-10-05 ENCOUNTER — Telehealth: Payer: Self-pay | Admitting: Gastroenterology

## 2013-10-05 DIAGNOSIS — R140 Abdominal distension (gaseous): Secondary | ICD-10-CM

## 2013-10-05 DIAGNOSIS — K5190 Ulcerative colitis, unspecified, without complications: Secondary | ICD-10-CM

## 2013-10-05 DIAGNOSIS — R1032 Left lower quadrant pain: Secondary | ICD-10-CM

## 2013-10-05 MED ORDER — BUDESONIDE 9 MG PO TB24
1.0000 | ORAL_TABLET | Freq: Every day | ORAL | Status: DC
Start: 2013-10-05 — End: 2013-11-16

## 2013-10-05 NOTE — Telephone Encounter (Signed)
Patient is provided 14 more days of samples she will come and pick them up

## 2013-10-20 ENCOUNTER — Telehealth: Payer: Self-pay | Admitting: Gastroenterology

## 2013-10-20 NOTE — Telephone Encounter (Signed)
22 days of sample left up front for patient to pick up and patient will pick up today. Told patient to still call in 3 weeks after she has finished her 8 week coarse with an update on her symptoms. Pt agreed and verbalized understanding.

## 2013-11-11 ENCOUNTER — Telehealth: Payer: Self-pay | Admitting: Gastroenterology

## 2013-11-11 NOTE — Telephone Encounter (Signed)
Patient reports no further diarrhea.  She is due for follow up.  She will follow up with Dr. Fuller Plan on [facility] 4/22

## 2013-11-11 NOTE — Telephone Encounter (Signed)
Left message for patient to call back  

## 2013-11-16 ENCOUNTER — Ambulatory Visit (INDEPENDENT_AMBULATORY_CARE_PROVIDER_SITE_OTHER): Payer: Medicare HMO | Admitting: Gastroenterology

## 2013-11-16 ENCOUNTER — Encounter: Payer: Self-pay | Admitting: Gastroenterology

## 2013-11-16 ENCOUNTER — Telehealth: Payer: Self-pay | Admitting: *Deleted

## 2013-11-16 VITALS — BP 122/68 | HR 80 | Ht 61.0 in | Wt 142.0 lb

## 2013-11-16 DIAGNOSIS — K5150 Left sided colitis without complications: Secondary | ICD-10-CM

## 2013-11-16 MED ORDER — PREDNISONE 5 MG PO TABS: 5.0000 mg | ORAL_TABLET | Freq: Every day | ORAL | Status: DC

## 2013-11-16 NOTE — Patient Instructions (Signed)
We have sent the following medications to your pharmacy for you to pick up at your convenience: Prednisone 5 mg daily. Please continue this medication until you see Dr. Estanislado Pandy.  Please follow up with Dr. Estanislado Pandy.  Thank you for choosing me and [facility].  Pricilla Riffle. Dagoberto Ligas., MD., Marval Regal

## 2013-11-16 NOTE — Progress Notes (Signed)
    History of Present Illness: This is an 80 year old female with left-sided ulcerative colitis has had 2 recent flare is of her disease. Gastrointestinal complaints are now under very good control. Due to a miscommunication her chronic prednisone 5 mg daily for PMR was discontinued. Patient notes substantial stiffness and discomfort in her shoulders, neck and hips consistent with PMR.  Current Medications, Allergies, Past Medical History, Past Surgical History, Family History and Social History were reviewed in [employer] record.  Physical Exam: General: Well developed , well nourished, no acute distress Head: Normocephalic and atraumatic Eyes:  sclerae anicteric, EOMI Ears: Normal auditory acuity Mouth: No deformity or lesions Lungs: Clear throughout to auscultation Heart: Regular rate and rhythm; no murmurs, rubs or bruits Abdomen: Soft, non tender and non distended. No masses, hepatosplenomegaly or hernias noted. Normal Bowel sounds Musculoskeletal: Symmetrical with no gross deformities  Pulses:  Normal pulses noted Extremities: No clubbing, cyanosis, edema or deformities noted Neurological: Alert oriented x 4, grossly nonfocal Psychological:  Alert and cooperative. Normal mood and affect  Assessment and Recommendations:  1. Ulcerative colitis, left-sided. Maintained balsalazide 750 mg 3 po tid long-term.  2. PMR. Resume prednisone 5 mg daily. Long-term management per her rheumatologist.

## 2014-03-09 ENCOUNTER — Telehealth: Payer: Self-pay

## 2014-03-09 DIAGNOSIS — K869 Disease of pancreas, unspecified: Secondary | ICD-10-CM

## 2014-03-09 NOTE — Telephone Encounter (Signed)
Patient is scheduled for MRI abdomen with and without for 03/20/14 3:00.  She is asked to arrive at 2:15 for lab work prior and to MRI.  This will be at [address] [address] .  She is advised to be NPO after 11:00 am

## 2014-03-09 NOTE — Telephone Encounter (Signed)
Message copied by Marlon Pel on Thu Mar 09, 2014  2:32 PM ------      Message from: Marlon Pel      Created: Fri Sep 09, 2013 10:32 AM       Follow up MRI from 2/12 ------

## 2014-03-20 ENCOUNTER — Encounter: Payer: Self-pay | Admitting: Physician Assistant

## 2014-03-20 ENCOUNTER — Ambulatory Visit
Admission: RE | Admit: 2014-03-20 | Discharge: 2014-03-20 | Disposition: A | Discharge status: Home / Self Care | Payer: Medicare HMO | Source: Ambulatory Visit | Attending: Gastroenterology | Admitting: Gastroenterology

## 2014-03-20 ENCOUNTER — Ambulatory Visit (INDEPENDENT_AMBULATORY_CARE_PROVIDER_SITE_OTHER): Payer: Medicare HMO | Admitting: Physician Assistant

## 2014-03-20 VITALS — BP 148/72 | HR 65 | Ht 62.0 in | Wt 142.0 lb

## 2014-03-20 DIAGNOSIS — IMO0001 Reserved for inherently not codable concepts without codable children: Secondary | ICD-10-CM

## 2014-03-20 DIAGNOSIS — Z01818 Encounter for other preprocedural examination: Secondary | ICD-10-CM

## 2014-03-20 DIAGNOSIS — R030 Elevated blood-pressure reading, without diagnosis of hypertension: Secondary | ICD-10-CM

## 2014-03-20 DIAGNOSIS — K869 Disease of pancreas, unspecified: Secondary | ICD-10-CM

## 2014-03-20 DIAGNOSIS — I739 Peripheral vascular disease, unspecified: Secondary | ICD-10-CM

## 2014-03-20 MED ORDER — GADOBENATE DIMEGLUMINE 529 MG/ML IV SOLN: 13.0000 mL | Freq: Once | INTRAVENOUS | Status: AC | PRN

## 2014-03-20 NOTE — Patient Instructions (Addendum)
Your physician wants you to follow-up in: 1 year with Dr. Blima Singer will receive a reminder letter in the mail two months in advance. If you don't receive a letter, please call our office to schedule the follow-up appointment.  Your physician recommends that you continue on your current medications as directed. Please refer to the Current Medication list given to you today.

## 2014-03-20 NOTE — Assessment & Plan Note (Signed)
Patient is here today for preoperative clearance before undergoing total right knee replacement by Dr. Durward Fortes. Patient has history of moderate TR and grade 1 diastolic dysfunction on 2-D echo in 2012. She had normal LV function EF 55-60%. She has had no symptoms since then. She remains quite active. I discussed this patient in detail with Dr. Johnsie Cancel who concurs that the patient may proceed with surgery without any further workup. She does have history of varicose veins. Recommend support hose and elevation. DVT prophylaxis post op.

## 2014-03-20 NOTE — Assessment & Plan Note (Signed)
No history of hypertension. BP up a little today. Low sodium diet. Followup with primary M.D.

## 2014-03-20 NOTE — Progress Notes (Signed)
HPI: This is an 80 year old female patient of Dr. Johnsie Cancel who is here for preoperative clearance before undergoing total right knee replacement by Dr. Durward Fortes. He saw her back in 2012 for clearance before undergoing left knee with replacement. She has a history of moderate TR and grade 1 diastolic dysfunction on 2-D echo in 2012. She has no history of coronary artery disease, hypertension, diabetes mellitus, CVA. She has a history of varicose veins, diverticula Moses, chronic ulcerative colitis and polymyalgia rheumatica for which he takes prednisone daily.  Patient works in her garden, [place] acres of lawn, and does all her own housework. She is limited now because of knee problems. She denies any chest pain, palpitations, dyspnea, dyspnea on exertion, dizziness, or presyncope.  No Known Allergies   Current Outpatient Prescriptions  Medication Sig Dispense Refill  . balsalazide (COLAZAL) 750 MG capsule Take 3 capsules (2,250 mg total) by mouth 3 (three) times daily.  270 capsule  5  . BIOTIN PO Take 1 tablet by mouth daily.      Marland Kitchen CALCIUM PO Take 1 tablet by mouth daily.      . Cholecalciferol (VITAMIN D PO) Take 1 tablet by mouth daily.      . Cyanocobalamin (VITAMIN B 12 PO) Take 1 tablet by mouth daily.      . diclofenac sodium (VOLTAREN) 1 % GEL Apply topically 2 (two) times daily.      Marland Kitchen estradiol (ESTRACE) 0.5 MG tablet Take 0.5 mg by mouth daily.        . predniSONE (DELTASONE) 5 MG tablet Take 1 tablet (5 mg total) by mouth daily with breakfast.  30 tablet  1   No current facility-administered medications for this visit.    Past Medical History  Diagnosis Date  . Ulcerative colitis 1970    takes Colazal bid  . Diverticulosis   . Hemorrhoids   . Polymyalgia rheumatica   . History of [place] spotted fever     Elevated titer 04/2006  . 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 Hoarseness     pt states always like this;ENT can't find anything wrong  .  Joint pain   . Joint swelling   . Bruises easily   . Gastric ulcer     hx of ;many yrs ago  . Hx of colonic polyps   . Nocturia   . Arthritis     takes Prednisone daily for inflammation  . Fibromyalgia   . Spine disorder     has had injections and wears a brace    Past Surgical History  Procedure Laterality Date  . Tubal ligation    . Cystectomy      Breast x 2-Right  . Partial hysterectomy      at age 74  . Epidural injections      x 2   . Carpal tunnel release      bilateral  . Colonoscopy    . Cataract extraction, bilateral    . Total knee arthroplasty  04/06/2012    Procedure: TOTAL KNEE ARTHROPLASTY;  Surgeon: [name], MD;  Location: [facility];  Service: Orthopedics;  Laterality: Left;    Family History  Problem Relation Age of Onset  . Heart disease Father   . Heart disease Mother   . Breast cancer Sister   . Breast cancer Daughter   . Breast cancer Other     neice  . Colon polyps Other  nephew  . Colon cancer Neg Hx     History   Social History  . Marital Status: Widowed    Spouse Name: N/A    Number of Children: 4  . Years of Education: N/A   Occupational History  . Chartered certified accountant    Social History Main Topics  . Smoking status: Never Smoker   . Smokeless tobacco: Never Used  . Alcohol Use: Yes     Comment: rare  . Drug Use: No  . Sexual Activity: No   Other Topics Concern  . Not on file   Social History Narrative   Daily caffeine use 1-2 cup/day   Gets regular exercise          ROS: See history of present illness otherwise negative  BP 148/72  Pulse 65  Ht 5' 2"  (1.575 m)  Wt 142 lb (64.411 kg)  BMI 25.97 kg/m2  PHYSICAL EXAM: Well-nournished, in no acute distress. Looks angry than stated age Neck: No JVD, HJR, Bruit, or thyroid enlargement  Lungs: No tachypnea, clear without wheezing, rales, or rhonchi  Cardiovascular: RRR, PMI not displaced, 1/6 systolic murmur at the left sternal border, no gallops, bruit, thrill,  or heave.  Abdomen: BS normal. Soft without organomegaly, masses, lesions or tenderness.  Extremities: without cyanosis, clubbing or edema. Good distal pulses bilateral  SKin: Warm, no lesions or rashes   Musculoskeletal: No deformities  Neuro: no focal signs   Wt Readings from Last 3 Encounters:  03/20/14 142 lb (64.411 kg)  11/16/13 142 lb (64.411 kg)  09/05/13 143 lb 12.8 oz (65.227 kg)     EKG: Normal sinus rhythm normal EKG   Echo 04/11/11 Study Conclusions  - Left ventricle: The cavity size was normal. Wall thickness was normal. Systolic function was normal. The estimated ejection fraction was in the range of 55% to 60%. Wall motion was normal; there were no regional wall motion abnormalities. Doppler parameters are consistent with abnormal left ventricular relaxation (grade 1 diastolic dysfunction). - Tricuspid valve: Moderate regurgitation.  Needs clearence for Left TKR with Dr Durward Fortes.  Low risk  Cleared.  She will go to [facility] for rehab.  Needs meticulous post op DVT prophylaxis given superficial varicosities

## 2014-04-24 ENCOUNTER — Encounter (HOSPITAL_COMMUNITY): Payer: Self-pay | Admitting: Pharmacy Technician

## 2014-04-26 ENCOUNTER — Encounter (HOSPITAL_COMMUNITY)
Admission: RE | Admit: 2014-04-26 | Discharge: 2014-04-26 | Disposition: A | Discharge status: Home / Self Care | Payer: Medicare HMO | Source: Ambulatory Visit | Attending: Orthopaedic Surgery | Admitting: Orthopaedic Surgery

## 2014-04-26 ENCOUNTER — Ambulatory Visit (HOSPITAL_COMMUNITY)
Admission: RE | Admit: 2014-04-26 | Discharge: 2014-04-26 | Disposition: A | Discharge status: Home / Self Care | Payer: Medicare HMO | Source: Ambulatory Visit | Attending: Orthopedic Surgery | Admitting: Orthopedic Surgery

## 2014-04-26 ENCOUNTER — Encounter (HOSPITAL_COMMUNITY): Payer: Self-pay

## 2014-04-26 DIAGNOSIS — M4120 Other idiopathic scoliosis, site unspecified: Secondary | ICD-10-CM | POA: Insufficient documentation

## 2014-04-26 DIAGNOSIS — Z96659 Presence of unspecified artificial knee joint: Secondary | ICD-10-CM | POA: Diagnosis not present

## 2014-04-26 DIAGNOSIS — Z01818 Encounter for other preprocedural examination: Secondary | ICD-10-CM | POA: Insufficient documentation

## 2014-04-26 DIAGNOSIS — IMO0002 Reserved for concepts with insufficient information to code with codable children: Secondary | ICD-10-CM | POA: Diagnosis not present

## 2014-04-26 LAB — URINALYSIS, ROUTINE W REFLEX MICROSCOPIC
Bilirubin Urine: NEGATIVE
Glucose, UA: NEGATIVE mg/dL
Hgb urine dipstick: NEGATIVE
KETONES UR: NEGATIVE mg/dL
Leukocytes, UA: NEGATIVE
NITRITE: NEGATIVE
PH: 5 (ref 5.0–8.0)
PROTEIN: NEGATIVE mg/dL
Specific Gravity, Urine: 1.018 (ref 1.005–1.030)
Urobilinogen, UA: 0.2 mg/dL (ref 0.0–1.0)

## 2014-04-26 LAB — SURGICAL PCR SCREEN
MRSA, PCR: NEGATIVE
STAPHYLOCOCCUS AUREUS: NEGATIVE

## 2014-04-26 LAB — COMPREHENSIVE METABOLIC PANEL
ALK PHOS: 66 U/L (ref 39–117)
ALT: 10 U/L (ref 0–35)
AST: 20 U/L (ref 0–37)
Albumin: 3.8 g/dL (ref 3.5–5.2)
Anion gap: 12 (ref 5–15)
BUN: 14 mg/dL (ref 6–23)
CO2: 27 mEq/L (ref 19–32)
Calcium: 9.9 mg/dL (ref 8.4–10.5)
Chloride: 101 mEq/L (ref 96–112)
Creatinine, Ser: 0.69 mg/dL (ref 0.50–1.10)
GFR calc non Af Amer: 78 mL/min — ABNORMAL LOW (ref >90)
GLUCOSE: 121 mg/dL — AB (ref 70–99)
POTASSIUM: 4 meq/L (ref 3.7–5.3)
SODIUM: 140 meq/L (ref 137–147)
Total Bilirubin: 0.4 mg/dL (ref 0.3–1.2)
Total Protein: 7.2 g/dL (ref 6.0–8.3)

## 2014-04-26 LAB — CBC WITH DIFFERENTIAL/PLATELET
BASOS ABS: 0 10*3/uL (ref 0.0–0.1)
Basophils Relative: 0 % (ref 0–1)
EOS ABS: 0.1 10*3/uL (ref 0.0–0.7)
Eosinophils Relative: 1 % (ref 0–5)
HCT: 44.3 % (ref 36.0–46.0)
Hemoglobin: 14.7 g/dL (ref 12.0–15.0)
LYMPHS ABS: 2.2 10*3/uL (ref 0.7–4.0)
LYMPHS PCT: 23 % (ref 12–46)
MCH: 33 pg (ref 26.0–34.0)
MCHC: 33.2 g/dL (ref 30.0–36.0)
MCV: 99.6 fL (ref 78.0–100.0)
Monocytes Absolute: 0.4 10*3/uL (ref 0.1–1.0)
Monocytes Relative: 4 % (ref 3–12)
NEUTROS PCT: 72 % (ref 43–77)
Neutro Abs: 6.8 10*3/uL (ref 1.7–7.7)
PLATELETS: 301 10*3/uL (ref 150–400)
RBC: 4.45 MIL/uL (ref 3.87–5.11)
RDW: 13.1 % (ref 11.5–15.5)
WBC: 9.5 10*3/uL (ref 4.0–10.5)

## 2014-04-26 LAB — PROTIME-INR
INR: 0.99 (ref 0.00–1.49)
Prothrombin Time: 13.1 seconds (ref 11.6–15.2)

## 2014-04-26 LAB — TYPE AND SCREEN
ABO/RH(D): O POS
Antibody Screen: NEGATIVE

## 2014-04-26 LAB — APTT: aPTT: 28 seconds (ref 24–37)

## 2014-04-26 MED ORDER — CHLORHEXIDINE GLUCONATE 4 % EX LIQD: 60.0000 mL | Freq: Once | CUTANEOUS | Status: DC

## 2014-04-26 NOTE — Pre-Procedure Instructions (Signed)
Kathleen Morales  04/26/2014   Your procedure is scheduled on:  Tues, Oct 6 @ 7:15 AM  Report to [facility]  at 5:30  AM.  Call this number if you have problems the morning of surgery: [phone]   Remember:   Do not eat food or drink liquids after midnight.   Take these medicines the morning of surgery with A SIP OF WATER: Colazal(Balsalazide) and Prednisone(Deltasone)               Stop taking your Vitamins or any Herbal Medications. Also No Goody's,BC's,Aleve,Aspirin,Ibuprofen,or any Fish Oil.   Do not wear jewelry,makeup,or nail polish.  Do not wear lotions, powders, or perfumes. You may wear deodorant.  Do Not shave 48 hrs before surgery.  Do not bring valuables to the hospital.  [facility] is not responsible                  for any belongings or valuables.               Contacts, dentures or bridgework may not be worn into surgery.  Leave suitcase in the car. After surgery it may be brought to your room.  For patients admitted to the hospital, discharge time is determined by your                treatment team.                  Special Instructions:  [place] - Preparing for Surgery  Before surgery, you can play an important role.  Because skin is not sterile, your skin needs to be as free of germs as possible.  You can reduce the number of germs on you skin by washing with CHG (chlorahexidine gluconate) soap before surgery.  CHG is an antiseptic cleaner which kills germs and bonds with the skin to continue killing germs even after washing.  Please DO NOT use if you have an allergy to CHG or antibacterial soaps.  If your skin becomes reddened/irritated stop using the CHG and inform your nurse when you arrive at Short Stay.  Do not shave (including legs and underarms) for at least 48 hours prior to the first CHG shower.  You may shave your face.  Please follow these instructions carefully:   1.  Shower with CHG Soap the night before surgery and the                                 morning of Surgery.  2.  If you choose to wash your hair, wash your hair first as usual with your       normal shampoo.  3.  After you shampoo, rinse your hair and body thoroughly to remove the                      Shampoo.  4.  Use CHG as you would any other liquid soap.  You can apply chg directly       to the skin and wash gently with scrungie or a clean washcloth.  5.  Apply the CHG Soap to your body ONLY FROM THE NECK DOWN.        Do not use on open wounds or open sores.  Avoid contact with your eyes,       ears, mouth and genitals (private parts).  Wash genitals (private parts)  with your normal soap.  6.  Wash thoroughly, paying special attention to the area where your surgery        will be performed.  7.  Thoroughly rinse your body with warm water from the neck down.  8.  DO NOT shower/wash with your normal soap after using and rinsing off       the CHG Soap.  9.  Pat yourself dry with a clean towel.            10.  Wear clean pajamas.            11.  Place clean sheets on your bed the night of your first shower and do not        sleep with pets.  Day of Surgery  Do not apply any lotions/deoderants the morning of surgery.  Please wear clean clothes to the hospital/surgery center.     Please read over the following fact sheets that you were given: Pain Booklet, Coughing and Deep Breathing, Blood Transfusion Information, MRSA Information and Surgical Site Infection Prevention

## 2014-04-27 NOTE — Progress Notes (Addendum)
Anesthesia Chart Review:  Patient is an 80 year old female scheduled for right TKR on 05/02/14 by Dr. Durward Fortes.  History includes non-smoker, ulcerative colitis, diverticulosis, polymyalgia rheumatica, [facility] Spotted Fever '07, hoarseness, bruises easily, fibromyalgia, arthritis, hysterectomy, left TKA '13. PCP is [name], PA-C. She was evaluated by cardiology by Ermalinda Barrios, PA-C/Dr. Johnsie Cancel on 03/20/14 for preoperative evaluation.  She was cleared without additional cardiac work-up. TEDS and DVT prophylaxis recommended post-operatively.    EKG on 03/20/14 showed: NSR.  Echo on 03/25/13 showed:  - Left ventricle: The cavity size was normal. Wall thickness was increased in a pattern of mild LVH. Systolic function was normal. The estimated ejection fraction was in the range of 55% to 60%. Wall motion was normal; there were no regional wall motion abnormalities. Doppler parameters are consistent with abnormal left ventricular relaxation (grade 1 diastolic dysfunction). - Mitral valve: Calcified annulus. - Tricuspid valve: Trivial regurgitation.  Carotid duplex on 03/25/13 showed:  - The vertebral arteries appear patent with antegrade flow. - Findings consistent with1- 39 percent stenosis involving the left internal carotid artery. - Findings consistent with 70 - 8 percent stenosis involving the right internal carotid artery. - ICA/CCA ratio. right = 1.78. left = 1.37.  Preoperative CXR and labs noted. Urine culture is still pending.  She has been cleared by cardiology.  If no acute changes then I anticipate that she can proceed as planned.  She is on chronic daily steroid therapy for PMR.   [facility] Short Stay Center/Anesthesiology Phone [phone] 04/27/2014 11:01 AM  Addendum:  Urine culture results showed > 100,000 colonies Klebsiella Ozaenae.  I left a voice message with Maudie Mercury at Dr. Rudene Anda office that I was faxing abnormal urine culture results.  Fax  confirmation received. Defer treatment recommendations to the surgeon.  George [facility] Short Stay Center/Anesthesiology Phone [phone] 05/01/2014 10:48 AM

## 2014-04-28 LAB — URINE CULTURE: Colony Count: >=100000

## 2014-05-01 MED ORDER — CEFAZOLIN SODIUM-DEXTROSE 2-3 GM-% IV SOLR
2.0000 g | INTRAVENOUS | Status: AC
  Administered 2014-05-02: 2 g via INTRAVENOUS
  Filled 2014-05-01: qty 50

## 2014-05-01 NOTE — H&P (Signed)
CHIEF COMPLAINT:  Painful right knee.   HISTORY OF PRESENT ILLNESS:  Kathleen Morales is a very pleasant 80 year old white female who is seen today for evaluation of her right knee.  She has been seen previously with significant pain and discomfort in both knees but more in her right knee.  She has had Euflexxa injections as well as corticosteroid injections, but unfortunately she has started to fail in getting any relief.  She has used Voltaren gel which also does not seem to be working very well.  She has had a left total knee replacement which has done fairly well.  She is now having pain at nighttime as well as pain with every step.  She has tried as much as she can of anti-inflammatories, but they have not been very beneficial. Certainly because of her age she feels it is not wise to continue the anti-inflammatories.  She is now having problems with activities of daily living as well as pain with every step and nighttime pain.  Seen today for evaluation.   PAST SURGICAL HISTORY:  She has had a tubal ligation and a vaginal hysterectomy.   MEDICATIONS:  Balsalazide, Colazal 750 mg 3 tablets 3 times a day; prednisone 5 mg q. a.m., estradiol 0.5 mg daily, vitamin D3 daily, B12 1000 mg daily, Bistin 1000 mg daily.   ALLERGIES:  None known.   FOURTEEN-POINT REVIEW OF SYSTEMS:  Positive for bilateral cataract excision. She has also had ulcerative colitis and has certainly been taken care of by Dr. Lucio Edward.  She is presently on prednisone.  She has had ulcers at age 80.  Otherwise denied.   FAMILY HISTORY:  Positive for a mother who died at 54 from congestive heart failure.  Father who died at age 60 from myocardial infarction.  He had heart disease and hypertension.  She had 7 brothers, 6 who are deceased from congestive heart failure.  One who remains alive at age 79.  She had 6 sisters, and 2 are alive at 51 and 60.  The remaining 4 were deceased from strokes or hip fracture.   SOCIAL HISTORY:  Kathleen Morales is am  80 year old white widowed female, retired.  She denies the use of tobacco or alcohol.   PHYSICAL EXAMINATION:   General:  Today reveals a very pleasant 80 year old white female, well developed, well nourished, alert, pleasant, cooperative in moderate distress secondary to right knee pain.  She is 5 feet 2 inches and weighs 142 pounds.  BMI is 26. Vital Signs:  Temperature 98.  Pulse 80.  Respirations 16.  Blood pressure 128/84. Head:  Normocephalic.  Eyes:  Pupils equal, round, and reactive to light and accommodation with extraocular movements intact. Ears/Nose/Throat:  Benign. Chest:  Had good expansion. Lungs:  Had coarse breath sounds bilaterally. Cardiac:  Had a regular rhythm and rate.  Normal S1, S2.  No murmurs noted. Pulses:  Were 2+, bilateral and symmetric, in the lower extremities. Abdomen:  Scaphoid, soft, nontender.  No mass palpable.  Normal bowel sounds present. Breast/Rectal/Genital:  Not performed and not indicated for an orthopedic evaluation. CNS:  Oriented x3, and cranial nerves II through XII grossly intact. Musculoskeletal:  Today she has range of motion from 3 to 110 degrees.  She has a marked valgus deformity of the knee.  Patellofemoral and tibiofemoral crepitance with range of motion.  Trace to 1+ effusion.  Calf is supple and nontender.  Neurovascularly intact distally.  She is correctable with varus stressing to almost neutral.   CLINICAL  IMPRESSION:   1.  End-stage OA, right knee, with valgus deformity. 2.  Left total knee arthroplasty. 3.  Ulcerative colitis. 4.  Prednisone usage.   RECOMMENDATIONS:  At this time I have reviewed documentation from [facility], and they feel that she is a candidate for surgical intervention.  I reviewed all of their notes here today.  She also has polymyalgia rheumatica for which she does use prednisone, too.  They feel though that she is a good candidate from cardiac and medical to proceed with total knee replacement.   Therefore, the procedure, risks, and benefits were fully explained to the patient, and I explained using appropriate models.  We have gone over the preop and postop courses in detail.  She is understanding.  She would like to proceed with this in the near future.   Mike Craze [place], [place] [phone]  05/01/2014 3:02 PM

## 2014-05-02 ENCOUNTER — Encounter (HOSPITAL_COMMUNITY)
Admission: RE | Disposition: A | Discharge status: Skilled Nursing Facility | Payer: Self-pay | Source: Ambulatory Visit | Attending: Orthopaedic Surgery

## 2014-05-02 ENCOUNTER — Inpatient Hospital Stay (HOSPITAL_COMMUNITY): Payer: Medicare HMO | Admitting: Anesthesiology

## 2014-05-02 ENCOUNTER — Encounter (HOSPITAL_COMMUNITY): Payer: Medicare HMO | Admitting: Vascular Surgery

## 2014-05-02 ENCOUNTER — Encounter (HOSPITAL_COMMUNITY): Payer: Self-pay | Admitting: *Deleted

## 2014-05-02 ENCOUNTER — Inpatient Hospital Stay (HOSPITAL_COMMUNITY)
Admission: RE | Admit: 2014-05-02 | Discharge: 2014-05-04 | DRG: 470 | Disposition: A | Discharge status: Skilled Nursing Facility | Payer: Medicare HMO | Source: Ambulatory Visit | Attending: Orthopaedic Surgery | Admitting: Orthopaedic Surgery

## 2014-05-02 DIAGNOSIS — M1711 Unilateral primary osteoarthritis, right knee: Secondary | ICD-10-CM | POA: Diagnosis present

## 2014-05-02 DIAGNOSIS — M797 Fibromyalgia: Secondary | ICD-10-CM | POA: Diagnosis present

## 2014-05-02 DIAGNOSIS — Z7901 Long term (current) use of anticoagulants: Secondary | ICD-10-CM

## 2014-05-02 DIAGNOSIS — Z7952 Long term (current) use of systemic steroids: Secondary | ICD-10-CM

## 2014-05-02 DIAGNOSIS — Z79899 Other long term (current) drug therapy: Secondary | ICD-10-CM | POA: Diagnosis not present

## 2014-05-02 DIAGNOSIS — M7121 Synovial cyst of popliteal space [Baker], right knee: Secondary | ICD-10-CM | POA: Diagnosis present

## 2014-05-02 DIAGNOSIS — M25561 Pain in right knee: Secondary | ICD-10-CM | POA: Diagnosis present

## 2014-05-02 DIAGNOSIS — Z96652 Presence of left artificial knee joint: Secondary | ICD-10-CM | POA: Diagnosis present

## 2014-05-02 DIAGNOSIS — Z823 Family history of stroke: Secondary | ICD-10-CM | POA: Diagnosis not present

## 2014-05-02 DIAGNOSIS — Z96651 Presence of right artificial knee joint: Secondary | ICD-10-CM

## 2014-05-02 DIAGNOSIS — Z8249 Family history of ischemic heart disease and other diseases of the circulatory system: Secondary | ICD-10-CM

## 2014-05-02 DIAGNOSIS — Z96659 Presence of unspecified artificial knee joint: Secondary | ICD-10-CM

## 2014-05-02 DIAGNOSIS — M353 Polymyalgia rheumatica: Secondary | ICD-10-CM | POA: Diagnosis present

## 2014-05-02 HISTORY — PX: TOTAL KNEE ARTHROPLASTY: SHX125

## 2014-05-02 SURGERY — ARTHROPLASTY, KNEE, TOTAL
Anesthesia: Regional | Site: Knee | Laterality: Right

## 2014-05-02 MED ORDER — FENTANYL CITRATE 0.05 MG/ML IJ SOLN
INTRAMUSCULAR | Status: DC | PRN
  Administered 2014-05-02: 25 ug via INTRAVENOUS
  Administered 2014-05-02: 50 ug via INTRAVENOUS
  Administered 2014-05-02: 25 ug via INTRAVENOUS
  Administered 2014-05-02: 50 ug via INTRAVENOUS
  Administered 2014-05-02 (×2): 25 ug via INTRAVENOUS

## 2014-05-02 MED ORDER — KETOROLAC TROMETHAMINE 15 MG/ML IJ SOLN
INTRAMUSCULAR | Status: AC
  Filled 2014-05-02: qty 1

## 2014-05-02 MED ORDER — DEXTROSE 5 % IV SOLN
500.0000 mg | Freq: Four times a day (QID) | INTRAVENOUS | Status: DC | PRN
  Administered 2014-05-02: 500 mg via INTRAVENOUS
  Filled 2014-05-02: qty 5

## 2014-05-02 MED ORDER — BUPIVACAINE-EPINEPHRINE (PF) 0.5% -1:200000 IJ SOLN
INTRAMUSCULAR | Status: DC | PRN
  Administered 2014-05-02: 25 mL via PERINEURAL

## 2014-05-02 MED ORDER — LIDOCAINE HCL (CARDIAC) 20 MG/ML IV SOLN
INTRAVENOUS | Status: DC | PRN
  Administered 2014-05-02: 50 mg via INTRAVENOUS

## 2014-05-02 MED ORDER — KETOROLAC TROMETHAMINE 15 MG/ML IJ SOLN
15.0000 mg | Freq: Four times a day (QID) | INTRAMUSCULAR | Status: DC
  Administered 2014-05-02: 15 mg via INTRAVENOUS

## 2014-05-02 MED ORDER — HYDRALAZINE HCL 20 MG/ML IJ SOLN
INTRAMUSCULAR | Status: AC
  Filled 2014-05-02: qty 1

## 2014-05-02 MED ORDER — ACETAMINOPHEN 10 MG/ML IV SOLN
1000.0000 mg | Freq: Four times a day (QID) | INTRAVENOUS | Status: DC
  Administered 2014-05-02: 1000 mg via INTRAVENOUS

## 2014-05-02 MED ORDER — PHENOL 1.4 % MT LIQD: 1.0000 | OROMUCOSAL | Status: DC | PRN

## 2014-05-02 MED ORDER — HYDRALAZINE HCL 20 MG/ML IJ SOLN
INTRAMUSCULAR | Status: DC | PRN
  Administered 2014-05-02: 2 mg via INTRAVENOUS

## 2014-05-02 MED ORDER — MIDAZOLAM HCL 5 MG/5ML IJ SOLN
INTRAMUSCULAR | Status: DC | PRN
  Administered 2014-05-02: 0.5 mg via INTRAVENOUS

## 2014-05-02 MED ORDER — RIVAROXABAN 10 MG PO TABS
10.0000 mg | ORAL_TABLET | ORAL | Status: DC
  Administered 2014-05-02 – 2014-05-03 (×2): 10 mg via ORAL
  Filled 2014-05-02 (×3): qty 1

## 2014-05-02 MED ORDER — CHLORHEXIDINE GLUCONATE 4 % EX LIQD
60.0000 mL | Freq: Once | CUTANEOUS | Status: DC
  Filled 2014-05-02: qty 60

## 2014-05-02 MED ORDER — HYDROMORPHONE HCL 1 MG/ML IJ SOLN
INTRAMUSCULAR | Status: AC
  Filled 2014-05-02: qty 1

## 2014-05-02 MED ORDER — ACETAMINOPHEN 10 MG/ML IV SOLN
1000.0000 mg | Freq: Four times a day (QID) | INTRAVENOUS | Status: AC
  Administered 2014-05-02 – 2014-05-03 (×3): 1000 mg via INTRAVENOUS
  Filled 2014-05-02 (×5): qty 100

## 2014-05-02 MED ORDER — METHOCARBAMOL 500 MG PO TABS
500.0000 mg | ORAL_TABLET | Freq: Four times a day (QID) | ORAL | Status: DC | PRN
Start: 2014-05-02 — End: 2014-05-04

## 2014-05-02 MED ORDER — PROPOFOL 10 MG/ML IV BOLUS
INTRAVENOUS | Status: DC | PRN
  Administered 2014-05-02: 150 mg via INTRAVENOUS

## 2014-05-02 MED ORDER — ONDANSETRON HCL 4 MG PO TABS: 4.0000 mg | ORAL_TABLET | Freq: Four times a day (QID) | ORAL | Status: DC | PRN

## 2014-05-02 MED ORDER — PROPOFOL 10 MG/ML IV BOLUS
INTRAVENOUS | Status: AC
  Filled 2014-05-02: qty 20

## 2014-05-02 MED ORDER — ARTIFICIAL TEARS OP OINT
TOPICAL_OINTMENT | OPHTHALMIC | Status: DC | PRN
  Administered 2014-05-02: 1 via OPHTHALMIC

## 2014-05-02 MED ORDER — KETOROLAC TROMETHAMINE 15 MG/ML IJ SOLN
7.5000 mg | Freq: Four times a day (QID) | INTRAMUSCULAR | Status: AC
  Administered 2014-05-02 – 2014-05-03 (×2): 7.5 mg via INTRAVENOUS
  Filled 2014-05-02: qty 1

## 2014-05-02 MED ORDER — ARTIFICIAL TEARS OP OINT
TOPICAL_OINTMENT | OPHTHALMIC | Status: AC
  Filled 2014-05-02: qty 3.5

## 2014-05-02 MED ORDER — BISACODYL 5 MG PO TBEC: 5.0000 mg | DELAYED_RELEASE_TABLET | Freq: Every day | ORAL | Status: DC | PRN

## 2014-05-02 MED ORDER — FENTANYL CITRATE 0.05 MG/ML IJ SOLN
INTRAMUSCULAR | Status: AC
  Filled 2014-05-02: qty 5

## 2014-05-02 MED ORDER — DEXAMETHASONE SODIUM PHOSPHATE 10 MG/ML IJ SOLN
INTRAMUSCULAR | Status: DC | PRN
  Administered 2014-05-02: 10 mg via INTRAVENOUS

## 2014-05-02 MED ORDER — BUPIVACAINE-EPINEPHRINE 0.25% -1:200000 IJ SOLN
INTRAMUSCULAR | Status: DC | PRN
  Administered 2014-05-02: 30 mL

## 2014-05-02 MED ORDER — SODIUM CHLORIDE 0.9 % IV SOLN
75.0000 mL/h | INTRAVENOUS | Status: DC
  Administered 2014-05-02: 75 mL/h via INTRAVENOUS

## 2014-05-02 MED ORDER — MAGNESIUM CITRATE PO SOLN: 1.0000 | Freq: Once | ORAL | Status: AC | PRN

## 2014-05-02 MED ORDER — SENNOSIDES-DOCUSATE SODIUM 8.6-50 MG PO TABS
1.0000 | ORAL_TABLET | Freq: Every evening | ORAL | Status: DC | PRN
Start: 2014-05-02 — End: 2014-05-04

## 2014-05-02 MED ORDER — HYDROMORPHONE HCL 1 MG/ML IJ SOLN: 0.5000 mg | INTRAMUSCULAR | Status: DC | PRN

## 2014-05-02 MED ORDER — SODIUM CHLORIDE 0.9 % IJ SOLN
INTRAMUSCULAR | Status: AC
  Filled 2014-05-02: qty 10

## 2014-05-02 MED ORDER — ONDANSETRON HCL 4 MG/2ML IJ SOLN
4.0000 mg | Freq: Four times a day (QID) | INTRAMUSCULAR | Status: DC | PRN
Start: 2014-05-02 — End: 2014-05-04
  Administered 2014-05-02: 4 mg via INTRAVENOUS
  Filled 2014-05-02: qty 2

## 2014-05-02 MED ORDER — ESTRADIOL 1 MG PO TABS
0.5000 mg | ORAL_TABLET | Freq: Every day | ORAL | Status: DC
  Administered 2014-05-02 – 2014-05-03 (×2): 0.5 mg via ORAL
  Filled 2014-05-02 (×3): qty 0.5

## 2014-05-02 MED ORDER — DEXAMETHASONE SODIUM PHOSPHATE 10 MG/ML IJ SOLN
INTRAMUSCULAR | Status: AC
  Filled 2014-05-02: qty 1

## 2014-05-02 MED ORDER — PHENYLEPHRINE HCL 10 MG/ML IJ SOLN
INTRAMUSCULAR | Status: DC | PRN
  Administered 2014-05-02: 40 ug via INTRAVENOUS
  Administered 2014-05-02: 20 ug via INTRAVENOUS

## 2014-05-02 MED ORDER — LIDOCAINE HCL (CARDIAC) 20 MG/ML IV SOLN
INTRAVENOUS | Status: AC
  Filled 2014-05-02: qty 5

## 2014-05-02 MED ORDER — HYDROMORPHONE HCL 1 MG/ML IJ SOLN
0.2500 mg | INTRAMUSCULAR | Status: DC | PRN
  Administered 2014-05-02: 0.5 mg via INTRAVENOUS
  Administered 2014-05-02: 0.25 mg via INTRAVENOUS
  Administered 2014-05-02: 0.5 mg via INTRAVENOUS
  Administered 2014-05-02: 0.25 mg via INTRAVENOUS

## 2014-05-02 MED ORDER — SODIUM CHLORIDE 0.9 % IR SOLN
Status: DC | PRN
  Administered 2014-05-02 (×2): 1000 mL

## 2014-05-02 MED ORDER — LACTATED RINGERS IV SOLN
INTRAVENOUS | Status: DC | PRN
  Administered 2014-05-02 (×2): via INTRAVENOUS

## 2014-05-02 MED ORDER — CEFAZOLIN SODIUM-DEXTROSE 2-3 GM-% IV SOLR
2.0000 g | Freq: Four times a day (QID) | INTRAVENOUS | Status: AC
  Administered 2014-05-02 (×2): 2 g via INTRAVENOUS
  Filled 2014-05-02 (×3): qty 50

## 2014-05-02 MED ORDER — MENTHOL 3 MG MT LOZG
1.0000 | LOZENGE | OROMUCOSAL | Status: DC | PRN
Start: 2014-05-02 — End: 2014-05-04

## 2014-05-02 MED ORDER — OXYCODONE HCL 5 MG PO TABS
5.0000 mg | ORAL_TABLET | ORAL | Status: DC | PRN
  Administered 2014-05-02: 5 mg via ORAL
  Administered 2014-05-03: 10 mg via ORAL
  Filled 2014-05-02 (×2): qty 2

## 2014-05-02 MED ORDER — ONDANSETRON HCL 4 MG/2ML IJ SOLN
INTRAMUSCULAR | Status: DC | PRN
  Administered 2014-05-02: 4 mg via INTRAVENOUS

## 2014-05-02 MED ORDER — DOCUSATE SODIUM 100 MG PO CAPS
100.0000 mg | ORAL_CAPSULE | Freq: Two times a day (BID) | ORAL | Status: DC
  Administered 2014-05-03 – 2014-05-04 (×3): 100 mg via ORAL
  Filled 2014-05-02 (×4): qty 1

## 2014-05-02 MED ORDER — METOCLOPRAMIDE HCL 10 MG PO TABS: 5.0000 mg | ORAL_TABLET | Freq: Three times a day (TID) | ORAL | Status: DC | PRN

## 2014-05-02 MED ORDER — DEXTROSE 5 % IV SOLN
INTRAVENOUS | Status: DC | PRN
  Administered 2014-05-02: 08:00:00 via INTRAVENOUS

## 2014-05-02 MED ORDER — ACETAMINOPHEN 10 MG/ML IV SOLN
1000.0000 mg | INTRAVENOUS | Status: DC
  Filled 2014-05-02: qty 100

## 2014-05-02 MED ORDER — SODIUM CHLORIDE 0.9 % IV SOLN: INTRAVENOUS | Status: DC

## 2014-05-02 MED ORDER — METOCLOPRAMIDE HCL 5 MG/ML IJ SOLN
5.0000 mg | Freq: Three times a day (TID) | INTRAMUSCULAR | Status: DC | PRN
  Administered 2014-05-02 – 2014-05-03 (×2): 10 mg via INTRAVENOUS
  Filled 2014-05-02 (×2): qty 2

## 2014-05-02 MED ORDER — ONDANSETRON HCL 4 MG/2ML IJ SOLN
INTRAMUSCULAR | Status: AC
Start: 2014-05-02 — End: 2014-05-02
  Filled 2014-05-02: qty 2

## 2014-05-02 MED ORDER — ALUM & MAG HYDROXIDE-SIMETH 200-200-20 MG/5ML PO SUSP
30.0000 mL | ORAL | Status: DC | PRN
Start: 2014-05-02 — End: 2014-05-04

## 2014-05-02 MED ORDER — BUPIVACAINE-EPINEPHRINE (PF) 0.25% -1:200000 IJ SOLN
INTRAMUSCULAR | Status: AC
  Filled 2014-05-02: qty 30

## 2014-05-02 MED ORDER — MIDAZOLAM HCL 2 MG/2ML IJ SOLN
INTRAMUSCULAR | Status: AC
  Filled 2014-05-02: qty 2

## 2014-05-02 MED ORDER — PREDNISONE 5 MG PO TABS
5.0000 mg | ORAL_TABLET | Freq: Every day | ORAL | Status: DC
Start: 2014-05-03 — End: 2014-05-04
  Administered 2014-05-03 – 2014-05-04 (×2): 5 mg via ORAL
  Filled 2014-05-02 (×4): qty 1

## 2014-05-02 MED ORDER — BALSALAZIDE DISODIUM 750 MG PO CAPS
2250.0000 mg | ORAL_CAPSULE | Freq: Three times a day (TID) | ORAL | Status: DC
  Administered 2014-05-02 – 2014-05-04 (×5): 2250 mg via ORAL
  Filled 2014-05-02 (×8): qty 3

## 2014-05-02 SURGICAL SUPPLY — 61 items
BANDAGE ESMARK 6X9 LF (GAUZE/BANDAGES/DRESSINGS) ×2 IMPLANT
BLADE SAGITTAL 25.0X1.19X90 (BLADE) ×2 IMPLANT
BLADE SAGITTAL 25.0X1.19X90MM (BLADE) ×1
BNDG ESMARK 6X9 LF (GAUZE/BANDAGES/DRESSINGS) ×6
BOWL SMART MIX CTS (DISPOSABLE) ×3 IMPLANT
CAPT RP KNEE ×3 IMPLANT
CEMENT HV SMART SET (Cement) ×6 IMPLANT
COVER SURGICAL LIGHT HANDLE (MISCELLANEOUS) ×3 IMPLANT
CUFF TOURNIQUET SINGLE 34IN LL (TOURNIQUET CUFF) ×3 IMPLANT
DRAPE EXTREMITY T 121X128X90 (DRAPE) ×3 IMPLANT
DRAPE PROXIMA HALF (DRAPES) ×3 IMPLANT
DRSG ADAPTIC 3X8 NADH LF (GAUZE/BANDAGES/DRESSINGS) ×3 IMPLANT
DRSG PAD ABDOMINAL 8X10 ST (GAUZE/BANDAGES/DRESSINGS) ×6 IMPLANT
DURAPREP 26ML APPLICATOR (WOUND CARE) ×6 IMPLANT
ELECT CAUTERY BLADE 6.4 (BLADE) ×3 IMPLANT
ELECT REM PT RETURN 9FT ADLT (ELECTROSURGICAL) ×3
ELECTRODE REM PT RTRN 9FT ADLT (ELECTROSURGICAL) ×1 IMPLANT
EVACUATOR 1/8 PVC DRAIN (DRAIN) IMPLANT
FACESHIELD WRAPAROUND (MASK) ×6 IMPLANT
GAUZE SPONGE 4X4 12PLY STRL (GAUZE/BANDAGES/DRESSINGS) ×3 IMPLANT
GLOVE BIOGEL PI IND STRL 6.5 (GLOVE) ×1 IMPLANT
GLOVE BIOGEL PI IND STRL 8 (GLOVE) ×1 IMPLANT
GLOVE BIOGEL PI IND STRL 8.5 (GLOVE) ×1 IMPLANT
GLOVE BIOGEL PI INDICATOR 6.5 (GLOVE) ×2
GLOVE BIOGEL PI INDICATOR 8 (GLOVE) ×2
GLOVE BIOGEL PI INDICATOR 8.5 (GLOVE) ×2
GLOVE ECLIPSE 6.5 STRL STRAW (GLOVE) ×3 IMPLANT
GLOVE ECLIPSE 8.0 STRL XLNG CF (GLOVE) ×6 IMPLANT
GLOVE SURG ORTHO 8.5 STRL (GLOVE) ×6 IMPLANT
GOWN STRL REUS W/ TWL LRG LVL3 (GOWN DISPOSABLE) ×2 IMPLANT
GOWN STRL REUS W/TWL 2XL LVL3 (GOWN DISPOSABLE) ×3 IMPLANT
GOWN STRL REUS W/TWL LRG LVL3 (GOWN DISPOSABLE) ×4
HANDPIECE INTERPULSE COAX TIP (DISPOSABLE) ×2
KIT BASIN OR (CUSTOM PROCEDURE TRAY) ×3 IMPLANT
KIT ROOM TURNOVER OR (KITS) ×3 IMPLANT
MANIFOLD NEPTUNE II (INSTRUMENTS) ×3 IMPLANT
NEEDLE 22X1 1/2 (OR ONLY) (NEEDLE) IMPLANT
NS IRRIG 1000ML POUR BTL (IV SOLUTION) ×3 IMPLANT
PACK TOTAL JOINT (CUSTOM PROCEDURE TRAY) ×3 IMPLANT
PAD ARMBOARD 7.5X6 YLW CONV (MISCELLANEOUS) ×6 IMPLANT
PAD CAST 4YDX4 CTTN HI CHSV (CAST SUPPLIES) ×1 IMPLANT
PADDING CAST COTTON 4X4 STRL (CAST SUPPLIES) ×2
PADDING CAST COTTON 6X4 STRL (CAST SUPPLIES) ×3 IMPLANT
SET HNDPC FAN SPRY TIP SCT (DISPOSABLE) ×1 IMPLANT
SPONGE GAUZE 4X4 12PLY STER LF (GAUZE/BANDAGES/DRESSINGS) ×3 IMPLANT
STAPLER VISISTAT 35W (STAPLE) ×3 IMPLANT
SUCTION FRAZIER TIP 10 FR DISP (SUCTIONS) ×3 IMPLANT
SUT BONE WAX W31G (SUTURE) ×3 IMPLANT
SUT ETHIBOND NAB CT1 #1 30IN (SUTURE) ×9 IMPLANT
SUT MNCRL AB 3-0 PS2 18 (SUTURE) ×3 IMPLANT
SUT VIC AB 0 CT1 27 (SUTURE) ×2
SUT VIC AB 0 CT1 27XBRD ANBCTR (SUTURE) ×1 IMPLANT
SUT VIC AB 1 CT1 27 (SUTURE) ×2
SUT VIC AB 1 CT1 27XBRD ANBCTR (SUTURE) ×1 IMPLANT
SYR CONTROL 10ML LL (SYRINGE) IMPLANT
TOWEL OR 17X24 6PK STRL BLUE (TOWEL DISPOSABLE) ×3 IMPLANT
TOWEL OR 17X26 10 PK STRL BLUE (TOWEL DISPOSABLE) ×3 IMPLANT
TRAY FOLEY CATH 14FR (SET/KITS/TRAYS/PACK) ×3 IMPLANT
TRAY FOLEY CATH 16FRSI W/METER (SET/KITS/TRAYS/PACK) IMPLANT
WATER STERILE IRR 1000ML POUR (IV SOLUTION) ×6 IMPLANT
WRAP KNEE MAXI GEL POST OP (GAUZE/BANDAGES/DRESSINGS) ×3 IMPLANT

## 2014-05-02 NOTE — Op Note (Signed)
NAMESHALANDRA, Kathleen Morales NO.:  [id number]  MEDICAL RECORD NO.:  [id number]  LOCATION:  [facility]                         FACILITY:  [facility]  PHYSICIAN:  Vonna Kotyk. Erika Hussar, M.D.DATE OF BIRTH:  1929-02-20  DATE OF PROCEDURE:  05/02/2014 DATE OF DISCHARGE:                              OPERATIVE REPORT   PREOPERATIVE DIAGNOSIS:  End-stage osteoarthritis, right knee.  POSTOPERATIVE DIAGNOSIS:  End-stage osteoarthritis, right knee.  PROCEDURE:  Right total knee replacement.  SURGEON:  Vonna Kotyk. Durward Fortes, MD  ASSISTANT:  Biagio Borg, PA-C, who was present throughout the operative procedure to ensure its time with completion.  ANESTHESIA:  General supplemental femoral nerve block.  COMPLICATIONS:  None.  COMPONENTS:  DePuy LCS standard femoral component, a #2.5 rotating keeled tibial tray 10-mm polyethylene bridging bearing and metal-backed 3-peg rotating patella.  Components were secured with polymethyl methacrylate.  DESCRIPTION OF PROCEDURE:  Kathleen Morales  was met in the holding area, identified the right knee as appropriate operative site and marked it accordingly.  She did receive a preoperative femoral nerve block per Anesthesia.  The patient was then transported to room #7 and placed under general anesthesia without difficulty.  Nursing staff inserted a Foley catheter. A urine culture was obtained.  There was a question as to whether or not she had a Klebsiella urinary tract infection preoperatively, she was afebrile and asymptomatic.  A 2 g of Ancef was administered.  A time-out was called.  Tourniquet was applied to the right thigh and the right leg was then prepped with chlorhexidine scrub and DuraPrep from the tourniquet to the tips of the toes.  Sterile draping was performed.  The leg was Esmarch exsanguinated with a proximal tourniquet at 350 mmHg.  A midline longitudinal incision was made with a 15 blade knife and carried down to the  subcutaneous tissue.  First layer of capsule was incised in the midline.  A medial parapatellar incision was made with the Bovie to the deep capsule.  Joint was entered.  There was a clear yellow joint effusion.  Patella was everted 180 degrees and knee flexed to 90 degrees.  There were moderate osteophytes along the medial and lateral femoral condyles and near complete absence of articular cartilage in the lateral compartment with a valgus deformity with weight bearing.  I could correct the knee to neutral.  Osteophytes were removed.  Synovectomy was performed.  I then measured the standard femoral component.  We noted that there was some more bleeding than usual with a tourniquet at that point, so we deflated the tourniquet at 11 minutes, waited approximately 7 minutes, and then re-elevated the tourniquet after Esmarch exsanguinated at 350 mmHg.  At that point, we made our initial bony cut transversely in the proximal tibia with the external tibial guide with a 7-degree angle of declination.  With each bony cut in the tibia and subsequently over the femur, we checked our alignment with the external guide.  Cuts on the femur were made with the standard femoral jig, used a 3- degree distal femoral valgus cut.  Laminar spreaders were inserted along the medial and lateral compartment so that I could visualize each compartment and perform a medial and  lateral meniscectomy as well as excise the ACL and PCL.  Osteophytes removed from the posterior femoral condyle with a 3/4-inch curved osteotome.  There was a large Baker cyst noted posteromedially, the visualized wall was excised.  Final cut was then made on the femur for tapering purposes.  MCL and LCL remained intact throughout the procedure.  Retractors were then placed around the tibia, was advanced anteriorly, we measured a 2.5 tibial tray, this was pinned in place.  We checked our alignment with the external guide.  A center hole was  made followed by the keeled cut.  With the tibial tray in place, a 10-mm polyethylene bridging bearing was inserted as our flexion and extension gaps were symmetrical at 10 mm followed by the standard femoral component.  The knee was reduced, and through a full range of motion it was perfectly stable,  we had re-established normal alignment.  There was no abnormal motion of the tibia or the femur.  Retractors were then placed around the patella.  We did measure approximately 21 mm in width.  I removed approximately 8 mm and measured again with a caliper.  The patella jig was then applied and 3 holes made.  The trial patella inserted.  The knee reduced and through a full range of motion all components were stable.  The trial components were then removed.  The joint was copiously irrigated with saline solution.  The final components were then inserted with polymethyl methacrylate. We initially inserted the tibial tray followed by the polyethylene bridging bearing and the standard femoral component.  These were impacted in place.  The knee placed in extension.  We then removed any extraneous methacrylate from the periphery of the components.  Patella was applied with a clamp and any extraneous methacrylate was removed from its periphery.  At approximately 16 minutes the methacrylate had hardened and matured during which time we injected the deep capsule with 0.25% Marcaine with epinephrine.  Tourniquet was deflated at approximately 16 minutes with a second tourniquet.  The joint was irrigated with saline solution.  Gross bleeders were Bovie coagulated.  Bleeding bone was covered with bone wax.  The deep capsule was closed with #1 Ethibond, superficial capsule with 0 Vicryl, subcu with 2-0 Vicryl and 3-0 Monocryl.  We did insert a Hemovac in the lateral capsule.  Skin closed with skin clips.  Sterile bulky dressing was applied followed by the patient's support stocking.  The  patient tolerated the procedure without complications.     Vonna Kotyk. Durward Fortes, M.D.     PWW/MEDQ  D:  05/02/2014  T:  05/02/2014  Job:  [id number]

## 2014-05-02 NOTE — Progress Notes (Signed)
Utilization review completed.  

## 2014-05-02 NOTE — Op Note (Signed)
PATIENT ID:      Kathleen Morales  MRN:     [id number] DOB/AGE:    [birth year] / 80 y.o.       OPERATIVE REPORT    DATE OF PROCEDURE:  05/02/2014       PREOPERATIVE DIAGNOSIS:   RIGHT KNEE OSTEOARTHRITIS-END STAGE                                                       Estimated body mass index is 25.78 kg/(m^2) as calculated from the following:   Height as of this encounter: 5' 2"  (1.575 m).   Weight as of this encounter: 63.957 kg (141 lb).     POSTOPERATIVE DIAGNOSIS:   RIGHT KNEE OSTEOARTHRITIS-END STAGE                                                                     Estimated body mass index is 25.78 kg/(m^2) as calculated from the following:   Height as of this encounter: 5' 2"  (1.575 m).   Weight as of this encounter: 63.957 kg (141 lb).     PROCEDURE:  Procedure(s): TOTAL KNEE ARTHROPLASTY right     SURGEON:  [name], MD    ASSISTANT:   Biagio Borg, PA-C   (Present and scrubbed throughout the case, critical for assistance with exposure, retraction, instrumentation, and closure.)          ANESTHESIA: regional and general     DRAINS: (right knee) Hemovact drain(s) in the clamped with  Suction Clamped :      TOURNIQUET TIME: * Missing tourniquet times found for documented tourniquets in log:  [id number] * Total Tourniquet Time Documented: Thigh (Right) - 11 minutes Total: Thigh (Right) - 11 minutes  second tourniquet 60 min   COMPLICATIONS:  None   CONDITION:  stable  PROCEDURE IN [id number]   Kathleen Morales, Kathleen Morales 05/02/2014, 9:14 AM

## 2014-05-02 NOTE — Progress Notes (Signed)
Orthopedic Tech Progress Note Patient Details:  Kathleen Morales [birth year] [id number]  CPM Right Knee CPM Right Knee: On Right Knee Flexion (Degrees): 90 Right Knee Extension (Degrees): 0 Additional Comments: Trapeze bar and foot roll   Cammer, Theodoro Parma 05/02/2014, 10:06 AM

## 2014-05-02 NOTE — H&P (Signed)
  The recent History & Physical has been reviewed. I have personally examined the patient today. There is no interval change to the documented History & Physical. The patient would like to proceed with the procedure.  [name] W 05/02/2014,  7:12 AM

## 2014-05-02 NOTE — Anesthesia Preprocedure Evaluation (Addendum)
Anesthesia Evaluation  Patient identified by MRN, date of birth, ID band Patient awake    Reviewed: Allergy & Precautions, H&P , NPO status , Patient's Chart, lab work & pertinent test results, reviewed documented beta blocker date and time   Airway Mallampati: I TM Distance: >3 FB Neck ROM: Full    Dental  (+) Teeth Intact, Dental Advisory Given   Pulmonary          Cardiovascular Rhythm:Regular Rate:Normal     Neuro/Psych    GI/Hepatic PUD,   Endo/Other  On daily pred for arthritis; omitted this a.m.  Renal/GU      Musculoskeletal  (+) Arthritis -, Fibromyalgia -  Abdominal   Peds  Hematology   Anesthesia Other Findings   Reproductive/Obstetrics                         Anesthesia Physical Anesthesia Plan  ASA: I  Anesthesia Plan: General and Regional   Post-op Pain Management:    Induction: Intravenous  Airway Management Planned: LMA  Additional Equipment:   Intra-op Plan:   Post-operative Plan: Extubation in OR  Informed Consent: I have reviewed the patients History and Physical, chart, labs and discussed the procedure including the risks, benefits and alternatives for the proposed anesthesia with the patient or authorized representative who has indicated his/her understanding and acceptance.   Dental advisory given  Plan Discussed with: CRNA, Anesthesiologist and Surgeon  Anesthesia Plan Comments:        Anesthesia Quick Evaluation

## 2014-05-02 NOTE — Anesthesia Postprocedure Evaluation (Signed)
  Anesthesia Post-op Note  Patient: Kathleen Morales  Procedure(s) Performed: Procedure(s): TOTAL KNEE ARTHROPLASTY (Right)  Patient Location: PACU  Anesthesia Type:General and block  Level of Consciousness: awake and alert   Airway and Oxygen Therapy: Patient Spontanous Breathing  Post-op Pain: mild  Post-op Assessment: Post-op Vital signs reviewed, Patient's Cardiovascular Status Stable and Respiratory Function Stable  Post-op Vital Signs: Reviewed  Filed Vitals:   05/02/14 1045  BP: 156/72  Pulse: 68  Temp: 36.4 C  Resp: 9    Complications: No apparent anesthesia complications

## 2014-05-02 NOTE — Evaluation (Signed)
Physical Therapy Evaluation Patient Details Name: Kathleen Morales MRN: [id number] DOB: 02/22/1929 Today's Date: 05/02/2014   History of Present Illness  80 y.o. female admitted to [facility] on 05/02/14 for elective R TKA.  Pt with significant PMHx of L TKA (about 2 years ago), fibromyalgia, back pain (has had injections and wears a brace-although none present in hospital), and is [facility] (wears hearing aids).  Clinical Impression  Pt is POD #0 and is moving well with RW min assist, however, is limited by post-op nausea and vomiting.  RN working on getting this under control.  Pt has pre arranged SNF for rehab at discharge.   PT to follow acutely for deficits listed below.       Follow Up Recommendations SNF (pre arranged for [facility] in [place])    Equipment Recommendations  None recommended by PT    Recommendations for Other Services   NA     Precautions / Restrictions Precautions Precautions: Knee Precaution Booklet Issued: Yes (comment) Precaution Comments: knee handout give, PWB 50% status reviewed.  Required Braces or Orthoses:  (none ordered, none in room) Restrictions Weight Bearing Restrictions: Yes RLE Weight Bearing: Partial weight bearing RLE Partial Weight Bearing Percentage or Pounds: 50%      Mobility  Bed Mobility Overal bed mobility: Needs Assistance Bed Mobility: Supine to Sit     Supine to sit: Min assist     General bed mobility comments: Min assist to progress her right leg to EOB.  Verbal cues for hand placement and sequencing.   Transfers Overall transfer level: Needs assistance Equipment used: Rolling walker (2 wheeled) Transfers: Sit to/from Stand Sit to Stand: Min assist         General transfer comment: Min assist to support trunk to get to standing.  Verbal cues for 50% WB on right leg.   Ambulation/Gait Ambulation/Gait assistance: Min assist Ambulation Distance (Feet): 8 Feet Assistive device: Rolling walker (2 wheeled) Gait  Pattern/deviations: Step-to pattern;Antalgic Gait velocity: decreased Gait velocity interpretation: Below normal speed for age/gender General Gait Details: Verbal cues for leg sequencing, breathing during mobility, and safe RW use.  Pt became nauseated and vomitted while attempting to walk to the recliner chair.  RN in room.           Balance Overall balance assessment: Needs assistance Sitting-balance support: Feet supported;No upper extremity supported Sitting balance-Leahy Scale: Good     Standing balance support: Bilateral upper extremity supported Standing balance-Leahy Scale: Poor Standing balance comment: needs external support from therapist and RW in standing.                              Pertinent Vitals/Pain Pain Assessment: Faces Faces Pain Scale: Hurts little more Pain Location: right knee Pain Descriptors / Indicators: Aching;Burning Pain Intervention(s): Limited activity within patient's tolerance;Monitored during session;Repositioned;Ice applied    Home Living Family/patient expects to be discharged to:: Skilled nursing facility ([facility] in [place] Pre arranged per pt. ) Living Arrangements: Spouse/significant other               Additional Comments: Lives with husband in one story home with 2 STE and L railing.  She has a RW from previous surgery.     Prior Function Level of Independence: Independent         Comments: Per husband she should have used a cane, but did not.         Extremity/Trunk Assessment  Upper Extremity Assessment: Defer to OT evaluation           Lower Extremity Assessment: RLE deficits/detail RLE Deficits / Details: right leg with normal post op pain and weakness.  Pt with 3/5 ankle, 2+/5 knee, 2+/5 hip flexion.      Cervical / Trunk Assessment: Other exceptions  Communication   Communication: HOH (wears hearing aids)  Cognition Arousal/Alertness: Awake/alert Behavior During Therapy: WFL  for tasks assessed/performed Overall Cognitive Status: Within Functional Limits for tasks assessed                      General Comments General comments (skin integrity, edema, etc.): O2 sats stable on RA and pt on continuous pulse ox, so left O2 off after mobility.           Assessment/Plan    PT Assessment Patient needs continued PT services  PT Diagnosis Difficulty walking;Abnormality of gait;Acute pain;Generalized weakness   PT Problem List Decreased strength;Decreased range of motion;Decreased activity tolerance;Decreased balance;Decreased mobility;Decreased knowledge of use of DME;Decreased knowledge of precautions;Pain  PT Treatment Interventions DME instruction;Stair training;Gait training;Functional mobility training;Therapeutic activities;Therapeutic exercise;Balance training;Neuromuscular re-education;Patient/family education;Modalities;Manual techniques   PT Goals (Current goals can be found in the Care Plan section) Acute Rehab PT Goals Patient Stated Goal: to get better and go home soon after rehab PT Goal Formulation: With patient Time For Goal Achievement: 05/09/14 Potential to Achieve Goals: Good    Frequency 7X/week    End of Session   Activity Tolerance: Other (comment) (limited by N/V) Patient left: in chair;with call bell/phone within reach;with family/visitor present;with nursing/sitter in room Nurse Communication: Mobility status         Time: 3354-5625 PT Time Calculation (min): 24 min   Charges:   PT Evaluation $Initial PT Evaluation Tier I: 1 Procedure PT Treatments $Therapeutic Activity: 8-22 mins        Tangi Shroff B. Creighton, [place], DPT [phone]   05/02/2014, 5:41 PM

## 2014-05-02 NOTE — Anesthesia Procedure Notes (Addendum)
Anesthesia Regional Block:  Femoral nerve block  Pre-Anesthetic Checklist: ,, timeout performed, Correct Patient, Correct Site, Correct Laterality, Correct Procedure, Correct Position, site marked, Risks and benefits discussed,  Surgical consent,  Pre-op evaluation,  At surgeon's request and post-op pain management  Laterality: Lower and Right  Prep: chloraprep       Needles:  Injection technique: Single-shot  Needle Type: Echogenic Needle     Needle Length: 9cm 9 cm Needle Gauge: 21 and 21 G    Additional Needles:  Procedures: ultrasound guided (picture in chart) Femoral nerve block Narrative:  Start time: 05/02/2014 6:43 AM End time: 05/02/2014 6:50 AM Injection made incrementally with aspirations every 5 mL.  Performed by: Personally  Anesthesiologist: Lorrene Reid, MD   Procedure Name: LMA Insertion Date/Time: 05/02/2014 7:33 AM Performed by: Terrill Mohr Pre-anesthesia Checklist: Patient identified, Emergency Drugs available, Suction available and Patient being monitored Patient Re-evaluated:Patient Re-evaluated prior to inductionOxygen Delivery Method: Circle system utilized Preoxygenation: Pre-oxygenation with 100% oxygen Intubation Type: IV induction Ventilation: Mask ventilation without difficulty LMA: LMA inserted LMA Size: 4.0 Number of attempts: 1 Placement Confirmation: breath sounds checked- equal and bilateral and positive ETCO2 Tube secured with: Tape (taped across cheeks; gauze roll b/t teeth) Dental Injury: Teeth and Oropharynx as per pre-operative assessment

## 2014-05-02 NOTE — Transfer of Care (Signed)
Immediate Anesthesia Transfer of Care Note  Patient: Kathleen Morales  Procedure(s) Performed: Procedure(s): TOTAL KNEE ARTHROPLASTY (Right)  Patient Location: PACU  Anesthesia Type:General  Level of Consciousness: awake, sedated and patient cooperative  Airway & Oxygen Therapy: Patient Spontanous Breathing and Patient connected to nasal cannula oxygen  Post-op Assessment: Report given to PACU RN, Post -op Vital signs reviewed and stable and Patient moving all extremities  Post vital signs: Reviewed and stable  Complications: No apparent anesthesia complications

## 2014-05-03 LAB — CBC
HCT: 34.1 % — ABNORMAL LOW (ref 36.0–46.0)
Hemoglobin: 11.6 g/dL — ABNORMAL LOW (ref 12.0–15.0)
MCH: 33 pg (ref 26.0–34.0)
MCHC: 34 g/dL (ref 30.0–36.0)
MCV: 97.2 fL (ref 78.0–100.0)
Platelets: 279 10*3/uL (ref 150–400)
RBC: 3.51 MIL/uL — ABNORMAL LOW (ref 3.87–5.11)
RDW: 13.4 % (ref 11.5–15.5)
WBC: 18 10*3/uL — ABNORMAL HIGH (ref 4.0–10.5)

## 2014-05-03 LAB — BASIC METABOLIC PANEL
Anion gap: 11 (ref 5–15)
BUN: 14 mg/dL (ref 6–23)
CHLORIDE: 100 meq/L (ref 96–112)
CO2: 25 mEq/L (ref 19–32)
Calcium: 8.3 mg/dL — ABNORMAL LOW (ref 8.4–10.5)
Creatinine, Ser: 0.73 mg/dL (ref 0.50–1.10)
GFR, EST AFRICAN AMERICAN: 89 mL/min — AB (ref >90)
GFR, EST NON AFRICAN AMERICAN: 77 mL/min — AB (ref >90)
Glucose, Bld: 133 mg/dL — ABNORMAL HIGH (ref 70–99)
Potassium: 3.8 mEq/L (ref 3.7–5.3)
SODIUM: 136 meq/L — AB (ref 137–147)

## 2014-05-03 NOTE — Progress Notes (Signed)
Physical Therapy Treatment Patient Details Name: Kathleen Morales MRN: [id number] DOB: [birth year] Today's Date: 05/03/2014    History of Present Illness 80 y.o. female admitted to [facility] on 05/02/14 for elective R TKA.  Pt with significant PMHx of L TKA (about 2 years ago), fibromyalgia, back pain (has had injections and wears a brace-although none present in hospital), and is [facility] (wears hearing aids).    PT Comments    Patient progressing with ambulation. Became hot and flused with walking therefore returned to recliner for therex. Will continue with current POC  Follow Up Recommendations  SNF (pre-registered at [facility])     Equipment Recommendations  None recommended by PT    Recommendations for Other Services       Precautions / Restrictions Precautions Precautions: Knee Restrictions RLE Weight Bearing: Partial weight bearing RLE Partial Weight Bearing Percentage or Pounds: 50%    Mobility  Bed Mobility Overal bed mobility: Needs Assistance Bed Mobility: Supine to Sit     Supine to sit: Min guard     General bed mobility comments: Cues for positioning  Transfers Overall transfer level: Needs assistance Equipment used: Rolling walker (2 wheeled) Transfers: Sit to/from Stand Sit to Stand: Min assist         General transfer comment: Min assist to support trunk to get to standing.  Verbal cues for safe hand placement  Ambulation/Gait Ambulation/Gait assistance: Min assist Ambulation Distance (Feet): 40 Feet Assistive device: Rolling walker (2 wheeled) Gait Pattern/deviations: Step-to pattern;Decreased step length - left;Decreased stance time - right Gait velocity: decreased   General Gait Details: Verbal cues for leg sequencingand safe RW use.  A to ensure safety    Stairs            Wheelchair Mobility    Modified Rankin (Stroke Patients Only)       Balance                                    Cognition Arousal/Alertness:  Awake/alert Behavior During Therapy: WFL for tasks assessed/performed Overall Cognitive Status: Within Functional Limits for tasks assessed                      Exercises Total Joint Exercises Quad Sets: AROM;Right;10 reps Heel Slides: AAROM;Right;10 reps Hip ABduction/ADduction: AAROM;Right;10 reps Straight Leg Raises: AAROM;Right;10 reps    General Comments        Pertinent Vitals/Pain Pain Assessment: 0-10 Pain Score: 6  Pain Location: R KNEE Pain Descriptors / Indicators: Sore Pain Intervention(s): Limited activity within patient's tolerance;Monitored during session    Home Living                      Prior Function            PT Goals (current goals can now be found in the care plan section) Progress towards PT goals: Progressing toward goals    Frequency  7X/week    PT Plan Current plan remains appropriate    Co-evaluation             End of Session Equipment Utilized During Treatment: Gait belt Activity Tolerance: Patient tolerated treatment well Patient left: in chair;with call bell/phone within reach;with family/visitor present     Time: 4097-3532 PT Time Calculation (min): 26 min  Charges:  $Gait Training: 8-22 mins $Therapeutic Exercise: 8-22 mins  G Codes:      Jacqualyn Posey 05/03/2014, 10:02 AM  05/03/2014 Jacqualyn Posey PTA [phone] pager [phone] office

## 2014-05-03 NOTE — Progress Notes (Signed)
PATIENT ID: Kathleen Morales        MRN:  [id number]          DOB/AGE: June 16, 1929 / 80 y.o.    Kathleen Fears, MD   Kathleen Borg, PA-C [address] [place], [place]  [address]                             [phone]   PROGRESS NOTE  Subjective:  negative for Chest Pain  negative for Shortness of Breath  negative for Nausea/Vomiting   negative for Calf Pain    Tolerating Diet: yes         Patient reports pain as mild and moderate.     Nauseated yesterday.  None today  Objective: Vital signs in last 24 hours:   Patient Vitals for the past 24 hrs:  BP Temp Temp src Pulse Resp SpO2  05/03/14 0625 115/52 mmHg 98.6 F (37 C) Oral 68 17 95 %  05/03/14 0400 - - - - 16 95 %  05/03/14 0037 117/70 mmHg 98.1 F (36.7 C) Oral 64 16 95 %  05/03/14 0000 - - - - 16 95 %  05/02/14 2043 132/64 mmHg 98.2 F (36.8 C) Oral 62 15 95 %  05/02/14 2000 - - - - 16 95 %  05/02/14 1045 156/72 mmHg 97.6 F (36.4 C) - 68 14 97 %  05/02/14 1030 170/69 mmHg - - 65 16 98 %  05/02/14 1015 154/68 mmHg - - 61 9 99 %  05/02/14 1000 169/77 mmHg - - 62 16 98 %  05/02/14 0941 158/73 mmHg 97.5 F (36.4 C) - 62 17 97 %      Intake/Output from previous day:   10/06 0701 - 10/07 0700 In: 1610 [P.O.:60; I.V.:1400] Out: 3445 [Urine:2875; Drains:520]   Intake/Output this shift:       Intake/Output     10/06 0701 - 10/07 0700 10/07 0701 - 10/08 0700   P.O. 60    I.V. (mL/kg) 1400 (21.9)    IV Piggyback 150    Total Intake(mL/kg) 1610 (25.2)    Urine (mL/kg/hr) 2875 (1.9)    Drains 520 (0.3)    Blood 50 (0)    Total Output 3445     Net -1835             LABORATORY DATA:  Recent Labs  04/26/14 1422  WBC 9.5  HGB 14.7  HCT 44.3  PLT 301    Recent Labs  04/26/14 1422  NA 140  K 4.0  CL 101  CO2 27  BUN 14  CREATININE 0.69  GLUCOSE 121*  CALCIUM 9.9   Lab Results  Component Value Date   INR 0.99 04/26/2014   INR 1.03 04/01/2012    Recent Radiographic Studies :  Chest 2  View  04/26/2014   CLINICAL DATA:  Preoperative films.  EXAM: CHEST  2 VIEW  COMPARISON:  PA and lateral chest 03/24/2013.  FINDINGS: Lungs are clear. Heart size is normal. No pneumothorax or pleural effusion. Convex left scoliosis noted.  IMPRESSION: No acute disease.   Electronically Signed   By: Inge Rise M.D.   On: 04/26/2014 14:46     Examination:  General appearance: alert, cooperative, mild distress and moderate distress Resp: clear to auscultation bilaterally Cardio: regular rate and rhythm GI: normal findings: bowel sounds normal  Wound Exam: clean, dry, intact dressing  Drainage:  None on dressing  Motor Exam:  EHL, FHL, Anterior Tibial and Posterior Tibial Intact  Sensory Exam: Superficial Peroneal, Deep Peroneal and Tibial normal  Vascular Exam: Right posterior tibial artery has 1+ (weak) pulse  Assessment:    1 Day Post-Op  Procedure(s) (LRB): TOTAL KNEE ARTHROPLASTY (Right)  ADDITIONAL DIAGNOSIS:  Active Problems:   S/P total knee replacement using cement     Plan: Physical Therapy as ordered Partial Weight Bearing @ 50% (PWB)  DVT Prophylaxis:  Xarelto, Foot Pumps and TED hose  DISCHARGE PLAN: Skilled Nursing Facility/Rehab tomorrow if able  DISCHARGE NEEDS: HHPT, CPM, Walker and 3-in-1 comode seat  Will check with SNF to see if able to d/c tomorrow afternoon  Hemovac pulled,  50 ml last shift        Kathleen Morales  05/03/2014 8:16 AM

## 2014-05-03 NOTE — Progress Notes (Signed)
Orthopedic Tech Progress Note Patient Details:  Kathleen Morales 1929/02/20 [id number]  Patient ID: [name], female   DOB: [birth year], 80 y.o.   MRN: [id number] Placed pt's rle in cpm @ 0-90 degrees @1525   Hildred Priest 05/03/2014, 3:19 PM

## 2014-05-03 NOTE — Evaluation (Signed)
Occupational Therapy Evaluation Patient Details Name: Kathleen Morales MRN: [id number] DOB: 1928/08/27 Today's Date: 05/03/2014    History of Present Illness 80 y.o. female admitted to [facility] on 05/02/14 for elective R TKA.  Pt with significant PMHx of L TKA (about 2 years ago), fibromyalgia, back pain (has had injections and wears a brace-although none present in hospital), and is [facility] (wears hearing aids).   Clinical Impression   Pt admitted with the above diagnoses and presents with below problem list. Pt will benefit from continued acute OT to address the below listed deficits and maximize independence with basic ADLs prior to d/c to next venue. PTA pt was independent with ADLs. Pt currently at min A level for LB ADLs.       Follow Up Recommendations  SNF;Other (comment) (pt pre-registered with [facility])    Equipment Recommendations  Other (comment) (defer to next venue)    Recommendations for Other Services       Precautions / Restrictions Precautions Precautions: Knee Precaution Comments: reviewed precautions, educated on PWB 50% status Restrictions Weight Bearing Restrictions: Yes RLE Weight Bearing: Partial weight bearing RLE Partial Weight Bearing Percentage or Pounds: 50%      Mobility Bed Mobility      General bed mobility comments: in recliner  Transfers Overall transfer level: Needs assistance Equipment used: Rolling walker (2 wheeled) Transfers: Sit to/from Stand Sit to Stand: Min guard;From elevated surface (cues for PWB 50% status)         General transfer comment: cues for PWB 50% status; sit<>stand completed from recliner and 3n1    Balance Overall balance assessment: Needs assistance Sitting-balance support: No upper extremity supported;Feet supported Sitting balance-Leahy Scale: Good     Standing balance support: Bilateral upper extremity supported;During functional activity Standing balance-Leahy Scale: Poor                               ADL Overall ADL's : Needs assistance/impaired Eating/Feeding: Set up;Sitting   Grooming: Set up;Sitting   Upper Body Bathing: Supervision/ safety;Set up;Sitting   Lower Body Bathing: Minimal assistance;Sit to/from stand   Upper Body Dressing : Set up;Sitting   Lower Body Dressing: Minimal assistance;Sit to/from stand   Toilet Transfer: Min guard;Ambulation;RW (3n1 over toilet)   Toileting- Clothing Manipulation and Hygiene: Min guard;Sit to/from stand   Tub/ Shower Transfer: Min guard;Ambulation;3 in 1;Rolling walker   Functional mobility during ADLs: Min guard;Rolling walker (cues for PWB 50% status) General ADL Comments: Basic ADL education provided. Pt at min A level for LB ADLs.     Vision                     Perception     Praxis      Pertinent Vitals/Pain Pain Assessment: 0-10 Pain Score: 5  Pain Location: R knee Pain Descriptors / Indicators: Aching Pain Intervention(s): Limited activity within patient's tolerance;Monitored during session;Repositioned;Ice applied     Hand Dominance     Extremity/Trunk Assessment Upper Extremity Assessment Upper Extremity Assessment: Overall WFL for tasks assessed   Lower Extremity Assessment Lower Extremity Assessment: Defer to PT evaluation   Cervical / Trunk Assessment Cervical / Trunk Assessment: Other exceptions Cervical / Trunk Exceptions: h/o low back pain listed in chart.    Communication Communication Communication: HOH;Other (comment) (wears hearing aids)   Cognition Arousal/Alertness: Awake/alert Behavior During Therapy: WFL for tasks assessed/performed Overall Cognitive Status: Within Functional Limits for tasks assessed  General Comments       Exercises       Shoulder Instructions      Home Living Family/patient expects to be discharged to:: Skilled nursing facility Living Arrangements: Spouse/significant other                                       Prior Functioning/Environment Level of Independence: Independent             OT Diagnosis: Acute pain   OT Problem List: Impaired balance (sitting and/or standing);Decreased knowledge of use of DME or AE;Decreased knowledge of precautions;Pain   OT Treatment/Interventions: Self-care/ADL training;Therapeutic exercise;DME and/or AE instruction;Therapeutic activities;Patient/family education;Balance training    OT Goals(Current goals can be found in the care plan section) Acute Rehab OT Goals Patient Stated Goal: not stated OT Goal Formulation: With patient Time For Goal Achievement: 05/10/14 Potential to Achieve Goals: Good ADL Goals Pt Will Perform Lower Body Bathing: with supervision;with adaptive equipment;sit to/from stand Pt Will Perform Lower Body Dressing: with supervision;with adaptive equipment;sit to/from stand Pt Will Transfer to Toilet: with supervision;ambulating (3n1 over toilet) Pt Will Perform Toileting - Clothing Manipulation and hygiene: with supervision;sit to/from stand Pt Will Perform Tub/Shower Transfer: with supervision;ambulating;3 in 1;rolling walker  OT Frequency: Min 2X/week   Barriers to D/C:            Co-evaluation              End of Session Equipment Utilized During Treatment: Gait belt;Rolling walker  Activity Tolerance: Patient tolerated treatment well Patient left: in chair;with call bell/phone within reach;with family/visitor present   Time: 1208-1228 OT Time Calculation (min): 20 min Charges:  OT General Charges $OT Visit: 1 Procedure OT Evaluation $Initial OT Evaluation Tier I: 1 Procedure OT Treatments $Self Care/Home Management : 8-22 mins G-Codes:    Hortencia Pilar 2014-05-19, 12:39 PM

## 2014-05-04 ENCOUNTER — Encounter (HOSPITAL_COMMUNITY): Payer: Self-pay | Admitting: Orthopaedic Surgery

## 2014-05-04 DIAGNOSIS — M1711 Unilateral primary osteoarthritis, right knee: Secondary | ICD-10-CM | POA: Diagnosis present

## 2014-05-04 LAB — CBC
HCT: 31.3 % — ABNORMAL LOW (ref 36.0–46.0)
HEMOGLOBIN: 10.5 g/dL — AB (ref 12.0–15.0)
MCH: 32.8 pg (ref 26.0–34.0)
MCHC: 33.5 g/dL (ref 30.0–36.0)
MCV: 97.8 fL (ref 78.0–100.0)
PLATELETS: 267 10*3/uL (ref 150–400)
RBC: 3.2 MIL/uL — AB (ref 3.87–5.11)
RDW: 13.5 % (ref 11.5–15.5)
WBC: 13.8 10*3/uL — AB (ref 4.0–10.5)

## 2014-05-04 LAB — BASIC METABOLIC PANEL
ANION GAP: 10 (ref 5–15)
BUN: 13 mg/dL (ref 6–23)
CHLORIDE: 106 meq/L (ref 96–112)
CO2: 27 meq/L (ref 19–32)
Calcium: 7.9 mg/dL — ABNORMAL LOW (ref 8.4–10.5)
Creatinine, Ser: 0.66 mg/dL (ref 0.50–1.10)
GFR calc Af Amer: >90 mL/min (ref >90)
GFR calc non Af Amer: 79 mL/min — ABNORMAL LOW (ref >90)
GLUCOSE: 104 mg/dL — AB (ref 70–99)
Potassium: 3.7 mEq/L (ref 3.7–5.3)
SODIUM: 143 meq/L (ref 137–147)

## 2014-05-04 LAB — URINE CULTURE

## 2014-05-04 MED ORDER — OXYCODONE HCL 5 MG PO TABS: 5.0000 mg | ORAL_TABLET | ORAL | Status: DC | PRN

## 2014-05-04 MED ORDER — METHOCARBAMOL 500 MG PO TABS: 500.0000 mg | ORAL_TABLET | Freq: Three times a day (TID) | ORAL | Status: DC | PRN

## 2014-05-04 MED ORDER — CEPHALEXIN 500 MG PO CAPS
500.0000 mg | ORAL_CAPSULE | Freq: Three times a day (TID) | ORAL | Status: DC
  Administered 2014-05-04: 500 mg via ORAL
  Filled 2014-05-04 (×4): qty 1

## 2014-05-04 MED ORDER — CEPHALEXIN 500 MG PO CAPS: 500.0000 mg | ORAL_CAPSULE | Freq: Three times a day (TID) | ORAL | Status: DC

## 2014-05-04 MED ORDER — DSS 100 MG PO CAPS: 100.0000 mg | ORAL_CAPSULE | Freq: Two times a day (BID) | ORAL | Status: DC

## 2014-05-04 MED ORDER — RIVAROXABAN 10 MG PO TABS: 10.0000 mg | ORAL_TABLET | ORAL | Status: DC

## 2014-05-04 NOTE — Clinical Social Work Psychosocial (Signed)
Clinical Social Work Department BRIEF PSYCHOSOCIAL ASSESSMENT 05/04/2014  Patient:  Kathleen Morales, Kathleen Morales     Account Number:  [id number]     Admit date:  05/02/2014  Clinical Social Worker:  Marciano Sequin  Date/Time:  05/04/2014 10:00 AM  Referred by:  RN  Date Referred:  05/04/2014 Referred for  SNF Placement   Other Referral:   Interview type:  Patient Other interview type:    PSYCHOSOCIAL DATA Living Status:  FACILITY Admitted from facility:  [facility] Level of care:  [place] Primary support name:  Hammond,Harold (boyfirend) Primary support relationship to patient:   Degree of support available:   strong    CURRENT CONCERNS  Other Concerns:    SOCIAL WORK ASSESSMENT / PLAN CSW met pt at bedside. CSW introduce self and purpose of visit. Pt reported being a residented at [facility]. CSW and pt dicussed dicharge back to [facility] today. Pt agreed and reported being ready to discharge. Pt reported her boyfriend will drive her to [facility]. CSW sign off.   Assessment/plan status:  No Further Intervention Required Other assessment/ plan:   Information/referral to community resources:   return to [facility]    PATIENT'S/FAMILY'S RESPONSE TO PLAN OF CARE: Agreed       Greta Doom, MSW, [place]

## 2014-05-04 NOTE — Discharge Instructions (Signed)
Information on my medicine - XARELTO (Rivaroxaban)  Why was Xarelto prescribed for you? Xarelto was prescribed for you to reduce the risk of blood clots forming after orthopedic surgery. The medical term for these abnormal blood clots is venous thromboembolism (VTE).  What do you need to know about xarelto ? Take your Xarelto ONCE DAILY at the same time every day. You may take it either with or without food.  If you have difficulty swallowing the tablet whole, you may crush it and mix in applesauce just prior to taking your dose.  Take Xarelto exactly as prescribed by your doctor and DO NOT stop taking Xarelto without talking to the doctor who prescribed the medication.  Stopping without other VTE prevention medication to take the place of Xarelto may increase your risk of developing a clot.  After discharge, you should have regular check-up appointments with your healthcare provider that is prescribing your Xarelto.    What do you do if you miss a dose? If you miss a dose, take it as soon as you remember on the same day then continue your regularly scheduled once daily regimen the next day. Do not take two doses of Xarelto on the same day.   Important Safety Information A possible side effect of Xarelto is bleeding. You should call your healthcare provider right away if you experience any of the following:   Bleeding from an injury or your nose that does not stop.   Unusual colored urine (red or dark brown) or unusual colored stools (red or black).   Unusual bruising for unknown reasons.   A serious fall or if you hit your head (even if there is no bleeding).  Some medicines may interact with Xarelto and might increase your risk of bleeding while on Xarelto. To help avoid this, consult your healthcare provider or pharmacist prior to using any new prescription or non-prescription medications, including herbals, vitamins, non-steroidal anti-inflammatory drugs (NSAIDs) and  supplements.  This website has more information on Xarelto: [email or web address].[email or web address]

## 2014-05-04 NOTE — Discharge Summary (Signed)
[name], MD   Biagio Borg, PA-C [address], [place], [place]  [address]                             [phone]  PATIENT ID: Kathleen Morales        MRN:  [id number]          DOB/AGE: Aug 10, 1928 / 80 y.o.    DISCHARGE SUMMARY  ADMISSION DATE:    05/02/2014 DISCHARGE DATE:   05/04/2014   ADMISSION DIAGNOSIS: RIGHT KNEE OSTEOARTHRITIS    DISCHARGE DIAGNOSIS:  RIGHT KNEE OSTEOARTHRITIS    ADDITIONAL DIAGNOSIS: Principal Problem:   Osteoarthritis of right knee Active Problems:   S/P total knee replacement using cement  Past Medical History  Diagnosis Date  . Ulcerative colitis 1970    takes Colazal bid  . Diverticulosis   . Hemorrhoids   . Polymyalgia rheumatica   . History of Ida spotted fever     Elevated titer 04/2006  . 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 Hoarseness     pt states always like this;ENT can't find anything wrong  . Joint pain   . Joint swelling   . Bruises easily   . Gastric ulcer     hx of ;many yrs ago  . Hx of colonic polyps   . Nocturia   . Arthritis     takes Prednisone daily for inflammation  . Fibromyalgia   . Spine disorder     has had injections and wears a brace    PROCEDURE: Procedure(s): TOTAL KNEE ARTHROPLASTY Right on 05/02/2014  CONSULTS: none     HISTORY: Kathleen Morales is a very pleasant 80 year old white female who is seen today for evaluation of her right knee. She has been seen previously with significant pain and discomfort in both knees but more in her right knee. She has had Euflexxa injections as well as corticosteroid injections, but unfortunately she has started to fail in getting any relief. She has used Voltaren gel which also does not seem to be working very well. She has had a left total knee replacement which has done fairly well. She is now having pain at nighttime as well as pain with every step. She has tried as much as she can of anti-inflammatories, but they have not been very  beneficial. Certainly because of her age she feels it is not wise to continue the anti-inflammatories. She is now having problems with activities of daily living as well as pain with every step and nighttime pain   HOSPITAL COURSE:  Kathleen Morales is a 80 y.o. admitted on 05/02/2014 and found to have a diagnosis of [place].  After appropriate laboratory studies were obtained  they were taken to the operating room on 05/02/2014 and underwent  Procedure(s): TOTAL KNEE ARTHROPLASTY  Right.   They were given perioperative antibiotics:  Anti-infectives   Start     Dose/Rate Route Frequency Ordered Stop   05/04/14 0830  cephALEXin (KEFLEX) capsule 500 mg     500 mg Oral 3 times per day 05/04/14 0820     05/04/14 0000  cephALEXin (KEFLEX) 500 MG capsule     500 mg Oral Every 8 hours 05/04/14 1044     05/02/14 1330  ceFAZolin (ANCEF) IVPB 2 g/50 mL premix     2 g 100 mL/hr over 30 Minutes Intravenous Every 6 hours 05/02/14 1206 05/02/14 2248   05/02/14 0600  ceFAZolin (ANCEF) IVPB 2 g/50 mL premix     2 g 100 mL/hr over 30 Minutes Intravenous On call to O.R. 05/01/14 1458 05/02/14 0735    .  Tolerated the procedure well.  Placed with a foley intraoperatively.    Toradol was given post op.  POD #1, allowed out of bed to a chair.  PT for ambulation and exercise program.  Foley D/C'd in morning.  IV saline locked.  O2 discontionued.  Hemovac pulled.  POD #2, continued PT and ambulation.    The remainder of the hospital course was dedicated to ambulation and strengthening.   The patient was discharged on 2 Days Post-Op in  Stable condition.  Blood products given:none  DIAGNOSTIC STUDIES: Recent vital signs: Patient Vitals for the past 24 hrs:  BP Temp Pulse Resp SpO2  05/04/14 0900 136/46 mmHg - 68 16 -  05/04/14 0552 155/69 mmHg 99.1 F (37.3 C) 86 16 96 %  05/03/14 2009 160/69 mmHg 98.7 F (37.1 C) 81 16 96 %  05/03/14 1400 115/48 mmHg 98.8 F (37.1 C) 71 16 100 %        Recent laboratory studies:  Recent Labs  05/03/14 0850 05/04/14 0540  WBC 18.0* 13.8*  HGB 11.6* 10.5*  HCT 34.1* 31.3*  PLT 279 267    Recent Labs  05/03/14 0850 05/04/14 0540  NA 136* 143  K 3.8 3.7  CL 100 106  CO2 25 27  BUN 14 13  CREATININE 0.73 0.66  GLUCOSE 133* 104*  CALCIUM 8.3* 7.9*   Lab Results  Component Value Date   INR 0.99 04/26/2014   INR 1.03 04/01/2012     Recent Radiographic Studies :  Chest 2 View  04/26/2014   CLINICAL DATA:  Preoperative films.  EXAM: CHEST  2 VIEW  COMPARISON:  PA and lateral chest 03/24/2013.  FINDINGS: Lungs are clear. Heart size is normal. No pneumothorax or pleural effusion. Convex left scoliosis noted.  IMPRESSION: No acute disease.   Electronically Signed   By: Inge Rise M.D.   On: 04/26/2014 14:46    DISCHARGE INSTRUCTIONS: Discharge Instructions   CPM    Complete by:  As directed   Continuous passive motion machine (CPM):      Use the CPM from 0 to 60 for 6-8 hours per day.      You may increase by 5-10 per day.  You may break it up into 2 or 3 sessions per day.      Use CPM for 3-4 weeks or until you are told to stop.     Call MD / Call 911    Complete by:  As directed   If you experience chest pain or shortness of breath, CALL 911 and be transported to the hospital emergency room.  If you develop a fever above 101 F, pus (white drainage) or increased drainage or redness at the wound, or calf pain, call your surgeon's office.     Change dressing    Complete by:  As directed   DO NOT CHANGE DRESSING.  KEEP ON TILL SEEN IN OFFICE/     Constipation Prevention    Complete by:  As directed   Drink plenty of fluids.  Prune juice and/or coffee may be helpful.  You may use a stool softener, such as Colace (over the counter) 100 mg twice a day.  Use MiraLax (over the counter) for constipation as needed but this may take several days to work.  Mag Citrate --  OR-- Milk of Magnesia --OR -- Dulcolax pills/suppositories  may also be used but follow directions on the label.     Diet general    Complete by:  As directed      Discharge instructions    Complete by:  As directed   YOU WERE GIVEN A DEVICE CALLED AN INCENTIVE SPIROMETER TO HELP YOU TAKE DEEP BREATHS.  PLEASE USE THIS AT LEAST TEN (10) TIMES EVERY 1-2 HOURS EVERY DAY TO PREVENT PNEUMONIA.     Do not put a pillow under the knee. Place it under the heel.    Complete by:  As directed   [place] [facility]     Driving restrictions    Complete by:  As directed   No driving for 6 weeks     Increase activity slowly as tolerated    Complete by:  As directed      Lifting restrictions    Complete by:  As directed   No lifting for 6 weeks     Partial weight bearing    Complete by:  As directed   50 % WEIGHT BEARING AS TAUGHT IN PHYSICAL THERAPY     Patient may shower    Complete by:  As directed   You may shower over the brown dressing.     TED hose    Complete by:  As directed   Use stockings (TED hose) for 2 weeks on operative leg(s).  You may remove them at night for sleeping.           DISCHARGE MEDICATIONS:     Medication List    STOP taking these medications       VOLTAREN 1 % Gel  Generic drug:  diclofenac sodium      TAKE these medications       balsalazide 750 MG capsule  Commonly known as:  COLAZAL  Take 3 capsules (2,250 mg total) by mouth 3 (three) times daily.     Biotin 1000 MCG tablet  Take 1,000 mcg by mouth daily.     cephALEXin 500 MG capsule  Commonly known as:  KEFLEX  Take 1 capsule (500 mg total) by mouth every 8 (eight) hours.     DSS 100 MG Caps  Take 100 mg by mouth 2 (two) times daily.     estradiol 0.5 MG tablet  Commonly known as:  ESTRACE  Take 0.5 mg by mouth at bedtime.     methocarbamol 500 MG tablet  Commonly known as:  ROBAXIN  Take 1 tablet (500 mg total) by mouth every 8 (eight) hours as needed for muscle spasms.     oxyCODONE 5 MG immediate release tablet    Commonly known as:  Oxy IR/ROXICODONE  Take 1-2 tablets (5-10 mg total) by mouth every 4 (four) hours as needed for breakthrough pain.     predniSONE 5 MG tablet  Commonly known as:  DELTASONE  Take 1 tablet (5 mg total) by mouth daily with breakfast.     rivaroxaban 10 MG Tabs tablet  Commonly known as:  XARELTO  Take 1 tablet (10 mg total) by mouth daily.     vitamin B-12 1000 MCG tablet  Commonly known as:  CYANOCOBALAMIN  Take 1,000 mcg by mouth daily.     Vitamin D3 2000 UNITS Tabs  Take 2,000 Units by mouth daily.        FOLLOW UP VISIT:       Follow-up Information  Follow up with [name], MD On 05/15/2014.   Specialty:  Orthopedic Surgery   Contact information:   [place] [place] [address] [phone]       DISPOSITION:   Skilled Nursing Facility/Rehab  CONDITION:  Stable   Mike Craze. [place], [place] [phone]  05/04/2014 10:45 AM

## 2014-05-04 NOTE — Progress Notes (Signed)
Physical Therapy Treatment Patient Details Name: Kathleen Morales MRN: [id number] DOB: October 26, 1928 Today's Date: 05/04/2014    History of Present Illness 80 y.o. female admitted [facility]14 for elective R TKA.  Pt with significant PMHx of L TKA (about 2 years ago), fibromyalgia, back pain (has had injections and wears a brace-although none present in hospital), and is [facility] (wears hearing aids).    PT Comments    Patient progressing with overall mobility this session. A little more sore today than yesterday. Patient planning to DC to [facility] once medically ready  Follow Up Recommendations  SNF     Equipment Recommendations  None recommended by PT    Recommendations for Other Services       Precautions / Restrictions Precautions Precautions: Knee Restrictions RLE Weight Bearing: Partial weight bearing RLE Partial Weight Bearing Percentage or Pounds: 50%    Mobility  Bed Mobility Overal bed mobility: Needs Assistance Bed Mobility: Supine to Sit     Supine to sit: Min assist     General bed mobility comments: A for R LE out of bed and cues for technique   Transfers Overall transfer level: Needs assistance Equipment used: Rolling walker (2 wheeled)   Sit to Stand: Min guard         General transfer comment: Cues for safe hand placement and to control descent onto recliner  Ambulation/Gait Ambulation/Gait assistance: Min guard Ambulation Distance (Feet): 80 Feet Assistive device: Rolling walker (2 wheeled) Gait Pattern/deviations: Step-to pattern;Decreased step length - left;Decreased stance time - right Gait velocity: decreased Gait velocity interpretation: Below normal speed for age/gender General Gait Details: Cues for management of RW and to ensure 50% PWB   Stairs            Wheelchair Mobility    Modified Rankin (Stroke Patients Only)       Balance                                    Cognition Arousal/Alertness:  Awake/alert Behavior During Therapy: WFL for tasks assessed/performed Overall Cognitive Status: Within Functional Limits for tasks assessed                      Exercises Total Joint Exercises Quad Sets: AROM;Right;10 reps Heel Slides: AAROM;Right;10 reps Hip ABduction/ADduction: AAROM;Right;10 reps Straight Leg Raises: AAROM;Right;10 reps Long Arc Quad: AAROM;Right;10 reps    General Comments        Pertinent Vitals/Pain Pain Score: 5  Pain Location: R knee Pain Descriptors / Indicators: Sore Pain Intervention(s): Limited activity within patient's tolerance;Monitored during session    Home Living                      Prior Function            PT Goals (current goals can now be found in the care plan section) Progress towards PT goals: Progressing toward goals    Frequency  7X/week    PT Plan Current plan remains appropriate    Co-evaluation             End of Session Equipment Utilized During Treatment: Gait belt Activity Tolerance: Patient tolerated treatment well Patient left: in chair;with call bell/phone within reach;with family/visitor present     Time: 1540-0867 PT Time Calculation (min): 24 min  Charges:  $Gait Training: 8-22 mins $Therapeutic Exercise: 8-22 mins  G Codes:      Jacqualyn Posey 05/04/2014, 9:07 AM  05/04/2014 Jacqualyn Posey PTA [phone] pager [phone] office

## 2014-05-08 ENCOUNTER — Ambulatory Visit (HOSPITAL_COMMUNITY)
Admission: RE | Admit: 2014-05-08 | Discharge: 2014-05-08 | Disposition: A | Discharge status: Home / Self Care | Payer: Medicare HMO | Source: Ambulatory Visit | Attending: Orthopaedic Surgery | Admitting: Orthopaedic Surgery

## 2014-05-08 ENCOUNTER — Other Ambulatory Visit (HOSPITAL_COMMUNITY): Payer: Self-pay | Admitting: Orthopaedic Surgery

## 2014-05-08 DIAGNOSIS — M7989 Other specified soft tissue disorders: Secondary | ICD-10-CM

## 2014-05-08 DIAGNOSIS — M79609 Pain in unspecified limb: Secondary | ICD-10-CM | POA: Diagnosis not present

## 2014-05-08 NOTE — Progress Notes (Addendum)
*  PRELIMINARY RESULTS* Vascular Ultrasound Right lower extremity venous duplex has been completed.  Preliminary findings: no evidence of DVT. Baker's cyst noted.   Called results to [place], [place], RDMS, RVT  05/08/2014, 3:05 PM

## 2014-05-15 ENCOUNTER — Other Ambulatory Visit (HOSPITAL_COMMUNITY): Payer: Medicare HMO

## 2014-08-21 ENCOUNTER — Telehealth: Payer: Self-pay

## 2014-08-21 DIAGNOSIS — K869 Disease of pancreas, unspecified: Secondary | ICD-10-CM

## 2014-08-21 NOTE — Telephone Encounter (Signed)
Left message for patient to call back to discuss scheduling MRI

## 2014-08-21 NOTE — Telephone Encounter (Signed)
-----   Message from Kellie Moor, RN sent at 03/21/2014  3:46 PM EDT ----- Needs MRI in 6 months.  See results from 8/25

## 2014-08-22 NOTE — Telephone Encounter (Signed)
Left message for patient to call back  

## 2014-08-23 NOTE — Telephone Encounter (Signed)
MRCP scheduled with the patient at [address] for 09/01/14 8:00 arrive 7:40 for 8:00.  She is asked to be NPO after midnight and to come by and have labs one day next week.

## 2014-08-29 ENCOUNTER — Other Ambulatory Visit: Payer: Self-pay

## 2014-08-29 ENCOUNTER — Other Ambulatory Visit (INDEPENDENT_AMBULATORY_CARE_PROVIDER_SITE_OTHER): Payer: PPO

## 2014-08-29 DIAGNOSIS — M2550 Pain in unspecified joint: Secondary | ICD-10-CM

## 2014-08-29 DIAGNOSIS — Z79899 Other long term (current) drug therapy: Secondary | ICD-10-CM

## 2014-08-29 DIAGNOSIS — E559 Vitamin D deficiency, unspecified: Secondary | ICD-10-CM

## 2014-08-29 DIAGNOSIS — K869 Disease of pancreas, unspecified: Secondary | ICD-10-CM

## 2014-08-29 LAB — BUN: BUN: 14 mg/dL (ref 6–23)

## 2014-08-29 LAB — CBC WITH DIFFERENTIAL/PLATELET
BASOS PCT: 0.5 % (ref 0.0–3.0)
Basophils Absolute: 0 10*3/uL (ref 0.0–0.1)
Eosinophils Absolute: 0 10*3/uL (ref 0.0–0.7)
Eosinophils Relative: 0.3 % (ref 0.0–5.0)
HCT: 42.9 % (ref 36.0–46.0)
Hemoglobin: 14.5 g/dL (ref 12.0–15.0)
Lymphocytes Relative: 22.7 % (ref 12.0–46.0)
Lymphs Abs: 2.1 10*3/uL (ref 0.7–4.0)
MCHC: 33.8 g/dL (ref 30.0–36.0)
MCV: 91.3 fl (ref 78.0–100.0)
MONOS PCT: 3.7 % (ref 3.0–12.0)
Monocytes Absolute: 0.3 10*3/uL (ref 0.1–1.0)
NEUTROS ABS: 6.8 10*3/uL (ref 1.4–7.7)
NEUTROS PCT: 72.8 % (ref 43.0–77.0)
Platelets: 329 10*3/uL (ref 150.0–400.0)
RBC: 4.7 Mil/uL (ref 3.87–5.11)
RDW: 15.4 % (ref 11.5–15.5)
WBC: 9.3 10*3/uL (ref 4.0–10.5)

## 2014-08-29 LAB — COMPREHENSIVE METABOLIC PANEL
ALK PHOS: 70 U/L (ref 39–117)
ALT: 11 U/L (ref 0–35)
AST: 18 U/L (ref 0–37)
Albumin: 4.2 g/dL (ref 3.5–5.2)
BILIRUBIN TOTAL: 0.4 mg/dL (ref 0.2–1.2)
BUN: 14 mg/dL (ref 6–23)
CO2: 29 meq/L (ref 19–32)
Calcium: 10 mg/dL (ref 8.4–10.5)
Chloride: 105 mEq/L (ref 96–112)
Creatinine, Ser: 0.7 mg/dL (ref 0.40–1.20)
GFR: 84.72 mL/min (ref >60.00)
Glucose, Bld: 103 mg/dL — ABNORMAL HIGH (ref 70–99)
Potassium: 5.1 mEq/L (ref 3.5–5.1)
Sodium: 140 mEq/L (ref 135–145)
Total Protein: 6.9 g/dL (ref 6.0–8.3)

## 2014-08-29 LAB — SEDIMENTATION RATE: Sed Rate: 6 mm/hr (ref 0–22)

## 2014-08-29 LAB — VITAMIN D 25 HYDROXY (VIT D DEFICIENCY, FRACTURES): VITD: 44.18 ng/mL (ref 30.00–100.00)

## 2014-08-29 LAB — CREATININE, SERUM: Creatinine, Ser: 0.7 mg/dL (ref 0.40–1.20)

## 2014-09-01 ENCOUNTER — Ambulatory Visit
Admission: RE | Admit: 2014-09-01 | Discharge: 2014-09-01 | Disposition: A | Discharge status: Home / Self Care | Payer: PPO | Source: Ambulatory Visit | Attending: Gastroenterology | Admitting: Gastroenterology

## 2014-09-01 DIAGNOSIS — K869 Disease of pancreas, unspecified: Secondary | ICD-10-CM

## 2014-09-01 MED ORDER — GADOBENATE DIMEGLUMINE 529 MG/ML IV SOLN
12.0000 mL | Freq: Once | INTRAVENOUS | Status: AC | PRN
  Administered 2014-09-01: 12 mL via INTRAVENOUS

## 2014-09-04 ENCOUNTER — Other Ambulatory Visit: Payer: Self-pay

## 2014-09-04 MED ORDER — BALSALAZIDE DISODIUM 750 MG PO CAPS: 2250.0000 mg | ORAL_CAPSULE | Freq: Three times a day (TID) | ORAL | Status: DC

## 2015-04-09 ENCOUNTER — Telehealth: Payer: Self-pay | Admitting: Gastroenterology

## 2015-04-09 NOTE — Telephone Encounter (Signed)
Called patient and line was busy.

## 2015-04-10 MED ORDER — BALSALAZIDE DISODIUM 750 MG PO CAPS: 2250.0000 mg | ORAL_CAPSULE | Freq: Three times a day (TID) | ORAL | Status: DC

## 2015-04-10 NOTE — Telephone Encounter (Signed)
Patient is out medication and wants to know if she needs a follow up or does she just needs refills. Informed patient she is due for a follow up visit now. Pt scheduled f/u appt for 04/25/15 at 3:45pm. Told patient we will send refills until her appt but to keep the appt for any further refills. Pt verbalized understanding.

## 2015-04-25 ENCOUNTER — Other Ambulatory Visit (INDEPENDENT_AMBULATORY_CARE_PROVIDER_SITE_OTHER): Payer: PPO

## 2015-04-25 ENCOUNTER — Encounter: Payer: Self-pay | Admitting: Gastroenterology

## 2015-04-25 ENCOUNTER — Ambulatory Visit (INDEPENDENT_AMBULATORY_CARE_PROVIDER_SITE_OTHER): Payer: PPO | Admitting: Gastroenterology

## 2015-04-25 VITALS — BP 136/72 | HR 76 | Ht 60.5 in | Wt 138.5 lb

## 2015-04-25 DIAGNOSIS — R933 Abnormal findings on diagnostic imaging of other parts of digestive tract: Secondary | ICD-10-CM

## 2015-04-25 DIAGNOSIS — K51919 Ulcerative colitis, unspecified with unspecified complications: Secondary | ICD-10-CM | POA: Diagnosis not present

## 2015-04-25 LAB — BASIC METABOLIC PANEL
BUN: 15 mg/dL (ref 6–23)
CHLORIDE: 107 meq/L (ref 96–112)
CO2: 32 meq/L (ref 19–32)
CREATININE: 0.64 mg/dL (ref 0.40–1.20)
Calcium: 9.1 mg/dL (ref 8.4–10.5)
GFR: 93.8 mL/min (ref >60.00)
Glucose, Bld: 99 mg/dL (ref 70–99)
Potassium: 4.1 mEq/L (ref 3.5–5.1)
SODIUM: 143 meq/L (ref 135–145)

## 2015-04-25 LAB — CBC WITH DIFFERENTIAL/PLATELET
BASOS ABS: 0 10*3/uL (ref 0.0–0.1)
Basophils Relative: 0.4 % (ref 0.0–3.0)
EOS ABS: 0.1 10*3/uL (ref 0.0–0.7)
Eosinophils Relative: 0.7 % (ref 0.0–5.0)
HCT: 39.5 % (ref 36.0–46.0)
Hemoglobin: 13.2 g/dL (ref 12.0–15.0)
LYMPHS ABS: 2.2 10*3/uL (ref 0.7–4.0)
Lymphocytes Relative: 22.8 % (ref 12.0–46.0)
MCHC: 33.5 g/dL (ref 30.0–36.0)
MCV: 98.1 fl (ref 78.0–100.0)
Monocytes Absolute: 0.7 10*3/uL (ref 0.1–1.0)
Monocytes Relative: 7.3 % (ref 3.0–12.0)
NEUTROS ABS: 6.7 10*3/uL (ref 1.4–7.7)
NEUTROS PCT: 68.8 % (ref 43.0–77.0)
PLATELETS: 311 10*3/uL (ref 150.0–400.0)
RBC: 4.03 Mil/uL (ref 3.87–5.11)
RDW: 13.4 % (ref 11.5–15.5)
WBC: 9.8 10*3/uL (ref 4.0–10.5)

## 2015-04-25 LAB — HEPATIC FUNCTION PANEL
ALK PHOS: 59 U/L (ref 39–117)
ALT: 11 U/L (ref 0–35)
AST: 19 U/L (ref 0–37)
Albumin: 3.7 g/dL (ref 3.5–5.2)
BILIRUBIN DIRECT: 0 mg/dL (ref 0.0–0.3)
TOTAL PROTEIN: 6.1 g/dL (ref 6.0–8.3)
Total Bilirubin: 0.3 mg/dL (ref 0.2–1.2)

## 2015-04-25 LAB — TSH: TSH: 0.61 u[IU]/mL (ref 0.35–4.50)

## 2015-04-25 MED ORDER — BALSALAZIDE DISODIUM 750 MG PO CAPS: 2250.0000 mg | ORAL_CAPSULE | Freq: Three times a day (TID) | ORAL | Status: DC

## 2015-04-25 NOTE — Patient Instructions (Signed)
We have sent the following medications to your pharmacy for you to pick up at your convenience: [facility] has requested that you go to the basement for the following lab work before leaving today: CBC,BMP,Hepatic panel and TSH.  Follow up in one year.  Thank you for choosing me and [facility].  Pricilla Riffle. Dagoberto Ligas., MD., Marval Regal

## 2015-04-25 NOTE — Progress Notes (Signed)
    History of Present Illness: This is an 81 year old female returning for follow-up of ulcerative colitis. She is accompanied by her daughter. She says she has been doing very well with no active GI symptoms.  Current Medications, Allergies, Past Medical History, Past Surgical History, Family History and Social History were reviewed in [employer] record.  Physical Exam: General: Well developed , well nourished, no acute distress Head: Normocephalic and atraumatic Eyes:  sclerae anicteric, EOMI Ears: Normal auditory acuity Mouth: No deformity or lesions Lungs: Clear throughout to auscultation Heart: Regular rate and rhythm; no murmurs, rubs or bruits Abdomen: Soft, non tender and non distended. No masses, hepatosplenomegaly or hernias noted. Normal Bowel sounds Musculoskeletal: Symmetrical with no gross deformities  Pulses:  Normal pulses noted Extremities: No clubbing, cyanosis, edema or deformities noted Neurological: Alert oriented x 4, grossly nonfocal Psychological:  Alert and cooperative. Normal mood and affect  Assessment and Recommendations:  1. Ulcerative colitis, left-sided. Symptoms well controlled. Continue balsalazide 750 mg 3 po tid long-term. CMP, CBC, TSH today.  2. Pancreatic body cystic lesion. Imaging findings consistent with a side branch intraductal papillary mucinous tumor. Stable in size on imaging in February 2016. Repeat MRI/MRCP in 08/2015.  I spent 15 minutes of face-to-face time with the patient. Greater than 50% of the time was spent counseling and coordinating care.

## 2015-08-06 DIAGNOSIS — M85851 Other specified disorders of bone density and structure, right thigh: Secondary | ICD-10-CM | POA: Diagnosis not present

## 2015-08-06 DIAGNOSIS — M85852 Other specified disorders of bone density and structure, left thigh: Secondary | ICD-10-CM | POA: Diagnosis not present

## 2015-09-03 ENCOUNTER — Telehealth: Payer: Self-pay

## 2015-09-03 DIAGNOSIS — R932 Abnormal findings on diagnostic imaging of liver and biliary tract: Secondary | ICD-10-CM

## 2015-09-03 NOTE — Telephone Encounter (Signed)
Patient notified of MRI for 09/17/15 9:00 at [facility].    She is advised to come for BUN and creatinine prior to MRI and be NPO after midnight.

## 2015-09-03 NOTE — Telephone Encounter (Signed)
-----   Message from Kellie Moor, RN sent at 09/01/2014  1:51 PM EST ----- Needs MRI see results from 2/5

## 2015-09-11 ENCOUNTER — Other Ambulatory Visit (INDEPENDENT_AMBULATORY_CARE_PROVIDER_SITE_OTHER): Payer: PPO

## 2015-09-11 DIAGNOSIS — R932 Abnormal findings on diagnostic imaging of liver and biliary tract: Secondary | ICD-10-CM | POA: Diagnosis not present

## 2015-09-11 LAB — BUN: BUN: 10 mg/dL (ref 6–23)

## 2015-09-11 LAB — CREATININE, SERUM: Creatinine, Ser: 0.71 mg/dL (ref 0.40–1.20)

## 2015-09-17 ENCOUNTER — Ambulatory Visit (HOSPITAL_COMMUNITY)
Admission: RE | Admit: 2015-09-17 | Discharge: 2015-09-17 | Disposition: A | Discharge status: Home / Self Care | Payer: PPO | Source: Ambulatory Visit | Attending: Gastroenterology | Admitting: Gastroenterology

## 2015-09-17 ENCOUNTER — Other Ambulatory Visit: Payer: Self-pay | Admitting: Gastroenterology

## 2015-09-17 DIAGNOSIS — K862 Cyst of pancreas: Secondary | ICD-10-CM | POA: Diagnosis not present

## 2015-09-17 DIAGNOSIS — R932 Abnormal findings on diagnostic imaging of liver and biliary tract: Secondary | ICD-10-CM

## 2015-09-17 DIAGNOSIS — K869 Disease of pancreas, unspecified: Secondary | ICD-10-CM | POA: Insufficient documentation

## 2015-09-17 DIAGNOSIS — R935 Abnormal findings on diagnostic imaging of other abdominal regions, including retroperitoneum: Secondary | ICD-10-CM | POA: Diagnosis not present

## 2015-09-17 MED ORDER — GADOBENATE DIMEGLUMINE 529 MG/ML IV SOLN
15.0000 mL | Freq: Once | INTRAVENOUS | Status: AC | PRN
  Administered 2015-09-17: 12 mL via INTRAVENOUS

## 2015-09-26 ENCOUNTER — Encounter: Payer: Self-pay | Admitting: Neurology

## 2015-09-26 ENCOUNTER — Ambulatory Visit (INDEPENDENT_AMBULATORY_CARE_PROVIDER_SITE_OTHER): Payer: PPO | Admitting: Neurology

## 2015-09-26 VITALS — BP 132/68 | HR 71 | Ht 60.5 in | Wt 138.0 lb

## 2015-09-26 DIAGNOSIS — M5416 Radiculopathy, lumbar region: Secondary | ICD-10-CM | POA: Diagnosis not present

## 2015-09-26 MED ORDER — GABAPENTIN 100 MG PO CAPS: 100.0000 mg | ORAL_CAPSULE | Freq: Three times a day (TID) | ORAL | Status: DC

## 2015-09-26 NOTE — Progress Notes (Signed)
PATIENT: Kathleen Morales DOB: [birth year]  Chief Complaint  Patient presents with  . Back and Leg Pain    She is here with her friend and housemate, Kathleen Morales.  She is having pain across her low back and down her bilateral legs.  Her right leg is worse and hurts down to her calf.  The pain is mild on her left side and stops at her knee.     HISTORICAL  Kathleen Morales is 82 years old right-handed female, company by her housemate Kathleen Morales, seen in refer by Bo Merino, for evaluation of gait difficulty, low back pain, radiating pain to bilateral lower extremity, her primary care is [name], [place].  She had a history of polymyalgia rheumatica since 2007, has been treated with low-dose prednisone, osteoarthritis, history of ulcerative colitis, bilateral knee replacement  She had chronic low back pain, progressive worsened since 2015, she noticed radiating pain down the right lateral thigh, numbness achiness at right lateral leg, she also had a gradual worsening gait difficulty, poor balance, she continue has multiple joints pain, despite bilateral knee replacement, she also complains of significant right shoulder pain, limited range of motion of her right shoulder, cracking sound of her neck when she turns around  She denies bowel and bladder incontinence, she has no bilateral hands paresthesia or weakness, she complains of bilateral plantar feet numbness tingling since 2016.  REVIEW OF SYSTEMS: Full 14 system review of systems performed and notable only for joint pain, swelling, achy muscles  ALLERGIES: Allergies  Allergen Reactions  . Xarelto [Rivaroxaban] Swelling    Legs turned black    HOME MEDICATIONS: Current Outpatient Prescriptions  Medication Sig Dispense Refill  . balsalazide (COLAZAL) 750 MG capsule Take 3 capsules (2,250 mg total) by mouth 3 (three) times daily. 270 capsule 11  . Biotin 1000 MCG tablet Take 1,000 mcg by mouth daily.    . Cholecalciferol (VITAMIN  D3) 2000 UNITS TABS Take 2,000 Units by mouth daily.    Marland Kitchen estradiol (ESTRACE) 0.5 MG tablet Take 0.5 mg by mouth at bedtime.     . Omega-3 Fatty Acids (FISH OIL PO) Take by mouth daily.    . predniSONE (DELTASONE) 5 MG tablet Take 1 tablet (5 mg total) by mouth daily with breakfast. 30 tablet 1  . vitamin B-12 (CYANOCOBALAMIN) 1000 MCG tablet Take 1,000 mcg by mouth daily.     No current facility-administered medications for this visit.    PAST MEDICAL HISTORY: Past Medical History  Diagnosis Date  . Ulcerative colitis 1970    takes Colazal bid  . Diverticulosis   . Hemorrhoids   . Polymyalgia rheumatica ([place])   . History of [place] spotted fever     Elevated titer 04/2006  . 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 Hoarseness     pt states always like this;ENT can't find anything wrong  . Joint pain   . Joint swelling   . Bruises easily   . Gastric ulcer     hx of ;many yrs ago  . Hx of colonic polyps   . Nocturia   . Arthritis     takes Prednisone daily for inflammation  . Fibromyalgia   . Spine disorder     has had injections and wears a brace  . Leg pain     PAST SURGICAL HISTORY: Past Surgical History  Procedure Laterality Date  . Tubal ligation    . Cystectomy      Breast x  2-Right  . Partial hysterectomy      at age 65  . Epidural injections      x 2   . Carpal tunnel release      bilateral  . Colonoscopy    . Cataract extraction, bilateral    . Total knee arthroplasty  04/06/2012    Procedure: TOTAL KNEE ARTHROPLASTY;  Surgeon: [name], MD;  Location: [facility];  Service: Orthopedics;  Laterality: Left;  . Abdominal hysterectomy    . Total knee arthroplasty Right 05/02/2014    Procedure: TOTAL KNEE ARTHROPLASTY;  Surgeon: [name], MD;  Location: [facility];  Service: Orthopedics;  Laterality: Right;    FAMILY HISTORY: Family History  Problem Relation Age of Onset  . Heart disease Father   . Heart disease Mother   . Breast  cancer Sister   . Breast cancer Daughter   . Breast cancer Other     neice  . Colon polyps Other     nephew  . Colon cancer Neg Hx     SOCIAL HISTORY:  Social History   Social History  . Marital Status: Widowed    Spouse Name: N/A  . Number of Children: 4  . Years of Education: HS   Occupational History  . Retired    Social History Main Topics  . Smoking status: Never Smoker   . Smokeless tobacco: Never Used  . Alcohol Use: No  . Drug Use: No  . Sexual Activity: No   Other Topics Concern  . Not on file   Social History Narrative   Daily caffeine use 1-2 cup/day   Gets regular exercise   Right-handed.   Lives with a friend, Kathleen Morales.           PHYSICAL EXAM   Filed Vitals:   09/26/15 1603  BP: 132/68  Pulse: 71  Height: 5' 0.5" (1.537 m)  Weight: 138 lb (62.596 kg)    Not recorded      Body mass index is 26.5 kg/(m^2).  PHYSICAL EXAMNIATION:  Gen: NAD, conversant, well nourised, obese, well groomed                     Cardiovascular: Regular rate rhythm, no peripheral edema, warm, nontender. Eyes: Conjunctivae clear without exudates or hemorrhage Neck: Supple, no carotid bruise. Pulmonary: Clear to auscultation bilaterally   NEUROLOGICAL EXAM:  MENTAL STATUS: Speech:    Speech is normal; fluent and spontaneous with normal comprehension.  Cognition:     Orientation to time, place and person     Normal recent and remote memory     Normal Attention span and concentration     Normal Language, naming, repeating,spontaneous speech     Fund of knowledge   CRANIAL NERVES: CN II: Visual fields are full to confrontation. Fundoscopic exam is normal with sharp discs and no vascular changes. Pupils are round equal and briskly reactive to light. CN III, IV, VI: extraocular movement are normal. No ptosis. CN V: Facial sensation is intact to pinprick in all 3 divisions bilaterally. Corneal responses are intact.  CN VII: Face is symmetric with normal eye  closure and smile. CN VIII: Hearing is normal to rubbing fingers CN IX, X: Palate elevates symmetrically. Phonation is normal. CN XI: Head turning and shoulder shrug are intact CN XII: Tongue is midline with normal movements and no atrophy.  MOTOR: Limited range of motion of right shoulder, there is no significant bilateral upper extremity proximal or distal weakness,  there is no significant bilateral lower extremity proximal weakness, mild bilateral toe extension weakness  REFLEXES: Reflexes are 1 and symmetric at the biceps, triceps, absent at knees, and ankles. Plantar responses are flexor.  SENSORY: Mildly length dependent decreased to light touch, pinprick and vibratory sensation.  COORDINATION: Rapid alternating movements and fine finger movements are intact. There is no dysmetria on finger-to-nose and heel-knee-shin.    GAIT/STANCE: She needs to push up on the chair to get up from seated position, limp, unsteady, dragging both foot across the floor, she could not stand up on tiptoe on heels.  DIAGNOSTIC DATA (LABS, IMAGING, TESTING) - I reviewed patient records, labs, notes, testing and imaging myself where available.   ASSESSMENT AND PLAN  Kathleen Morales is a 82 y.o. female   Chronic low back pain, radiating pain to right leg,   Most consistent with right lumbar radiculopathy  Proceed with MRI of lumbar  EMG nerve conduction study  Gabapentin 100 mg 3 times a day  Gait difficulty  Multifactorial, joints pain, distal leg weakness, limited range of motion of right shoulder  She would benefit physical therapy  Marcial Pacas, M.D. Ph.D.  [facility] [address], [place], [place] [address] Ph: [phone] Fax: [phone]  CC: Bo Merino, MD. PCP [name], PA-C

## 2015-10-08 ENCOUNTER — Ambulatory Visit
Admission: RE | Admit: 2015-10-08 | Discharge: 2015-10-08 | Disposition: A | Discharge status: Home / Self Care | Payer: PPO | Source: Ambulatory Visit | Attending: Neurology | Admitting: Neurology

## 2015-10-08 ENCOUNTER — Telehealth: Payer: Self-pay | Admitting: Neurology

## 2015-10-08 DIAGNOSIS — M4806 Spinal stenosis, lumbar region: Secondary | ICD-10-CM | POA: Diagnosis not present

## 2015-10-08 DIAGNOSIS — M5416 Radiculopathy, lumbar region: Secondary | ICD-10-CM

## 2015-10-08 NOTE — Telephone Encounter (Signed)
Please call patient, MRI of the lumbar showed multilevel degenerative changes, most severe at L4-5, with severe right foraminal stenosis, probable right nerve roots compression, I will review imaging findings with her in detail at her next follow-up visit  IMPRESSION: This MRI of the lumbar spine shows multilevel degenerative changes as detailed above. The most significant findings are: 1. At L3-L4 there is minimal anterolisthesis of L3 upon L4 due to severe facet hypertrophy there is narrowing of the central canal with transverse diameter and moderate right lateral recess stenosis. Does not appear to be nerve root compression. 2. At L4-L5 there is lateral spondylolisthesis of L4 upon L5 leading to extensive protrusion of the uncovered disc on the right and facet hypertrophy. Severe right foraminal stenosis and moderately severe right lateral recess stenosis. There is probable right L4 possible right L5 nerve root compression. 3. At L5-S1 there are degenerative changes as a moderately severe left foraminal narrowing that could lead to left L5 nerve root compression. 4. There are milder degenerative changes at the other levels as described above.

## 2015-10-08 NOTE — Telephone Encounter (Signed)
Patient aware of results and she will keep her pending appts.

## 2015-10-15 DIAGNOSIS — J0100 Acute maxillary sinusitis, unspecified: Secondary | ICD-10-CM | POA: Diagnosis not present

## 2015-10-24 ENCOUNTER — Ambulatory Visit (INDEPENDENT_AMBULATORY_CARE_PROVIDER_SITE_OTHER): Payer: PPO | Admitting: Neurology

## 2015-10-24 DIAGNOSIS — R269 Unspecified abnormalities of gait and mobility: Secondary | ICD-10-CM | POA: Insufficient documentation

## 2015-10-24 DIAGNOSIS — M5416 Radiculopathy, lumbar region: Secondary | ICD-10-CM

## 2015-10-24 MED ORDER — GABAPENTIN 100 MG PO CAPS: 300.0000 mg | ORAL_CAPSULE | Freq: Three times a day (TID) | ORAL | Status: DC

## 2015-10-24 NOTE — Progress Notes (Signed)
PATIENT: Kathleen Morales DOB: 01-27-1929  No chief complaint on file.    HISTORICAL  Kathleen Morales is 82 years old right-handed female, company by her housemate Kathleen Morales, seen in refer by Dr.Shaili Deveshwar, for evaluation of gait difficulty, low back pain, radiating pain to bilateral lower extremity, her primary care is Kathleen Morales, [place].  She had a history of polymyalgia rheumatica since 2007, has been treated with low-dose prednisone, osteoarthritis, history of ulcerative colitis, bilateral knee replacement  She had chronic low back pain, progressive worsened since 2015, she noticed radiating pain down the right lateral thigh, numbness achiness at right lateral leg, she also had a gradual worsening gait difficulty, poor balance, she continue has multiple joints pain, despite bilateral knee replacement, she also complains of significant right shoulder pain, limited range of motion of her right shoulder, cracking sound of her neck when she turns around  She denies bowel and bladder incontinence, she has no bilateral hands paresthesia or weakness, she complains of bilateral plantar feet numbness tingling since 2016.  REVIEW OF SYSTEMS: Full 14 system review of systems performed and notable only for joint pain, swelling, achy muscles  ALLERGIES: Allergies  Allergen Reactions  . Xarelto [Rivaroxaban] Swelling    Legs turned black    HOME MEDICATIONS: Current Outpatient Prescriptions  Medication Sig Dispense Refill  . balsalazide (COLAZAL) 750 MG capsule Take 3 capsules (2,250 mg total) by mouth 3 (three) times daily. 270 capsule 11  . Biotin 1000 MCG tablet Take 1,000 mcg by mouth daily.    . Cholecalciferol (VITAMIN D3) 2000 UNITS TABS Take 2,000 Units by mouth daily.    Marland Kitchen estradiol (ESTRACE) 0.5 MG tablet Take 0.5 mg by mouth at bedtime.     . gabapentin (NEURONTIN) 100 MG capsule Take 1 capsule (100 mg total) by mouth 3 (three) times daily. 90 capsule 6  . Omega-3 Fatty  Acids (FISH OIL PO) Take by mouth daily.    . predniSONE (DELTASONE) 5 MG tablet Take 1 tablet (5 mg total) by mouth daily with breakfast. 30 tablet 1  . vitamin B-12 (CYANOCOBALAMIN) 1000 MCG tablet Take 1,000 mcg by mouth daily.     No current facility-administered medications for this visit.    PAST MEDICAL HISTORY: Past Medical History  Diagnosis Date  . Ulcerative colitis 1970    takes Colazal bid  . Diverticulosis   . Hemorrhoids   . Polymyalgia rheumatica ([place])   . History of [place] spotted fever     Elevated titer 04/2006  . 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 Hoarseness     pt states always like this;ENT can't find anything wrong  . Joint pain   . Joint swelling   . Bruises easily   . Gastric ulcer     hx of ;many yrs ago  . Hx of colonic polyps   . Nocturia   . Arthritis     takes Prednisone daily for inflammation  . Fibromyalgia   . Spine disorder     has had injections and wears a brace  . Leg pain     PAST SURGICAL HISTORY: Past Surgical History  Procedure Laterality Date  . Tubal ligation    . Cystectomy      Breast x 2-Right  . Partial hysterectomy      at age 27  . Epidural injections      x 2   . Carpal tunnel release      bilateral  .  Colonoscopy    . Cataract extraction, bilateral    . Total knee arthroplasty  04/06/2012    Procedure: TOTAL KNEE ARTHROPLASTY;  Surgeon: [name], MD;  Location: [facility];  Service: Orthopedics;  Laterality: Left;  . Abdominal hysterectomy    . Total knee arthroplasty Right 05/02/2014    Procedure: TOTAL KNEE ARTHROPLASTY;  Surgeon: [name], MD;  Location: [facility];  Service: Orthopedics;  Laterality: Right;    FAMILY HISTORY: Family History  Problem Relation Age of Onset  . Heart disease Father   . Heart disease Mother   . Breast cancer Sister   . Breast cancer Daughter   . Breast cancer Other     neice  . Colon polyps Other     nephew  . Colon cancer Neg Hx      SOCIAL HISTORY:  Social History   Social History  . Marital Status: Widowed    Spouse Name: N/A  . Number of Children: 4  . Years of Education: HS   Occupational History  . Retired    Social History Main Topics  . Smoking status: Never Smoker   . Smokeless tobacco: Never Used  . Alcohol Use: No  . Drug Use: No  . Sexual Activity: No   Other Topics Concern  . Not on file   Social History Narrative   Daily caffeine use 1-2 cup/day   Gets regular exercise   Right-handed.   Lives with a friend, Kathleen Morales.           PHYSICAL EXAM   There were no vitals filed for this visit.  Not recorded      There is no weight on file to calculate BMI.  PHYSICAL EXAMNIATION:  Gen: NAD, conversant, well nourised, obese, well groomed                     Cardiovascular: Regular rate rhythm, no peripheral edema, warm, nontender. Eyes: Conjunctivae clear without exudates or hemorrhage Neck: Supple, no carotid bruise. Pulmonary: Clear to auscultation bilaterally   NEUROLOGICAL EXAM:  MENTAL STATUS: Speech:    Speech is normal; fluent and spontaneous with normal comprehension.  Cognition:     Orientation to time, place and person     Normal recent and remote memory     Normal Attention span and concentration     Normal Language, naming, repeating,spontaneous speech     Fund of knowledge   CRANIAL NERVES: CN II: Visual fields are full to confrontation. Fundoscopic exam is normal with sharp discs and no vascular changes. Pupils are round equal and briskly reactive to light. CN III, IV, VI: extraocular movement are normal. No ptosis. CN V: Facial sensation is intact to pinprick in all 3 divisions bilaterally. Corneal responses are intact.  CN VII: Face is symmetric with normal eye closure and smile. CN VIII: Hearing is normal to rubbing fingers CN IX, X: Palate elevates symmetrically. Phonation is normal. CN XI: Head turning and shoulder shrug are intact CN XII: Tongue is  midline with normal movements and no atrophy.  MOTOR: Limited range of motion of right shoulder, there is no significant bilateral upper extremity proximal or distal weakness, there is no significant bilateral lower extremity proximal weakness, mild bilateral toe extension weakness  REFLEXES: Reflexes are 1 and symmetric at the biceps, triceps, absent at knees, and ankles. Plantar responses are flexor.  SENSORY: Mildly length dependent decreased to light touch, pinprick and vibratory sensation.  COORDINATION: Rapid alternating movements and  fine finger movements are intact. There is no dysmetria on finger-to-nose and heel-knee-shin.    GAIT/STANCE: She needs to push up on the chair to get up from seated position, limp, unsteady, dragging both foot across the floor, she could not stand up on tiptoe on heels.  DIAGNOSTIC DATA (LABS, IMAGING, TESTING) - I reviewed patient records, labs, notes, testing and imaging myself where available.   ASSESSMENT AND PLAN  Kathleen Morales is a 82 y.o. female   Chronic low back pain, radiating pain to right leg,   Most consistent with right lumbar radiculopathy  Proceed with MRI of lumbar  EMG nerve conduction study  Gabapentin 100 mg 3 times a day  Gait difficulty  Multifactorial, joints pain, distal leg weakness, limited range of motion of right shoulder  She would benefit physical therapy  Marcial Pacas, M.D. Ph.D.  [facility] [address], [place], [place] [address] Ph: [phone] Fax: [phone]  CC: Bo Merino, MD. PCP Kathleen Bender, PA-C

## 2015-10-24 NOTE — Progress Notes (Signed)
PATIENT: Kathleen Morales DOB: 1929/04/10  No chief complaint on file.    HISTORICAL  Kathleen Morales is 82 years old right-handed female, company by her housemate Kathleen Morales, seen in refer by Kathleen Morales, for evaluation of gait difficulty, low back pain, radiating pain to bilateral lower extremity, her primary care is Kathleen Morales, [place].  She had a history of polymyalgia rheumatica since 2007, has been treated with low-dose prednisone, osteoarthritis, history of ulcerative colitis, bilateral knee replacement  She had chronic low back pain, progressive worsened since 2015, she noticed radiating pain down the right lateral thigh, numbness achiness at right lateral leg, she also had a gradual worsening gait difficulty, poor balance, she continue has multiple joints pain, despite bilateral knee replacement, she also complains of significant right shoulder pain, limited range of motion of her right shoulder, cracking sound of her neck when she turns around  She denies bowel and bladder incontinence, she has no bilateral hands paresthesia or weakness, she complains of bilateral plantar feet numbness tingling since 2016.  Update October 24 2015: She returned for electrodiagnostic study today, which demonstrate mild bilateral lumbosacral radiculopathy, mainly involving bilateral L4, L5, S1 myotomes.  We also reviewed MRI of lumbar without contrast together in March 2017: multilevel degenerative changes as detailed above. The most significant findings are: 1. At L3-L4 there is minimal anterolisthesis of L3 upon L4 due to severe facet hypertrophy there is narrowing of the central canal with transverse diameter and moderate right lateral recess stenosis. Does not appear to be nerve root compression. 2. At L4-L5 there is lateral spondylolisthesis of L4 upon L5 leading to extensive protrusion of the uncovered disc on the right and facet hypertrophy. Severe right foraminal stenosis and moderately  severe right lateral recess stenosis. There is probable right L4 possible right L5 nerve root compression. 3. At L5-S1 there are degenerative changes as a moderately severe left foraminal narrowing that could lead to left L5 nerve root compression. 4. There are milder degenerative changes at the other levels as described above.  She has moderate to severe right-sided low back pain, radiating pain to right lower extremity, gabapentin 100 mg 3 times a day has been helpful, she reported 50% improvement of her pain, no significant side effect notice, she denies bowel and bladder incontinence, the most bothersome symptoms is her slow worsening gait difficulty.  REVIEW OF SYSTEMS: Full 14 system review of systems performed and notable only for joint pain, swelling, achy muscles  ALLERGIES: Allergies  Allergen Reactions  . Xarelto [Rivaroxaban] Swelling    Legs turned black    HOME MEDICATIONS: Current Outpatient Prescriptions  Medication Sig Dispense Refill  . balsalazide (COLAZAL) 750 MG capsule Take 3 capsules (2,250 mg total) by mouth 3 (three) times daily. 270 capsule 11  . Biotin 1000 MCG tablet Take 1,000 mcg by mouth daily.    . Cholecalciferol (VITAMIN D3) 2000 UNITS TABS Take 2,000 Units by mouth daily.    Marland Kitchen estradiol (ESTRACE) 0.5 MG tablet Take 0.5 mg by mouth at bedtime.     . gabapentin (NEURONTIN) 100 MG capsule Take 3 capsules (300 mg total) by mouth 3 (three) times daily. 270 capsule 11  . Omega-3 Fatty Acids (FISH OIL PO) Take by mouth daily.    . predniSONE (DELTASONE) 5 MG tablet Take 1 tablet (5 mg total) by mouth daily with breakfast. 30 tablet 1  . vitamin B-12 (CYANOCOBALAMIN) 1000 MCG tablet Take 1,000 mcg by mouth daily.  No current facility-administered medications for this visit.    PAST MEDICAL HISTORY: Past Medical History  Diagnosis Date  . Ulcerative colitis 1970    takes Colazal bid  . Diverticulosis   . Hemorrhoids   . Polymyalgia rheumatica  (Kathleen Morales)   . History of Kathleen Morales     Elevated titer 04/2006  . 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 Hoarseness     pt states always like this;ENT can't find anything wrong  . Joint pain   . Joint swelling   . Bruises easily   . Gastric ulcer     hx of ;many yrs ago  . Hx of colonic polyps   . Nocturia   . Arthritis     takes Prednisone daily for inflammation  . Fibromyalgia   . Spine disorder     has had injections and wears a brace  . Leg pain     PAST SURGICAL HISTORY: Past Surgical History  Procedure Laterality Date  . Tubal ligation    . Cystectomy      Breast x 2-Right  . Partial hysterectomy      at age 43  . Epidural injections      x 2   . Carpal tunnel release      bilateral  . Colonoscopy    . Cataract extraction, bilateral    . Total knee arthroplasty  04/06/2012    Procedure: TOTAL KNEE ARTHROPLASTY;  Surgeon: [name], MD;  Location: [place];  Service: Orthopedics;  Laterality: Left;  . Abdominal hysterectomy    . Total knee arthroplasty Right 05/02/2014    Procedure: TOTAL KNEE ARTHROPLASTY;  Surgeon: [name], MD;  Location: [facility];  Service: Orthopedics;  Laterality: Right;    FAMILY HISTORY: Family History  Problem Relation Age of Onset  . Heart disease Father   . Heart disease Mother   . Breast cancer Sister   . Breast cancer Daughter   . Breast cancer Other     neice  . Colon polyps Other     nephew  . Colon cancer Neg Hx     SOCIAL HISTORY:  Social History   Social History  . Marital Status: Widowed    Spouse Name: N/A  . Number of Children: 4  . Years of Education: HS   Occupational History  . Retired    Social History Main Topics  . Smoking status: Never Smoker   . Smokeless tobacco: Never Used  . Alcohol Use: No  . Drug Use: No  . Sexual Activity: No   Other Topics Concern  . Not on file   Social History Narrative   Daily caffeine use 1-2 cup/day   Gets regular exercise    Right-handed.   Lives with a friend, Kathleen Morales.           PHYSICAL EXAM   There were no vitals filed for this visit.  Not recorded      There is no weight on file to calculate BMI.  PHYSICAL EXAMNIATION:  Gen: NAD, conversant, well nourised, obese, well groomed                     Cardiovascular: Regular rate rhythm, no peripheral edema, warm, nontender. Eyes: Conjunctivae clear without exudates or hemorrhage Neck: Supple, no carotid bruise. Pulmonary: Clear to auscultation bilaterally   NEUROLOGICAL EXAM:  MENTAL STATUS: Speech:    Speech is normal; fluent and spontaneous with normal comprehension.  Cognition:  Orientation to time, place and person     Normal recent and remote memory     Normal Attention span and concentration     Normal Language, naming, repeating,spontaneous speech     Fund of knowledge   CRANIAL NERVES: CN II: Visual fields are full to confrontation. Fundoscopic exam is normal with sharp discs and no vascular changes. Pupils are round equal and briskly reactive to light. CN III, IV, VI: extraocular movement are normal. No ptosis. CN V: Facial sensation is intact to pinprick in all 3 divisions bilaterally. Corneal responses are intact.  CN VII: Face is symmetric with normal eye closure and smile. CN VIII: Hearing is normal to rubbing fingers CN IX, X: Palate elevates symmetrically. Phonation is normal. CN XI: Head turning and shoulder shrug are intact CN XII: Tongue is midline with normal movements and no atrophy.  MOTOR: Limited range of motion of right shoulder, there is no significant bilateral upper extremity proximal or distal weakness, there is no significant bilateral lower extremity proximal weakness, With exception of mild bilateral toe extension weakness  REFLEXES: Reflexes are 1 and symmetric at the biceps, triceps, absent at knees, and ankles. Plantar responses are flexor.  SENSORY: Mildly length dependent decreased to light touch,  pinprick and vibratory sensation.  COORDINATION: Rapid alternating movements and fine finger movements are intact. There is no dysmetria on finger-to-nose and heel-knee-shin.    GAIT/STANCE: She needs to push up on the chair to get up from seated position, limp, unsteady, dragging both foot across the floor, she could not stand up on tiptoe on heels.  DIAGNOSTIC DATA (LABS, IMAGING, TESTING) - I reviewed patient records, labs, notes, testing and imaging myself where available.   ASSESSMENT AND PLAN  SHARNEE DOUGLASS is a 82 y.o. female   Chronic low back pain, radiating pain to right leg,   Significant multilevel lumbar degenerative disc disease, with variable degree of foraminal stenosis at different levels, most severe is lateral spondylolisthesis of L4 on L5, leading to extensive protrusion of the uncovered disc on the right and facet hypertrophy, severe right foraminal stenosis, with possible right L4, L5 nerve root compression.  I have increased her gabapentin to 100 mg 3 tablets 3 times a day  Gait difficulty  Multifactorial, joints pain, distal leg weakness, limited range of motion of right shoulder  Referred her to physical therapy   Marcial Pacas, M.D. Ph.D.  [facility] [address] [place], [place] [address] Ph: [phone] Fax: [phone]  CC: Kathleen Merino, MD. PCP Kathleen Bender, PA-C

## 2015-10-24 NOTE — Procedures (Signed)
   NCS (NERVE CONDUCTION STUDY) WITH EMG (ELECTROMYOGRAPHY) REPORT   STUDY DATE: October 24 2015 PATIENT NAME: Kathleen Morales DOB: 04/19/1929 MRN: [id number]    TECHNOLOGIST: Laretta Alstrom ELECTROMYOGRAPHER: Marcial Pacas M.D.  CLINICAL INFORMATION:  82 year old female with history of chronic low back pain, scoliosis, presenting with worsening right-sided low back pain, radiating pain to right lateral leg  On examination: She has mild bilateral toe extension weakness, mild length dependent sensory changes, decreased bilateral ankle reflexes.  FINDINGS: NERVE CONDUCTION STUDY: Bilateral peroneal sensory responses were normal. Bilateral tibial motor responses were normal. Bilateral tibial H reflexes were present and symmetric. Bilateral peroneal to EDB motor responses showed only decreased C map amplitude, with preserved distal latency and conduction velocity.  NEEDLE ELECTROMYOGRAPHY: Selective needle examinations were performed at bilateral lower extremity muscles, bilateral lumbar sacral paraspinal muscles.  Bilateral tibialis anterior, tibialis posterior, medial gastrocnemius: Increased insertional activity, no spontaneous activity, mildly enlarged motor unit potential, with mildly decreased recruitment patterns.  Right vastus lateralis: Increased insertional activity, 1 plus positive waves, mildly enlarged motor unit potential with mildly decreased recruitment patterns.  Needle examination of left vastus lateralis was normal  Right gluteus medius: Increased insertional activity, no spontaneous activity, mixture of normal some enlarged motor unit potentials mildly decreased recruitment patterns.  Bilateral biceps femoris short head: Normal insertion activity, no spontaneous activity, mildly enlarged motor unit potential, with mildly decreased recruitment patterns.  She has increased insertional activity at bilateral lumbar sacral paraspinal muscles, bilateral L4, L5, S1, with 1 plus  positive waves at right L5-S1.  IMPRESSION:   This is an abnormal study, there is electrodiagnostic evidence of mild bilateral lumbosacral radiculopathies, mainly involving bilateral L4, L5, S1 myotomes.  INTERPRETING PHYSICIAN:   Marcial Pacas M.D. Ph.D. [facility] [address] [place], [place] [address] [phone]

## 2015-10-31 DIAGNOSIS — M545 Low back pain: Secondary | ICD-10-CM | POA: Diagnosis not present

## 2015-10-31 DIAGNOSIS — M5489 Other dorsalgia: Secondary | ICD-10-CM | POA: Diagnosis not present

## 2015-11-05 DIAGNOSIS — M5489 Other dorsalgia: Secondary | ICD-10-CM | POA: Diagnosis not present

## 2015-11-05 DIAGNOSIS — M545 Low back pain: Secondary | ICD-10-CM | POA: Diagnosis not present

## 2015-11-06 DIAGNOSIS — M25511 Pain in right shoulder: Secondary | ICD-10-CM | POA: Diagnosis not present

## 2015-11-06 DIAGNOSIS — Z79899 Other long term (current) drug therapy: Secondary | ICD-10-CM | POA: Diagnosis not present

## 2015-11-06 DIAGNOSIS — E559 Vitamin D deficiency, unspecified: Secondary | ICD-10-CM | POA: Diagnosis not present

## 2015-11-06 DIAGNOSIS — M2550 Pain in unspecified joint: Secondary | ICD-10-CM | POA: Diagnosis not present

## 2015-11-06 DIAGNOSIS — R5381 Other malaise: Secondary | ICD-10-CM | POA: Diagnosis not present

## 2015-11-06 DIAGNOSIS — M810 Age-related osteoporosis without current pathological fracture: Secondary | ICD-10-CM | POA: Diagnosis not present

## 2015-11-06 DIAGNOSIS — Z09 Encounter for follow-up examination after completed treatment for conditions other than malignant neoplasm: Secondary | ICD-10-CM | POA: Diagnosis not present

## 2015-11-06 DIAGNOSIS — M353 Polymyalgia rheumatica: Secondary | ICD-10-CM | POA: Diagnosis not present

## 2015-11-08 DIAGNOSIS — M5489 Other dorsalgia: Secondary | ICD-10-CM | POA: Diagnosis not present

## 2015-11-08 DIAGNOSIS — M545 Low back pain: Secondary | ICD-10-CM | POA: Diagnosis not present

## 2015-11-12 DIAGNOSIS — M5489 Other dorsalgia: Secondary | ICD-10-CM | POA: Diagnosis not present

## 2015-11-12 DIAGNOSIS — M545 Low back pain: Secondary | ICD-10-CM | POA: Diagnosis not present

## 2015-11-15 DIAGNOSIS — M545 Low back pain: Secondary | ICD-10-CM | POA: Diagnosis not present

## 2015-11-15 DIAGNOSIS — M5489 Other dorsalgia: Secondary | ICD-10-CM | POA: Diagnosis not present

## 2015-11-22 DIAGNOSIS — M5489 Other dorsalgia: Secondary | ICD-10-CM | POA: Diagnosis not present

## 2015-11-22 DIAGNOSIS — M545 Low back pain: Secondary | ICD-10-CM | POA: Diagnosis not present

## 2015-11-28 DIAGNOSIS — M545 Low back pain: Secondary | ICD-10-CM | POA: Diagnosis not present

## 2015-11-28 DIAGNOSIS — M5489 Other dorsalgia: Secondary | ICD-10-CM | POA: Diagnosis not present

## 2015-12-05 DIAGNOSIS — M545 Low back pain: Secondary | ICD-10-CM | POA: Diagnosis not present

## 2015-12-05 DIAGNOSIS — M5489 Other dorsalgia: Secondary | ICD-10-CM | POA: Diagnosis not present

## 2015-12-12 DIAGNOSIS — M545 Low back pain: Secondary | ICD-10-CM | POA: Diagnosis not present

## 2015-12-12 DIAGNOSIS — M5489 Other dorsalgia: Secondary | ICD-10-CM | POA: Diagnosis not present

## 2015-12-20 DIAGNOSIS — Z79899 Other long term (current) drug therapy: Secondary | ICD-10-CM | POA: Diagnosis not present

## 2015-12-20 DIAGNOSIS — Z139 Encounter for screening, unspecified: Secondary | ICD-10-CM | POA: Diagnosis not present

## 2015-12-20 DIAGNOSIS — Z9181 History of falling: Secondary | ICD-10-CM | POA: Diagnosis not present

## 2015-12-20 DIAGNOSIS — E559 Vitamin D deficiency, unspecified: Secondary | ICD-10-CM | POA: Diagnosis not present

## 2015-12-20 DIAGNOSIS — M159 Polyosteoarthritis, unspecified: Secondary | ICD-10-CM | POA: Diagnosis not present

## 2015-12-20 DIAGNOSIS — Z6824 Body mass index (BMI) 24.0-24.9, adult: Secondary | ICD-10-CM | POA: Diagnosis not present

## 2015-12-20 DIAGNOSIS — K5190 Ulcerative colitis, unspecified, without complications: Secondary | ICD-10-CM | POA: Diagnosis not present

## 2015-12-20 DIAGNOSIS — E7800 Pure hypercholesterolemia, unspecified: Secondary | ICD-10-CM | POA: Diagnosis not present

## 2015-12-20 DIAGNOSIS — E538 Deficiency of other specified B group vitamins: Secondary | ICD-10-CM | POA: Diagnosis not present

## 2015-12-20 DIAGNOSIS — I8393 Asymptomatic varicose veins of bilateral lower extremities: Secondary | ICD-10-CM | POA: Diagnosis not present

## 2016-02-11 DIAGNOSIS — H903 Sensorineural hearing loss, bilateral: Secondary | ICD-10-CM | POA: Diagnosis not present

## 2016-02-18 ENCOUNTER — Telehealth: Payer: Self-pay | Admitting: Gastroenterology

## 2016-02-18 NOTE — Telephone Encounter (Signed)
1 month history of bloody mucus and urgency.  She has a history of UC.  Last office visit was 03/2015.  She is maintained on colazal 3 tid and takes 5 mg of prednisone daily, but this is for her arthritis.  She will come in and see Ellouise Newer, PA on 02/20/16 2:30

## 2016-02-20 ENCOUNTER — Ambulatory Visit (INDEPENDENT_AMBULATORY_CARE_PROVIDER_SITE_OTHER): Payer: PPO | Admitting: Physician Assistant

## 2016-02-20 ENCOUNTER — Encounter: Payer: Self-pay | Admitting: Physician Assistant

## 2016-02-20 ENCOUNTER — Other Ambulatory Visit (INDEPENDENT_AMBULATORY_CARE_PROVIDER_SITE_OTHER): Payer: PPO

## 2016-02-20 VITALS — BP 142/74 | HR 68 | Ht 60.5 in | Wt 138.1 lb

## 2016-02-20 DIAGNOSIS — M25511 Pain in right shoulder: Secondary | ICD-10-CM | POA: Diagnosis not present

## 2016-02-20 DIAGNOSIS — M0760 Enteropathic arthropathies, unspecified site: Secondary | ICD-10-CM

## 2016-02-20 DIAGNOSIS — K51919 Ulcerative colitis, unspecified with unspecified complications: Secondary | ICD-10-CM

## 2016-02-20 LAB — CBC WITH DIFFERENTIAL/PLATELET
BASOS ABS: 0 10*3/uL (ref 0.0–0.1)
Basophils Relative: 0.3 % (ref 0.0–3.0)
EOS ABS: 0.1 10*3/uL (ref 0.0–0.7)
Eosinophils Relative: 0.9 % (ref 0.0–5.0)
HCT: 43.4 % (ref 36.0–46.0)
HEMOGLOBIN: 14.6 g/dL (ref 12.0–15.0)
LYMPHS ABS: 2.4 10*3/uL (ref 0.7–4.0)
Lymphocytes Relative: 24.3 % (ref 12.0–46.0)
MCHC: 33.5 g/dL (ref 30.0–36.0)
MCV: 97.2 fl (ref 78.0–100.0)
MONO ABS: 0.6 10*3/uL (ref 0.1–1.0)
Monocytes Relative: 6 % (ref 3.0–12.0)
NEUTROS PCT: 68.5 % (ref 43.0–77.0)
Neutro Abs: 6.8 10*3/uL (ref 1.4–7.7)
Platelets: 344 10*3/uL (ref 150.0–400.0)
RBC: 4.46 Mil/uL (ref 3.87–5.11)
RDW: 13.6 % (ref 11.5–15.5)
WBC: 10 10*3/uL (ref 4.0–10.5)

## 2016-02-20 LAB — COMPREHENSIVE METABOLIC PANEL
ALBUMIN: 4.2 g/dL (ref 3.5–5.2)
ALK PHOS: 67 U/L (ref 39–117)
ALT: 11 U/L (ref 0–35)
AST: 20 U/L (ref 0–37)
BILIRUBIN TOTAL: 0.6 mg/dL (ref 0.2–1.2)
BUN: 14 mg/dL (ref 6–23)
CO2: 31 mEq/L (ref 19–32)
Calcium: 9.8 mg/dL (ref 8.4–10.5)
Chloride: 102 mEq/L (ref 96–112)
Creatinine, Ser: 0.68 mg/dL (ref 0.40–1.20)
GFR: 87.29 mL/min (ref >60.00)
GLUCOSE: 103 mg/dL — AB (ref 70–99)
POTASSIUM: 4.6 meq/L (ref 3.5–5.1)
SODIUM: 138 meq/L (ref 135–145)
TOTAL PROTEIN: 7.2 g/dL (ref 6.0–8.3)

## 2016-02-20 MED ORDER — PREDNISONE 10 MG PO TABS: 10.0000 mg | ORAL_TABLET | Freq: Every day | ORAL | 0 refills | Status: DC

## 2016-02-20 NOTE — Progress Notes (Signed)
Chief Complaint: Ulcerative colitis flare  HPI: Kathleen Morales is an 82 year old Caucasian female with a past medical history of ulcerative colitis, who presents to clinic today with complaint of a "flare of my UC". Independent review of patient's last colonoscopy report and images from 08/28/10 by Dr. Fuller Plan shows colitis found in the rectum and sigmoid colon and mild diverticulosis in the sigmoid to descending colon.   Today, the patient tells me that she was diagnosed with ulcerative colitis 55 years ago and has been well maintained on Balsalazide 750 mg 3 tabs by mouth 3 times a day. She has had the occasional flare which has always been treated with either steroids or suppositories. Today, she tells me that for over a month now she has been experiencing some mucus and bright red blood per rectum. She tells me that she typically has one solid bowel movement per day and then will proceed to have incontinence of a "mucous material". She tells me sometimes this is "clear other times bloody and other times mattery". This occurs so often that she has to wear a pad on a daily basis for leakage. The patient denies diarrhea. This has been accompanied by a lower abdominal cramping which goes away after a bowel movement or passing of mucous.  Patient denies fever, chills, weight loss, fatigue, anorexia, nausea, vomiting, heartburn, reflux or symptoms that awaken her at night.   Past Medical History:  Diagnosis Date  . Arthritis    takes Prednisone daily for inflammation  . Bruises easily   . Diverticulosis   . Fibromyalgia   . Gastric ulcer    hx of ;many yrs ago  . Hemorrhoids   . History of Holly spotted fever    Elevated titer 04/2006  . Hoarseness    pt states always like this;ENT can't find anything wrong  . Hx of colonic polyps   . Joint pain   . Joint swelling   . Leg pain   . Nocturia   . Polymyalgia rheumatica ([place])   . Spine disorder    has had injections and wears a  brace  . 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 Ulcerative colitis 1970   takes Colazal bid    Past Surgical History:  Procedure Laterality Date  . ABDOMINAL HYSTERECTOMY    . CARPAL TUNNEL RELEASE     bilateral  . CATARACT EXTRACTION, BILATERAL    . COLONOSCOPY    . CYSTECTOMY     Breast x 2-Right  . epidural injections     x 2   . PARTIAL HYSTERECTOMY     at age 50  . TOTAL KNEE ARTHROPLASTY  04/06/2012   Procedure: TOTAL KNEE ARTHROPLASTY;  Surgeon: [name], MD;  Location: [place];  Service: Orthopedics;  Laterality: Left;  . TOTAL KNEE ARTHROPLASTY Right 05/02/2014   Procedure: TOTAL KNEE ARTHROPLASTY;  Surgeon: [name], MD;  Location: [place];  Service: Orthopedics;  Laterality: Right;  . TUBAL LIGATION      Current Outpatient Prescriptions  Medication Sig Dispense Refill  . balsalazide (COLAZAL) 750 MG capsule Take 3 capsules (2,250 mg total) by mouth 3 (three) times daily. 270 capsule 11  . Biotin 1000 MCG tablet Take 1,000 mcg by mouth daily.    . Cholecalciferol (VITAMIN D3) 2000 UNITS TABS Take 2,000 Units by mouth daily.    Marland Kitchen estradiol (ESTRACE) 0.5 MG tablet Take 0.5 mg by mouth at bedtime.     . gabapentin (NEURONTIN) 100  MG capsule Take 3 capsules (300 mg total) by mouth 3 (three) times daily. 270 capsule 11  . Omega-3 Fatty Acids (FISH OIL PO) Take by mouth daily.    . predniSONE (DELTASONE) 5 MG tablet Take 1 tablet (5 mg total) by mouth daily with breakfast. 30 tablet 1  . vitamin B-12 (CYANOCOBALAMIN) 1000 MCG tablet Take 1,000 mcg by mouth daily.    . predniSONE (DELTASONE) 10 MG tablet Take 1 tablet (10 mg total) by mouth daily. Tapered dose. Take as directed. 100 tablet 0   No current facility-administered medications for this visit.     Allergies as of 02/20/2016 - Review Complete 02/20/2016  Allergen Reaction Noted  . Xarelto [rivaroxaban] Swelling 04/25/2015    Family History  Problem Relation Age of Onset  . Heart disease  Mother   . Heart disease Father   . Breast cancer Sister   . Breast cancer Daughter   . Breast cancer Other     neice  . Colon polyps Other     nephew  . Colon cancer Neg Hx     Social History   Social History  . Marital status: Widowed    Spouse name: N/A  . Number of children: 4  . Years of education: HS   Occupational History  . Retired Retired   Social History Main Topics  . Smoking status: Never Smoker  . Smokeless tobacco: Never Used  . Alcohol use No  . Drug use: No  . Sexual activity: No   Other Topics Concern  . Not on file   Social History Narrative   Daily caffeine use 1-2 cup/day   Gets regular exercise   Right-handed.   Lives with a friend, Kathleen Morales.          Review of Systems:    Constitutional: No weight loss, fever, chills, weakness or fatigue HEENT: Eyes: No Change in vision               Ears, Nose, Throat:  No change in hearing Skin: No rash or itching Cardiovascular: No chest pain, chest pressure or palpitations    Respiratory: No SOB or cough Gastrointestinal: See HPI and otherwise negative Genitourinary: No dysuria or change in urinary frequency Neurological: No headache or dizziness Musculoskeletal: No new muscle or back pain Hematologic: Positive for bloody mucus from rectum Psychiatric: No history of depression or anxiety   Physical Exam:  Vital signs: BP (!) 142/74 (BP Location: Left Arm, Patient Position: Sitting, Cuff Size: Normal)   Pulse 68   Ht 5' 0.5" (1.537 m)   Wt 138 lb 2 oz (62.7 kg)   BMI 26.53 kg/m   General:   Pleasant Elderly Caucasian female appears to be in NAD, Well developed, Well nourished, alert and cooperative Head:  Normocephalic and atraumatic. Eyes:   PEERL, EOMI. No icterus. Conjunctiva pink. Ears:  Normal auditory acuity. Neck:  Supple Throat: Oral cavity and pharynx without inflammation, swelling or lesion. Lungs: Respirations even and unlabored. Lungs clear to auscultation bilaterally.   No  wheezes, crackles, or rhonchi.  Heart: Normal S1, S2. No MRG. Regular rate and rhythm. No peripheral edema, cyanosis or pallor.  Abdomen:  Soft, nondistended, moderate TTP and left lower quadrant and up into the left side of abdomen No rebound or guarding. Normal bowel sounds. No appreciable masses or hepatomegaly. Rectal:  Not performed.  Msk:  Symmetrical without gross deformities. Peripheral pulses intact.  Extremities:  Without edema, no deformity or joint abnormality.  Neurologic:  Alert and  oriented x4;  grossly normal neurologically.  Skin:   Dry and intact without significant lesions or rashes. Psychiatric: Oriented to person, place and time. Demonstrates good judgement and reason without abnormal affect or behaviors.  RELEVANT LABS AND IMAGING: CBC    Component Value Date/Time   WBC 9.8 04/25/2015 1626   RBC 4.03 04/25/2015 1626   HGB 13.2 04/25/2015 1626   HGB 14.4 09/28/2006 1017   HCT 39.5 04/25/2015 1626   HCT 41.8 09/28/2006 1017   PLT 311.0 04/25/2015 1626   PLT 336 09/28/2006 1017   MCV 98.1 04/25/2015 1626   MCV 94.0 09/28/2006 1017   MCH 32.8 05/04/2014 0540   MCHC 33.5 04/25/2015 1626   RDW 13.4 04/25/2015 1626   RDW 15.3 (H) 09/28/2006 1017   LYMPHSABS 2.2 04/25/2015 1626   LYMPHSABS 1.9 09/28/2006 1017   MONOABS 0.7 04/25/2015 1626   MONOABS 0.5 09/28/2006 1017   EOSABS 0.1 04/25/2015 1626   EOSABS 0.1 09/28/2006 1017   BASOSABS 0.0 04/25/2015 1626   BASOSABS 0.0 09/28/2006 1017    CMP     Component Value Date/Time   NA 143 04/25/2015 1626   K 4.1 04/25/2015 1626   CL 107 04/25/2015 1626   CO2 32 04/25/2015 1626   GLUCOSE 99 04/25/2015 1626   BUN 10 09/11/2015 1027   CREATININE 0.71 09/11/2015 1027   CALCIUM 9.1 04/25/2015 1626   PROT 6.1 04/25/2015 1626   ALBUMIN 3.7 04/25/2015 1626   AST 19 04/25/2015 1626   ALT 11 04/25/2015 1626   ALKPHOS 59 04/25/2015 1626   BILITOT 0.3 04/25/2015 1626   GFRNONAA 79 (L) 05/04/2014 0540   GFRAA >90  05/04/2014 0540    Assessment: 1. Ulcerative colitis: 55 year history controlled on balsalazide 750 mg 3 by mouth 3 times a day, 1+ month history of bloody mucus associated with lower abdominal cramping, patient describes still having solid stools, but does have incontinence of mucus, needing to wear a pad on a daily basis; this does sound more like proctitis, will start patient on prednisone taper starting at 30 mg and also recommend Canasa suppositories for one month.  Plan: 1.Prescribed Canasa suppositories once daily before bedtime for 1 month 2. Prescribed prednisone taper starting at 30 mg for 2 weeks, then decrease by 5 mg per week. Patient was told to holdHer regular 5 mg steroid dose for now and restart when she finishes taper. 3. Continue balsalazide 3 tabs 3 times a day 4. Ordered CMP and CBC for medication monitoring 5. Recommend the patient follow with Dr. Fuller Plan in 6-8 weeks. If patient continues with problems could consider repeating colonoscopy, though due to advanced age, we will hold for now and treat conservatively.  Ellouise Newer, PA-C [facility] 02/20/2016, 2:52 PM  Cc: [name], PA-C

## 2016-02-20 NOTE — Patient Instructions (Addendum)
Please go to the basement level to have your labs drawn.   Continue Balsalazide - 3 tablets 3 times daily. We have given you samples of Canasa suppositories. Use 1 suppository at night at bedtime.  Take Prednisone 30 mg ( 3 tablets)  x 14 days. Then decrease by 5 mg every week.  25 mg ( 2.5 tab)  7 days. 20 mg ( 2 tab)x 7 days  15 mg ( 1.5 )x 7 days  10 mg ( 1 tab) x 7 days   Then stay on 1 ( 5 mg ) tablet daily.

## 2016-02-20 NOTE — Progress Notes (Signed)
Reviewed and agree with initial management plan.  Malcolm T. Stark, MD FACG 

## 2016-02-28 ENCOUNTER — Telehealth: Payer: Self-pay | Admitting: Neurology

## 2016-02-28 MED ORDER — GABAPENTIN 100 MG PO CAPS: 200.0000 mg | ORAL_CAPSULE | Freq: Three times a day (TID) | ORAL | 5 refills | Status: DC

## 2016-02-28 NOTE — Addendum Note (Signed)
Addended by: Monte Fantasia on: 02/28/2016 03:59 PM   Modules accepted: Orders

## 2016-02-28 NOTE — Addendum Note (Signed)
Addended by: Margette Fast on: 02/28/2016 05:42 PM   Modules accepted: Orders

## 2016-02-28 NOTE — Telephone Encounter (Signed)
I have sent in the prescription for the gabapentin.

## 2016-02-28 NOTE — Telephone Encounter (Signed)
Patient is calling to get a new Rx for gabapentin (NEURONTIN) 100 MG capsule called to [facility] in [place]. The patient now takes 2 pills 3x a day which Dr. Krista Blue said that was ok.

## 2016-03-03 DIAGNOSIS — M25511 Pain in right shoulder: Secondary | ICD-10-CM | POA: Diagnosis not present

## 2016-04-07 ENCOUNTER — Other Ambulatory Visit (HOSPITAL_COMMUNITY): Payer: Self-pay | Admitting: Rheumatology

## 2016-04-07 ENCOUNTER — Ambulatory Visit (HOSPITAL_COMMUNITY)
Admission: RE | Admit: 2016-04-07 | Discharge: 2016-04-07 | Disposition: A | Discharge status: Home / Self Care | Payer: PPO | Source: Ambulatory Visit | Attending: Physician Assistant | Admitting: Physician Assistant

## 2016-04-07 DIAGNOSIS — M7989 Other specified soft tissue disorders: Secondary | ICD-10-CM | POA: Insufficient documentation

## 2016-04-07 DIAGNOSIS — M79602 Pain in left arm: Secondary | ICD-10-CM | POA: Insufficient documentation

## 2016-04-07 DIAGNOSIS — M353 Polymyalgia rheumatica: Secondary | ICD-10-CM | POA: Diagnosis not present

## 2016-04-07 DIAGNOSIS — M79605 Pain in left leg: Secondary | ICD-10-CM | POA: Insufficient documentation

## 2016-04-07 DIAGNOSIS — Z09 Encounter for follow-up examination after completed treatment for conditions other than malignant neoplasm: Secondary | ICD-10-CM | POA: Diagnosis not present

## 2016-04-07 NOTE — Progress Notes (Signed)
**  Preliminary report by tech**  Left lower extremity venous duplex completed There is no evidence of deep or superficial vein thrombosis involving the left lower extremity. All visualized vessels appear patent and compressible. There is no evidence of a Baker's cyst on the left.  04/07/16 4:31 PM Kathleen Morales RVT

## 2016-04-08 ENCOUNTER — Telehealth: Payer: Self-pay | Admitting: Radiology

## 2016-04-08 NOTE — Telephone Encounter (Signed)
Called patient/ normal doppler study

## 2016-05-01 ENCOUNTER — Telehealth: Payer: Self-pay | Admitting: Gastroenterology

## 2016-05-01 MED ORDER — BUDESONIDE 3 MG PO CPEP
9.0000 mg | ORAL_CAPSULE | Freq: Every day | ORAL | 1 refills | Status: DC
Start: 2016-05-01 — End: 2016-07-07

## 2016-05-01 NOTE — Telephone Encounter (Signed)
Patient notified of the recommendations.  Follow up scheduled for 06/26/16

## 2016-05-01 NOTE — Telephone Encounter (Signed)
Budesonide 9 mg daily or 10 mg daily for 8 weeks Schedule REV at about 8 weeks if not already scheduled If she cannot obtain budesonide repeat the Prednisone taper as recently prescribed

## 2016-05-01 NOTE — Telephone Encounter (Signed)
Patient reports that she completed her prednisone and now her symptoms of blood and mucus have returned. Blood and mucus had resolved while on prednisone.  She reports that Canasa does not seem to be helping.  She is on colozal 4 TID.  Dr. Fuller Plan please advise

## 2016-05-02 ENCOUNTER — Other Ambulatory Visit: Payer: Self-pay | Admitting: Gastroenterology

## 2016-05-02 DIAGNOSIS — K51919 Ulcerative colitis, unspecified with unspecified complications: Secondary | ICD-10-CM

## 2016-05-19 ENCOUNTER — Telehealth: Payer: Self-pay | Admitting: Gastroenterology

## 2016-05-19 MED ORDER — PREDNISONE 10 MG PO TABS: ORAL_TABLET | ORAL | 0 refills | Status: DC

## 2016-05-19 NOTE — Telephone Encounter (Signed)
Patient has been on budesonide 9 mg since 05/01/16. She reports that she is still having mucus and bloody stools.  She reports that she may have 2 or 3 days with no drainage or bloody mucus then have 2-3 days with excessive drainage.  Saturday she was incontinent and had to wear a pad due to excess drainage.  Dr. Fuller Plan please advise

## 2016-05-19 NOTE — Telephone Encounter (Signed)
GI pathogen panel  Prednisone taper starting at 30 mg for 2 weeks, then decrease by 5 mg per week. Resume budesonide when she reaches prednisone 20 mg per day  REV with me or APP in 3-4 weeks

## 2016-05-23 DIAGNOSIS — Z961 Presence of intraocular lens: Secondary | ICD-10-CM | POA: Diagnosis not present

## 2016-05-23 DIAGNOSIS — H52203 Unspecified astigmatism, bilateral: Secondary | ICD-10-CM | POA: Diagnosis not present

## 2016-06-10 ENCOUNTER — Encounter (INDEPENDENT_AMBULATORY_CARE_PROVIDER_SITE_OTHER): Payer: Self-pay

## 2016-06-10 ENCOUNTER — Encounter: Payer: Self-pay | Admitting: Nurse Practitioner

## 2016-06-10 ENCOUNTER — Ambulatory Visit (INDEPENDENT_AMBULATORY_CARE_PROVIDER_SITE_OTHER): Payer: PPO | Admitting: Nurse Practitioner

## 2016-06-10 VITALS — BP 130/74 | HR 68 | Ht 65.0 in | Wt 138.0 lb

## 2016-06-10 DIAGNOSIS — K5130 Ulcerative (chronic) rectosigmoiditis without complications: Secondary | ICD-10-CM | POA: Diagnosis not present

## 2016-06-10 NOTE — Patient Instructions (Addendum)
Taper down to 15 mg of Prednisone   Restart Entocort 3 tabs every am  Continue Colazal as prescribed   If symptoms worsen call the office to get a sooner appointment   Otherwise follow up in 6 months with Dr Fuller Plan

## 2016-06-10 NOTE — Progress Notes (Addendum)
     HPI: Patient is an 82 year old female known to Dr. [place] Plan. She has a history of adenomatous colon polyps and left sided UC maintained on Colazal.. Patient has polymyalgia rheumatica, she is on chronic low dose prednisone. Patient was seen late July with UC flare symptoms. We gave her a tapering dose of prednisone + canasa suppositories. Didn't feel like canasa supp were helpful but symptoms did finally improve with prednisone. After completion of Prednisone taper the bloody mucous returned. She called the office early Oct and was started on Entocort for 8 weeks. Still symptomatic late October so ordered stool pathogen panel (? results), held entocort, put her on Prednisone 32m to be tapered by 561mper week.She was instructed to resume entocort when prednisone was down to 2042maily which was yesterday. Patient is here for follow up.   Stools soft, 1-2 BMs a day. No blood in 10 days but still has some mucous on stools, especially in the am. No abdominal pain.  Last colonoscopy in 2012 with findings of colitis in sigmoid and rectum. Biopsies revealed mildly active chronic colitis. Remaining colon with biopsies was negative.    Patient's surgical history, family medical history, social history, medications and allergies were all reviewed in Epic   Physical Exam: BP 130/74   Pulse 68   Ht 5' 5"  (1.651 m)   Wt 138 lb (62.6 kg)   BMI 22.96 kg/m   GENERAL: well developed white female in NAD. Here with husband PSYCH: :Pleasant, cooperative, normal affect HEENT: Normocephalic, conjunctiva pink, mucous membranes moist, neck supple without masses CARDIAC:  RRR, no peripheral edema PULM: Normal respiratory effort, lungs CTA bilaterally, no wheezing ABDOMEN:  soft, nontender, nondistended, no obvious masses, no hepatomegaly,  normal bowel sounds SKIN:  turgor, no lesions seen Musculoskeletal:  Normal muscle tone,normal strength NEURO: Alert and oriented x 3, no focal neurologic  deficits  ASSESSMENT and PLAN:  1. [age]ar old year old female with left sided UC, maintained on Colazal. Last colonoscopy with endoscopic and histologic findings of  mildly active chronic colitis involving sigmoid and rectum. Remaining colon with biopsies was negative. Recent flare symptoms treated with Canasa supp and prednisone taper. She didn't think canasa supp helped so stopped them and added Entocort. Had to start another prednisone taper several days ago.Finally improving. -Continue with steroid taper. Currently at 63m62m Prednisone daily and tapering by 5mg 5meek. Will restart Entocort 9mg w36m prednisone at 15mg d36m.  Will continue to taper until back to her normal dose (of 5mg) th65mDr. DevashwaPatrecia Pourer on.  -continue Colazal -Continue Entocort 9mg dail1mWill keep her on this dose until follow up.  -Follow up with Dr. Stark in [place], or sooner if not continuing to improve.   2. Pancreatic cyst. Follow up MR in Feb 2017 actually showed that the side branch IPMN had actually decreased in size. Follow up MRI recommended at one year.    Kathleen Wickersham GueTye Savoy017, 10:54 AM   Noticed that AVS instruction regarding prednisone and entocort is incorrect. Will ask my nurse to call patient in am to make sure she has correct instructions.

## 2016-06-12 ENCOUNTER — Telehealth: Payer: Self-pay | Admitting: *Deleted

## 2016-06-12 NOTE — Telephone Encounter (Signed)
===  View-only below this line===  ----- Message ----- From: Greggory Keen, LPN Sent: 21/05/7355  12:22 PM To: Oda Kilts, CMA  Please help me. I have to pick up a sick kid. Would you call her for me? ----- Message ----- From: Willia Craze, NP Sent: 06/11/2016   5:29 PM To: Greggory Keen, LPN  Beth, I noticed that the AVS on this patient isn't correct. Would you mind calling her. I think she understood as we discussed in clinic but I want her to restart entocort  44m daily when prednisone dose taper reaches 179m She will continue on with the prednisone taper as previously prescribed by Dr. StFuller Planver the phone last month. Thanks  Called patient to clarify instructions on prednisone and Entocort

## 2016-06-12 NOTE — Progress Notes (Signed)
Reviewed and agree with management plan.  Ayleen Mckinstry T. Dillan Candela, MD FACG 

## 2016-06-16 NOTE — Telephone Encounter (Signed)
I have spoken with the patient. She has tapered her prednisone to 15 mg today and will start the Entocort. She understands her taper schedule.

## 2016-06-26 ENCOUNTER — Ambulatory Visit: Payer: PPO | Admitting: Gastroenterology

## 2016-07-02 ENCOUNTER — Encounter: Payer: Self-pay | Admitting: Cardiovascular Disease

## 2016-07-02 NOTE — Progress Notes (Signed)
Cardiology Office Note   Date:  07/07/2016   ID:  Kathleen Morales, DOB 1929-07-02, MRN [id number]  PCP:  [name], PA-C  Cardiologist:   Jenkins Rouge, MD   Chief Complaint  Patient presents with  . Establish Care      History of Present Illness: Kathleen Morales is a 82 y.o. female who presents for f/u. Last seen by me in 2012 cleared to have left TKR seen by PA 2015 to be cleared for right TKR Had both down with no issues.Has LE venous disease Duplex done 04/07/16 with no DVT or [place] Cyst Echo done 2014 with normal EF MAC no other valve disease. Carotid 2014 with plaque no stenosis .  Has been complaining of rapid heart beat.  Last few months not related to exercise. Feels quivery Tremulous. No associated chest pain, syncope or dyspnea. Gets anxious lasts a minute or two.  Denies excess ETOH or caffeine Episodes can occur any time of day. Go away spontaneously       Past Medical History:  Diagnosis Date  . Arthritis    takes Prednisone daily for inflammation  . Bruises easily   . Diverticulosis   . Fibromyalgia   . Gastric ulcer    hx of ;many yrs ago  . Hemorrhoids   . History of [place] spotted fever    Elevated titer 04/2006  . Hoarseness    pt states always like this;ENT can't find anything wrong  . Hx of colonic polyps   . Joint pain   . Joint swelling   . Leg pain   . Nocturia   . Polymyalgia rheumatica ([place])   . Spine disorder    has had injections and wears a brace  . 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 Ulcerative colitis 1970   takes Colazal bid    Past Surgical History:  Procedure Laterality Date  . ABDOMINAL HYSTERECTOMY    . CARPAL TUNNEL RELEASE     bilateral  . CATARACT EXTRACTION, BILATERAL    . COLONOSCOPY    . CYSTECTOMY     Breast x 2-Right  . epidural injections     x 2   . PARTIAL HYSTERECTOMY     at age 32  . TOTAL KNEE ARTHROPLASTY  04/06/2012   Procedure: TOTAL KNEE ARTHROPLASTY;  Surgeon: [name], MD;  Location: [facility];  Service: Orthopedics;  Laterality: Left;  . TOTAL KNEE ARTHROPLASTY Right 05/02/2014   Procedure: TOTAL KNEE ARTHROPLASTY;  Surgeon: [name], MD;  Location: [facility];  Service: Orthopedics;  Laterality: Right;  . TUBAL LIGATION       Current Outpatient Prescriptions  Medication Sig Dispense Refill  . balsalazide (COLAZAL) 750 MG capsule TAKE 3 CAPSULES BY MOUTH 3 TIMES DAILY 270 capsule 2  . Biotin 1000 MCG tablet Take 1,000 mcg by mouth daily.    . Cholecalciferol (VITAMIN D3) 2000 UNITS TABS Take 2,000 Units by mouth daily.    Marland Kitchen estradiol (ESTRACE) 0.5 MG tablet Take 0.5 mg by mouth at bedtime.     . gabapentin (NEURONTIN) 100 MG capsule Take 2 capsules (200 mg total) by mouth 3 (three) times daily. 180 capsule 5  . predniSONE (DELTASONE) 5 MG tablet Take 5 mg by mouth daily with breakfast.    . vitamin B-12 (CYANOCOBALAMIN) 1000 MCG tablet Take 1,000 mcg by mouth daily.    . metoprolol tartrate (LOPRESSOR) 25 MG tablet Take 1 tablet (25 mg total) by  mouth 2 (two) times daily. 180 tablet 3   No current facility-administered medications for this visit.     Allergies:   Xarelto [rivaroxaban]    Social History:  The patient  reports that she has never smoked. She has never used smokeless tobacco. She reports that she does not drink alcohol or use drugs.   Family History:  The patient's family history includes Breast cancer in her daughter, other, and sister; Colon polyps in her other; Heart disease in her father and mother.    ROS:  Please see the history of present illness.   Otherwise, review of systems are positive for none.   All other systems are reviewed and negative.    PHYSICAL EXAM: VS:  BP 140/80   Pulse 74   Ht 5' 1"  (1.549 m)   Wt 141 lb 9.6 oz (64.2 kg)   BMI 26.76 kg/m  , BMI Body mass index is 26.76 kg/m. Affect appropriate Healthy:  appears stated age 65: normal Neck supple with no adenopathy JVP normal no bruits  no thyromegaly Lungs clear with no wheezing and good diaphragmatic motion Heart:  S1/S2 no murmur, no rub, gallop or click PMI normal Abdomen: benighn, BS positve, no tenderness, no AAA no bruit.  No HSM or HJR Distal pulses intact with no bruits Chronic venous stasis and varicosities with mild edema  Neuro non-focal Skin warm and dry No muscular weakness    EKG:   SR rate 64 normal ECG 03/20/14  07/07/16 SR rate 68 PVC otherwise normal    Recent Labs: 02/20/2016: ALT 11; BUN 14; Creatinine, Ser 0.68; Hemoglobin 14.6; Platelets 344.0; Potassium 4.6; Sodium 138    Lipid Panel No results found for: CHOL, TRIG, HDL, CHOLHDL, VLDL, LDLCALC, LDLDIRECT    Wt Readings from Last 3 Encounters:  07/07/16 141 lb 9.6 oz (64.2 kg)  06/10/16 138 lb (62.6 kg)  02/20/16 138 lb 2 oz (62.7 kg)      Other studies Reviewed: Additional studies/ records that were reviewed today include: Previous cardiology notes echo .    ASSESSMENT AND PLAN:  1. Palpitations : with PVC on ECG f/u event monitor start lopressor 25 bid 2. Venous Insufficiency: compression stockings suggest f/u at vein clinic  3. Colitis : finished with steroid taper may be related to some increase in palpitations F/u Dr [place] Plan 4. Arthritis:  On low dose steroids    Current medicines are reviewed at length with the patient today.  The patient does not have concerns regarding medicines.  The following changes have been made:  Lopressor 25 bid   Labs/ tests ordered today include: Event monitor   Orders Placed This Encounter  Procedures  . Cardiac event monitor  . EKG 12-Lead     Disposition:   FU with me next available      Signed, Jenkins Rouge, MD  07/07/2016 4:00 PM    [facility] [place], [place], [place]  [address] Phone: [phone]; Fax: [phone]

## 2016-07-07 ENCOUNTER — Encounter: Payer: Self-pay | Admitting: Cardiovascular Disease

## 2016-07-07 ENCOUNTER — Encounter (INDEPENDENT_AMBULATORY_CARE_PROVIDER_SITE_OTHER): Payer: Self-pay

## 2016-07-07 ENCOUNTER — Ambulatory Visit (INDEPENDENT_AMBULATORY_CARE_PROVIDER_SITE_OTHER): Payer: PPO | Admitting: Cardiovascular Disease

## 2016-07-07 VITALS — BP 140/80 | HR 74 | Ht 61.0 in | Wt 141.6 lb

## 2016-07-07 DIAGNOSIS — R002 Palpitations: Secondary | ICD-10-CM | POA: Diagnosis not present

## 2016-07-07 DIAGNOSIS — Z7689 Persons encountering health services in other specified circumstances: Secondary | ICD-10-CM

## 2016-07-07 MED ORDER — METOPROLOL TARTRATE 25 MG PO TABS: 25.0000 mg | ORAL_TABLET | Freq: Two times a day (BID) | ORAL | 3 refills | Status: DC

## 2016-07-07 NOTE — Patient Instructions (Addendum)
Medication Instructions:  Your physician has recommended you make the following change in your medication:  1-START Metoprolol 25 mg by mouth twice daily  Labwork: NONE  Testing/Procedures: Your physician has recommended that you wear an event monitor. Event monitors are medical devices that record the heart's electrical activity. Doctors most often [place] these monitors to diagnose arrhythmias. Arrhythmias are problems with the speed or rhythm of the heartbeat. The monitor is a small, portable device. You can wear one while you do your normal daily activities. This is usually used to diagnose what is causing palpitations/syncope (passing out).  Follow-Up: Your physician wants you to follow-up in: next avialable with Dr. Johnsie Cancel.    If you need a refill on your cardiac medications before your next appointment, please call your pharmacy.

## 2016-07-13 ENCOUNTER — Encounter (HOSPITAL_COMMUNITY): Payer: Self-pay | Admitting: Certified Nurse Midwife

## 2016-07-13 ENCOUNTER — Emergency Department (HOSPITAL_COMMUNITY)
Admission: EM | Admit: 2016-07-13 | Discharge: 2016-07-13 | Disposition: A | Discharge status: Home / Self Care | Payer: PPO | Attending: Emergency Medicine | Admitting: Emergency Medicine

## 2016-07-13 DIAGNOSIS — R0989 Other specified symptoms and signs involving the circulatory and respiratory systems: Secondary | ICD-10-CM | POA: Diagnosis not present

## 2016-07-13 DIAGNOSIS — T17308A Unspecified foreign body in larynx causing other injury, initial encounter: Secondary | ICD-10-CM

## 2016-07-13 DIAGNOSIS — Z96653 Presence of artificial knee joint, bilateral: Secondary | ICD-10-CM | POA: Insufficient documentation

## 2016-07-13 DIAGNOSIS — R1010 Upper abdominal pain, unspecified: Secondary | ICD-10-CM | POA: Diagnosis not present

## 2016-07-13 MED ORDER — GLUCAGON HCL RDNA (DIAGNOSTIC) 1 MG IJ SOLR
1.0000 mg | Freq: Once | INTRAMUSCULAR | Status: AC
Start: 2016-07-13 — End: 2016-07-13
  Administered 2016-07-13: 1 mg via INTRAVENOUS
  Filled 2016-07-13: qty 1

## 2016-07-13 NOTE — ED Triage Notes (Signed)
Pt states she was eating dinner and choked on a piece of chicken. Pt arrives via [facility]. Pt states she has pain in her sternum area that feels like knife stabbing her. Pt AO x4 and speaking in full sentences.

## 2016-07-13 NOTE — ED Provider Notes (Signed)
[facility] DEPT Provider Note   CSN: [id number] Arrival date & time: 07/13/16  1537     History   Chief Complaint Chief Complaint  Patient presents with  . Choking    HPI Kathleen Morales is a 82 y.o. female.  Patient choked on a piece of chicken at dinner tonight. She feels a odd sensation in her upper chest. She is able to swallow water. No airway difficulties. Similar episode in the past several weeks. No history of esophageal stricture      Past Medical History:  Diagnosis Date  . Arthritis    takes Prednisone daily for inflammation  . Bruises easily   . Diverticulosis   . Fibromyalgia   . Gastric ulcer    hx of ;many yrs ago  . Hemorrhoids   . History of [place] spotted fever    Elevated titer 04/2006  . Hoarseness    pt states always like this;ENT can't find anything wrong  . Hx of colonic polyps   . Joint pain   . Joint swelling   . Leg pain   . Nocturia   . Polymyalgia rheumatica ([place])   . Spine disorder    has had injections and wears a brace  . 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 Ulcerative colitis 1970   takes Colazal bid    Patient Active Problem List   Diagnosis Date Noted  . Abnormality of gait 10/24/2015  . Right lumbar radiculopathy 09/26/2015  . Osteoarthritis of right knee 05/04/2014  . S/P total knee replacement using cement 05/02/2014  . Syncope 03/24/2013  . Dizziness 03/24/2013  . Elevated blood pressure 03/24/2013  . Osteoarthritis of knee 04/05/2012  . Preoperative clearance 03/17/2012  . Varicose veins 03/17/2011  . Claudication 03/17/2011  . Dyspnea 03/17/2011  . POLYMYALGIA RHEUMATICA 11/15/2009  . ULCERATIVE COLITIS-LEFT SIDE 08/15/2008  . PERSONAL HX COLONIC POLYPS 08/15/2008    Past Surgical History:  Procedure Laterality Date  . ABDOMINAL HYSTERECTOMY    . CARPAL TUNNEL RELEASE     bilateral  . CATARACT EXTRACTION, BILATERAL    . COLONOSCOPY    . CYSTECTOMY     Breast x 2-Right  .  epidural injections     x 2   . PARTIAL HYSTERECTOMY     at age 60  . TOTAL KNEE ARTHROPLASTY  04/06/2012   Procedure: TOTAL KNEE ARTHROPLASTY;  Surgeon: [name], MD;  Location: [facility];  Service: Orthopedics;  Laterality: Left;  . TOTAL KNEE ARTHROPLASTY Right 05/02/2014   Procedure: TOTAL KNEE ARTHROPLASTY;  Surgeon: [name], MD;  Location: [facility];  Service: Orthopedics;  Laterality: Right;  . TUBAL LIGATION      OB History    No data available       Home Medications    Prior to Admission medications   Medication Sig Start Date End Date Taking? Authorizing Provider  balsalazide (COLAZAL) 750 MG capsule TAKE 3 CAPSULES BY MOUTH 3 TIMES DAILY 05/02/16  Yes Ladene Artist, MD  Biotin 1000 MCG tablet Take 1,000 mcg by mouth daily.   Yes Historical Provider, MD  budesonide (ENTOCORT EC) 3 MG 24 hr capsule Take 9 mg by mouth every morning.   Yes Historical Provider, MD  Cholecalciferol (VITAMIN D3) 2000 UNITS TABS Take 2,000 Units by mouth daily.   Yes Historical Provider, MD  estradiol (ESTRACE) 0.5 MG tablet Take 0.5 mg by mouth at bedtime.    Yes Historical Provider, MD  gabapentin (NEURONTIN)  100 MG capsule Take 2 capsules (200 mg total) by mouth 3 (three) times daily. 02/28/16  Yes Kathrynn Ducking, MD  metoprolol tartrate (LOPRESSOR) 25 MG tablet Take 1 tablet (25 mg total) by mouth 2 (two) times daily. 07/07/16 10/05/16 Yes Josue Hector, MD  predniSONE (DELTASONE) 5 MG tablet Take 5 mg by mouth daily with breakfast.   Yes Historical Provider, MD  vitamin B-12 (CYANOCOBALAMIN) 1000 MCG tablet Take 1,000 mcg by mouth daily.   Yes Historical Provider, MD    Family History Family History  Problem Relation Age of Onset  . Heart disease Mother   . Heart disease Father   . Breast cancer Sister   . Breast cancer Daughter   . Breast cancer Other     neice  . Colon polyps Other     nephew  . Colon cancer Neg Hx     Social History Social History  Substance Use  Topics  . Smoking status: Never Smoker  . Smokeless tobacco: Never Used  . Alcohol use No     Allergies   Xarelto [rivaroxaban]   Review of Systems Review of Systems  All other systems reviewed and are negative.    Physical Exam Updated Vital Signs BP 145/66   Pulse 65   Temp 97.6 F (36.4 C) (Oral)   Resp 17   Ht 5' 1"  (1.549 m)   Wt 144 lb (65.3 kg)   SpO2 100%   BMI 27.21 kg/m   Physical Exam  Constitutional: She is oriented to person, place, and time. She appears well-developed and well-nourished.  HENT:  Head: Normocephalic and atraumatic.  Eyes: Conjunctivae are normal.  Neck: Neck supple.  Cardiovascular: Normal rate and regular rhythm.   Pulmonary/Chest: Effort normal and breath sounds normal.  Abdominal: Soft. Bowel sounds are normal.  Musculoskeletal: Normal range of motion.  Neurological: She is alert and oriented to person, place, and time.  Skin: Skin is warm and dry.  Psychiatric: She has a normal mood and affect. Her behavior is normal.  Nursing note and vitals reviewed.    ED Treatments / Results  Labs (all labs ordered are listed, but only abnormal results are displayed) Labs Reviewed - No data to display  EKG  EKG Interpretation None       Radiology No results found.  Procedures Procedures (including critical care time)  Medications Ordered in ED Medications  glucagon (human recombinant) (GLUCAGEN) injection 1 mg (1 mg Intravenous Given 07/13/16 1628)     Initial Impression / Assessment and Plan / ED Course  I have reviewed the triage vital signs and the nursing notes.  Pertinent labs & imaging results that were available during my care of the patient were reviewed by me and considered in my medical decision making (see chart for details).  Clinical Course     Patient is stable. Glucagon 1 mg IV administered. She is able to swallow fluids without regurgitation. She was observed for approximately 2 hours. Follow-up with  gastroenterology  Final Clinical Impressions(s) / ED Diagnoses   Final diagnoses:  Choking, initial encounter    New Prescriptions New Prescriptions   No medications on file     Nat Christen, MD 07/13/16 1921

## 2016-07-13 NOTE — ED Notes (Signed)
Pt given sips of water. Pt had severe pain with swallowing.

## 2016-07-13 NOTE — Discharge Instructions (Signed)
Clear liquids tonight. Follow-up with gastroenterologist.

## 2016-07-15 ENCOUNTER — Telehealth: Payer: Self-pay

## 2016-07-15 ENCOUNTER — Ambulatory Visit (INDEPENDENT_AMBULATORY_CARE_PROVIDER_SITE_OTHER): Payer: PPO

## 2016-07-15 DIAGNOSIS — R002 Palpitations: Secondary | ICD-10-CM

## 2016-07-15 DIAGNOSIS — R1310 Dysphagia, unspecified: Secondary | ICD-10-CM

## 2016-07-15 DIAGNOSIS — R1319 Other dysphagia: Secondary | ICD-10-CM

## 2016-07-15 NOTE — Telephone Encounter (Signed)
Patient is scheduled for BS with tab at [facility] for 07/18/16 at 11:00.  She needs to arrive at 10:45 in radiology and be NPO for 3 hours prior.  Left message for patient to call back

## 2016-07-15 NOTE — Telephone Encounter (Signed)
Patient notified of the recommendations and BS on Friday She is scheduled for 07/18/16 1:30 for pre-visit and EGD dil in the [place] for 08/21/15

## 2016-07-15 NOTE — Telephone Encounter (Signed)
PPI bid Soft diet, avoiding meats and bread for now Schedule barium esophagram with tablet Schedule EGD with dilation after esophagram

## 2016-07-15 NOTE — Telephone Encounter (Signed)
Patient walked in to the office today to report that she is having dysphagia.  She was in the ED on Sunday with a food impaction that was able to pass with Glucoagon. She reports that she is having some burning in her esophagus.  She is advised that she needs to increase her PPI to BID and avoid meat and bread.  She is advised to remain on a soft diet.  Dr. [facility] Plan should she have a dilation?

## 2016-07-18 ENCOUNTER — Ambulatory Visit (HOSPITAL_COMMUNITY)
Admission: RE | Admit: 2016-07-18 | Discharge: 2016-07-18 | Disposition: A | Discharge status: Home / Self Care | Payer: PPO | Source: Ambulatory Visit | Attending: Gastroenterology | Admitting: Gastroenterology

## 2016-07-18 ENCOUNTER — Telehealth: Payer: Self-pay | Admitting: *Deleted

## 2016-07-18 ENCOUNTER — Ambulatory Visit: Payer: PPO | Admitting: *Deleted

## 2016-07-18 VITALS — Ht 61.0 in | Wt 144.6 lb

## 2016-07-18 DIAGNOSIS — K219 Gastro-esophageal reflux disease without esophagitis: Secondary | ICD-10-CM | POA: Insufficient documentation

## 2016-07-18 DIAGNOSIS — K449 Diaphragmatic hernia without obstruction or gangrene: Secondary | ICD-10-CM | POA: Diagnosis not present

## 2016-07-18 DIAGNOSIS — K224 Dyskinesia of esophagus: Secondary | ICD-10-CM | POA: Diagnosis not present

## 2016-07-18 DIAGNOSIS — R1319 Other dysphagia: Secondary | ICD-10-CM

## 2016-07-18 DIAGNOSIS — R1310 Dysphagia, unspecified: Secondary | ICD-10-CM | POA: Insufficient documentation

## 2016-07-18 NOTE — Telephone Encounter (Signed)
Patient returning Linda's call

## 2016-07-18 NOTE — Progress Notes (Signed)
Denies allergies to eggs or soy products. Denies complications with sedation or anesthesia. Denies O2 use. Denies use of diet or weight loss medications.

## 2016-07-18 NOTE — Telephone Encounter (Signed)
Kathleen Morales,  This pt completed her BS today and then came to [facility]. She is wearing a cardiac monitor until the 18th of January for c/o palpitations. We will need cardiac clearance to proceed with EGD 08/20/16. Could you please send note out to cardiology, Dr. Johnsie Cancel, requesting clearance for EGD?  Thank you, Olivia Mackie

## 2016-07-18 NOTE — Telephone Encounter (Signed)
See result note.  

## 2016-07-22 NOTE — Telephone Encounter (Signed)
Patient will let us know once she hears the results.  I will check back with her on 08/18/16

## 2016-07-22 NOTE — Telephone Encounter (Signed)
Dr. Fuller Plan I am not sure if you are aware of the concerns.  She will have holter monitor on until 08/14/16 and procedure in on 08/20/16.  Do you want her seen in the office or ok to proceed if Holter is normal?

## 2016-07-22 NOTE — Telephone Encounter (Signed)
OK to proceed with EGD if Holter doesn't have significant findings. If Holter may not be read in time for EGD can postpone EGD 1 week or so.

## 2016-07-24 ENCOUNTER — Other Ambulatory Visit: Payer: Self-pay | Admitting: Gastroenterology

## 2016-08-06 ENCOUNTER — Telehealth: Payer: Self-pay | Admitting: Cardiovascular Disease

## 2016-08-06 NOTE — Telephone Encounter (Signed)
Pt calling with questions regarding event monitor-pls call [phone]

## 2016-08-06 NOTE — Telephone Encounter (Signed)
Patient needs duration of 30 day cardiac event monitor shortened to have cardiac clearance prior to her scheduled esophageal dilitation on 08/20/16.  [employer] has be contacted to shorten duration to 27 days.   Her monitor is now for 27 days and her End of Service Report will be available for review on 08/14/16.  Please forward test results for cardiac clearance to Dr. Lucio Edward for timely preparation of scheduled  08/20/16 procedure.

## 2016-08-06 NOTE — Telephone Encounter (Signed)
See Dr. Kyla Balzarine note

## 2016-08-07 NOTE — Telephone Encounter (Signed)
Don't need to wait on event monitor to do EGD she is ok for this

## 2016-08-07 NOTE — Telephone Encounter (Signed)
OK, thanks

## 2016-08-14 ENCOUNTER — Other Ambulatory Visit: Payer: Self-pay | Admitting: Gastroenterology

## 2016-08-14 DIAGNOSIS — K51919 Ulcerative colitis, unspecified with unspecified complications: Secondary | ICD-10-CM

## 2016-08-20 ENCOUNTER — Encounter: Payer: Self-pay | Admitting: Gastroenterology

## 2016-08-20 ENCOUNTER — Encounter: Payer: PPO | Admitting: Gastroenterology

## 2016-08-20 ENCOUNTER — Ambulatory Visit (AMBULATORY_SURGERY_CENTER): Payer: PPO | Admitting: Gastroenterology

## 2016-08-20 VITALS — BP 164/77 | HR 61 | Temp 96.4°F | Resp 17 | Ht 61.0 in | Wt 144.0 lb

## 2016-08-20 DIAGNOSIS — R55 Syncope and collapse: Secondary | ICD-10-CM | POA: Diagnosis not present

## 2016-08-20 DIAGNOSIS — Z86010 Personal history of colonic polyps: Secondary | ICD-10-CM | POA: Diagnosis not present

## 2016-08-20 DIAGNOSIS — R933 Abnormal findings on diagnostic imaging of other parts of digestive tract: Secondary | ICD-10-CM

## 2016-08-20 DIAGNOSIS — K5190 Ulcerative colitis, unspecified, without complications: Secondary | ICD-10-CM | POA: Diagnosis not present

## 2016-08-20 DIAGNOSIS — R1310 Dysphagia, unspecified: Secondary | ICD-10-CM

## 2016-08-20 DIAGNOSIS — K2950 Unspecified chronic gastritis without bleeding: Secondary | ICD-10-CM | POA: Diagnosis not present

## 2016-08-20 DIAGNOSIS — K222 Esophageal obstruction: Secondary | ICD-10-CM | POA: Diagnosis not present

## 2016-08-20 DIAGNOSIS — R1319 Other dysphagia: Secondary | ICD-10-CM

## 2016-08-20 DIAGNOSIS — I10 Essential (primary) hypertension: Secondary | ICD-10-CM | POA: Diagnosis not present

## 2016-08-20 DIAGNOSIS — K229 Disease of esophagus, unspecified: Secondary | ICD-10-CM

## 2016-08-20 DIAGNOSIS — K208 Other esophagitis: Secondary | ICD-10-CM | POA: Diagnosis not present

## 2016-08-20 DIAGNOSIS — B9681 Helicobacter pylori [H. pylori] as the cause of diseases classified elsewhere: Secondary | ICD-10-CM | POA: Diagnosis not present

## 2016-08-20 MED ORDER — SODIUM CHLORIDE 0.9 % IV SOLN: 500.0000 mL | INTRAVENOUS | Status: DC

## 2016-08-20 MED ORDER — OMEPRAZOLE 40 MG PO CPDR: 40.0000 mg | DELAYED_RELEASE_CAPSULE | Freq: Every day | ORAL | 3 refills | Status: DC

## 2016-08-20 NOTE — Patient Instructions (Signed)
Impression/Recommendations:  Hiatal hernia handout given to patient.  Nothing by mouth for one hour. Clear liquid diet for 2 hours.  Then advance to soft diet today.   Resume prior diet tomorrow.  Return to GI office in 6 weeks.  Prilosec (omeprazole) 40 mg. By mouth daily.  YOU HAD AN ENDOSCOPIC PROCEDURE TODAY AT [facility]:   Refer to the procedure report that was given to you for any specific questions about what was found during the examination.  If the procedure report does not answer your questions, please call your gastroenterologist to clarify.  If you requested that your care partner not be given the details of your procedure findings, then the procedure report has been included in a sealed envelope for you to review at your convenience later.  YOU SHOULD EXPECT: Some feelings of bloating in the abdomen. Passage of more gas than usual.  Walking can help get rid of the air that was put into your GI tract during the procedure and reduce the bloating. If you had a lower endoscopy (such as a colonoscopy or flexible sigmoidoscopy) you may notice spotting of blood in your stool or on the toilet paper. If you underwent a bowel prep for your procedure, you may not have a normal bowel movement for a few days.  Please Note:  You might notice some irritation and congestion in your nose or some drainage.  This is from the oxygen used during your procedure.  There is no need for concern and it should clear up in a day or so.  SYMPTOMS TO REPORT IMMEDIATELY:  Following upper endoscopy (EGD)  Vomiting of blood or coffee ground material  New chest pain or pain under the shoulder blades  Painful or persistently difficult swallowing  New shortness of breath  Fever of 100F or higher  Black, tarry-looking stools  For urgent or emergent issues, a gastroenterologist can be reached at any hour by calling [phone].   DIET:  We do recommend a small meal at first, but then you  may proceed to your regular diet.  Drink plenty of fluids but you should avoid alcoholic beverages for 24 hours.  ACTIVITY:  You should plan to take it easy for the rest of today and you should NOT DRIVE or use heavy machinery until tomorrow (because of the sedation medicines used during the test).    FOLLOW UP: Our staff will call the number listed on your records the next business day following your procedure to check on you and address any questions or concerns that you may have regarding the information given to you following your procedure. If we do not reach you, we will leave a message.  However, if you are feeling well and you are not experiencing any problems, there is no need to return our call.  We will assume that you have returned to your regular daily activities without incident.  If any biopsies were taken you will be contacted by phone or by letter within the next 1-3 weeks.  Please call us at [phone] if you have not heard about the biopsies in 3 weeks.    SIGNATURES/CONFIDENTIALITY: You and/or your care partner have signed paperwork which will be entered into your electronic medical record.  These signatures attest to the fact that that the information above on your After Visit Summary has been reviewed and is understood.  Full responsibility of the confidentiality of this discharge information lies with you and/or your care-partner.

## 2016-08-20 NOTE — Op Note (Signed)
[facility] Patient Name: Kathleen Morales Procedure Date: 08/20/2016 10:13 AM MRN: [id number] Endoscopist: Ladene Artist , MD Age: 83 Referring MD:  Date of Birth: Jun 19, 1929 Gender: Female Account #: [id number] Procedure:                Upper GI endoscopy Indications:              Dysphagia Medicines:                Monitored Anesthesia Care Procedure:                Pre-Anesthesia Assessment:                           - Prior to the procedure, a History and Physical                            was performed, and patient medications and                            allergies were reviewed. The patient's tolerance of                            previous anesthesia was also reviewed. The risks                            and benefits of the procedure and the sedation                            options and risks were discussed with the patient.                            All questions were answered, and informed consent                            was obtained. Prior Anticoagulants: The patient has                            taken no previous anticoagulant or antiplatelet                            agents. ASA Grade Assessment: III - A patient with                            severe systemic disease. After reviewing the risks                            and benefits, the patient was deemed in                            satisfactory condition to undergo the procedure.                           After obtaining informed consent, the endoscope was  passed under direct vision. Throughout the                            procedure, the patient's blood pressure, pulse, and                            oxygen saturations were monitored continuously. The                            Model GIF-[id number]) scope was introduced                            through the mouth, and advanced to the second part                            of duodenum. The upper GI endoscopy  was                            accomplished without difficulty. The patient                            tolerated the procedure well. Scope In: Scope Out: Findings:                 One mild benign-appearing, intrinsic stenosis was                            found at the gastroesophageal junction. This                            measured 1.3 cm (inner diameter) and was traversed.                            A guidewire was placed and the scope was withdrawn.                            Dilations performed with Savary dilators with mild                            resistance at 14 mm, 15 mm and 16 mm. Mild                            resistance to all 3 and no heme noted.                           Circumferential salmon-colored mucosa was present                            from 36 cm to 38 cm. The maximum longitudinal                            extent of these esophageal mucosal changes was 2 cm  in length. Biopsies were taken with a cold forceps                            for histology.                           The exam of the esophagus was otherwise normal.                           Diffuse moderately erythematous mucosa without                            bleeding was found in the entire examined stomach.                            Biopsies were taken with a cold forceps for                            histology.                           A small hiatal hernia was present.                           The exam of the stomach was otherwise normal.                           The duodenal bulb and second portion of the                            duodenum were normal. Complications:            No immediate complications. Estimated Blood Loss:     Estimated blood loss was minimal. Impression:               - Benign-appearing esophageal stenosis. Dilated.                           - Salmon-colored mucosa suggestive of short-segment                            Barrett's  esophagus. Biopsied.                           - Erythematous mucosa in the stomach. Biopsied.                           - Small hiatal hernia.                           - Normal duodenal bulb and second portion of the                            duodenum. Recommendation:           - Patient has a contact number available for  emergencies. The signs and symptoms of potential                            delayed complications were discussed with the                            patient. Return to normal activities tomorrow.                            Written discharge instructions were provided to the                            patient.                           - Clear liquid diet for 2 hours then advance as                            tolerated to soft diet today. Resume prior diet                            tomorrow. - Continue present medications.                           - Await pathology results.                           - Return to GI office in 6 weeks.                           - Prilosec (omeprazole) 40 mg PO daily indefinitely. Ladene Artist, MD 08/20/2016 10:41:06 AM This report has been signed electronically.

## 2016-08-20 NOTE — Progress Notes (Signed)
To PACU, vss patent aw report to rn

## 2016-08-20 NOTE — Progress Notes (Signed)
Called to room to assist during endoscopic procedure.  Patient ID and intended procedure confirmed with present staff. Received instructions for my participation in the procedure from the performing physician.  

## 2016-08-21 ENCOUNTER — Telehealth: Payer: Self-pay

## 2016-08-21 NOTE — Telephone Encounter (Signed)
  Follow up Call-  Call back number 08/20/2016  Post procedure Call Back phone  # [phone]  Permission to leave phone message Yes  Some recent data might be hidden     Patient questions:  Do you have a fever, pain , or abdominal swelling? No. Pain Score  0 *  Have you tolerated food without any problems? Yes.    Have you been able to return to your normal activities? Yes.    Do you have any questions about your discharge instructions: Diet   No. Medications  No. Follow up visit  No.  Do you have questions or concerns about your Care? No.  Actions: * If pain score is 4 or above: No action needed, pain <4.

## 2016-09-02 ENCOUNTER — Other Ambulatory Visit: Payer: Self-pay | Admitting: Gastroenterology

## 2016-09-03 ENCOUNTER — Telehealth: Payer: Self-pay | Admitting: Gastroenterology

## 2016-09-03 NOTE — Telephone Encounter (Signed)
Discussed with Dr. Fuller Plan.  She is to try imodium per package instructions.  She is notified of the recommendations.  She will call back if the imodium fails to help

## 2016-09-12 NOTE — Progress Notes (Deleted)
Cardiology Office Note   Date:  09/12/2016   ID:  Kathleen Morales, DOB May 26, 1929, MRN [id number]  PCP:  [name], PA-C  Cardiologist:   Jenkins Rouge, MD   No chief complaint on file.     History of Present Illness: Kathleen Morales is a 83 y.o. female who presents for f/u. Seen by me in 2012 cleared to have left TKR seen by PA 2015 to be cleared for right TKR Had both done with no issues.Has LE venous disease Duplex done 04/07/16 with no DVT or [place] Cyst Echo done 2014 with normal EF MAC no other valve disease. Carotid 2014 with plaque no stenosis .  Has been complaining of rapid heart beat.  Last few months not related to exercise. Feels quivery Tremulous. No associated chest pain, syncope or dyspnea. Gets anxious lasts a minute or two.  Denies excess ETOH or caffeine Episodes can occur any time of day. Go away spontaneously  Started on lopressor 07/07/16   Event monitor 07/15/16 no arrhythmia   Had uneventful EGD Dr Fuller Plan 08/20/16 small hiatal hernia and mild esophageal stenosis dilated   Past Medical History:  Diagnosis Date  . Arthritis    takes Prednisone daily for inflammation  . Bruises easily   . Diverticulosis   . Fibromyalgia   . Gastric ulcer    hx of ;many yrs ago  . Hemorrhoids   . History of [place] spotted fever    Elevated titer 04/2006  . Hoarseness    pt states always like this;ENT can't find anything wrong  . Hx of colonic polyps   . Joint pain   . Joint swelling   . Leg pain   . Nocturia   . Palpitations   . Polymyalgia rheumatica ([place])   . Spine disorder    has had injections and wears a brace  . 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 Ulcerative colitis 1970   takes Colazal bid    Past Surgical History:  Procedure Laterality Date  . ABDOMINAL HYSTERECTOMY    . CARPAL TUNNEL RELEASE     bilateral  . CATARACT EXTRACTION, BILATERAL    . COLONOSCOPY    . CYSTECTOMY     Breast x 2-Right  . epidural injections      x 2   . PARTIAL HYSTERECTOMY     at age 31  . TOTAL KNEE ARTHROPLASTY  04/06/2012   Procedure: TOTAL KNEE ARTHROPLASTY;  Surgeon: [name], MD;  Location: [facility];  Service: Orthopedics;  Laterality: Left;  . TOTAL KNEE ARTHROPLASTY Right 05/02/2014   Procedure: TOTAL KNEE ARTHROPLASTY;  Surgeon: [name], MD;  Location: [facility];  Service: Orthopedics;  Laterality: Right;  . TUBAL LIGATION       Current Outpatient Prescriptions  Medication Sig Dispense Refill  . balsalazide (COLAZAL) 750 MG capsule TAKE 3 CAPSULES BY MOUHT 3 TIMES DAILY. 270 capsule 1  . Biotin 1000 MCG tablet Take 1,000 mcg by mouth daily.    . budesonide (ENTOCORT EC) 3 MG 24 hr capsule Take 9 mg by mouth every morning.    . budesonide (ENTOCORT EC) 3 MG 24 hr capsule TAKE 3 CAPSULES BY MOUTH DAILY 90 capsule 3  . Cholecalciferol (VITAMIN D3) 2000 UNITS TABS Take 2,000 Units by mouth daily.    Marland Kitchen estradiol (ESTRACE) 0.5 MG tablet Take 0.5 mg by mouth at bedtime.     . gabapentin (NEURONTIN) 100 MG capsule Take 2 capsules (200  mg total) by mouth 3 (three) times daily. 180 capsule 5  . metoprolol tartrate (LOPRESSOR) 25 MG tablet Take 1 tablet (25 mg total) by mouth 2 (two) times daily. 180 tablet 3  . omeprazole (PRILOSEC) 40 MG capsule Take 1 capsule (40 mg total) by mouth daily. 90 capsule 3  . predniSONE (DELTASONE) 5 MG tablet Take 5 mg by mouth daily with breakfast.    . vitamin B-12 (CYANOCOBALAMIN) 1000 MCG tablet Take 1,000 mcg by mouth daily.     Current Facility-Administered Medications  Medication Dose Route Frequency Provider Last Rate Last Dose  . 0.9 %  sodium chloride infusion  500 mL Intravenous Continuous Ladene Artist, MD        Allergies:   Xarelto [rivaroxaban]    Social History:  The patient  reports that she has never smoked. She has never used smokeless tobacco. She reports that she does not drink alcohol or use drugs.   Family History:  The patient's family history includes  Breast cancer in her daughter, other, and sister; Colon polyps in her other; Heart disease in her father and mother.    ROS:  Please see the history of present illness.   Otherwise, review of systems are positive for none.   All other systems are reviewed and negative.    PHYSICAL EXAM: VS:  There were no vitals taken for this visit. , BMI There is no height or weight on file to calculate BMI. Affect appropriate Healthy:  appears stated age 55: normal Neck supple with no adenopathy JVP normal no bruits no thyromegaly Lungs clear with no wheezing and good diaphragmatic motion Heart:  S1/S2 no murmur, no rub, gallop or click PMI normal Abdomen: benighn, BS positve, no tenderness, no AAA no bruit.  No HSM or HJR Distal pulses intact with no bruits Chronic venous stasis and varicosities with mild edema  Neuro non-focal Skin warm and dry No muscular weakness    EKG:   SR rate 64 normal ECG 03/20/14  07/07/16 SR rate 68 PVC otherwise normal    Recent Labs: 02/20/2016: ALT 11; BUN 14; Creatinine, Ser 0.68; Hemoglobin 14.6; Platelets 344.0; Potassium 4.6; Sodium 138    Lipid Panel No results found for: CHOL, TRIG, HDL, CHOLHDL, VLDL, LDLCALC, LDLDIRECT    Wt Readings from Last 3 Encounters:  08/20/16 144 lb (65.3 kg)  07/18/16 144 lb 9.6 oz (65.6 kg)  07/13/16 144 lb (65.3 kg)      Other studies Reviewed: Additional studies/ records that were reviewed today include: Previous cardiology notes echo .    ASSESSMENT AND PLAN:  1. Palpitations : with PVC on ECG f/u better with lopressor event monitor no significant arrhythmia 2. Venous Insufficiency: compression stockings suggest f/u at vein clinic  3. Colitis : f/U Dr Fuller Plan recent EGD hiatal hernia and mild esophageal stenosis  4. Arthritis:  On low dose steroids    Current medicines are reviewed at length with the patient today.  The patient does not have concerns regarding medicines.  The following changes have been  made:      Labs/ tests ordered today include:   No orders of the defined types were placed in this encounter.    Disposition:   FU with me in a year      Signed, Jenkins Rouge, MD  09/12/2016 3:30 PM    [address], [place], [place]  [address] Phone: [phone]; Fax: [phone]

## 2016-09-17 ENCOUNTER — Other Ambulatory Visit: Payer: Self-pay | Admitting: Neurology

## 2016-09-17 ENCOUNTER — Telehealth: Payer: Self-pay

## 2016-09-17 DIAGNOSIS — K869 Disease of pancreas, unspecified: Secondary | ICD-10-CM

## 2016-09-17 NOTE — Telephone Encounter (Signed)
Left message for patient to call back and discuss timing of MRCP

## 2016-09-17 NOTE — Telephone Encounter (Signed)
-----   Message from Marlon Pel, RN sent at 09/17/2015  1:09 PM EST ----- Needs MRI in 1 year see results 09/17/15

## 2016-09-18 NOTE — Telephone Encounter (Signed)
Patient has been scheduled for 09/29/16 12:10 at [facility]

## 2016-09-18 NOTE — Telephone Encounter (Signed)
Patient aware and will be scheduled by [facility].  They will place order for BUN and creatinine as well

## 2016-09-22 DIAGNOSIS — N3001 Acute cystitis with hematuria: Secondary | ICD-10-CM | POA: Diagnosis not present

## 2016-09-22 DIAGNOSIS — N3090 Cystitis, unspecified without hematuria: Secondary | ICD-10-CM | POA: Diagnosis not present

## 2016-09-23 ENCOUNTER — Telehealth: Payer: Self-pay | Admitting: Gastroenterology

## 2016-09-23 ENCOUNTER — Ambulatory Visit: Payer: PPO | Admitting: Cardiovascular Disease

## 2016-09-23 NOTE — Telephone Encounter (Signed)
Patient is notified ok to proceed with MRI.

## 2016-09-23 NOTE — Telephone Encounter (Signed)
Left message for patient to call back  

## 2016-09-29 ENCOUNTER — Ambulatory Visit
Admission: RE | Admit: 2016-09-29 | Discharge: 2016-09-29 | Disposition: A | Discharge status: Home / Self Care | Payer: PPO | Source: Ambulatory Visit | Attending: Gastroenterology | Admitting: Gastroenterology

## 2016-09-29 DIAGNOSIS — K869 Disease of pancreas, unspecified: Secondary | ICD-10-CM

## 2016-09-29 DIAGNOSIS — R935 Abnormal findings on diagnostic imaging of other abdominal regions, including retroperitoneum: Secondary | ICD-10-CM | POA: Diagnosis not present

## 2016-09-29 DIAGNOSIS — K862 Cyst of pancreas: Secondary | ICD-10-CM | POA: Diagnosis not present

## 2016-09-29 MED ORDER — GADOBENATE DIMEGLUMINE 529 MG/ML IV SOLN
13.0000 mL | Freq: Once | INTRAVENOUS | Status: AC | PRN
Start: 2016-09-29 — End: 2016-09-29
  Administered 2016-09-29: 13 mL via INTRAVENOUS

## 2016-09-30 ENCOUNTER — Telehealth: Payer: Self-pay | Admitting: Gastroenterology

## 2016-09-30 NOTE — Telephone Encounter (Signed)
All questions answered

## 2016-11-13 ENCOUNTER — Encounter: Payer: Self-pay | Admitting: Gastroenterology

## 2016-11-24 ENCOUNTER — Other Ambulatory Visit: Payer: Self-pay | Admitting: Gastroenterology

## 2016-11-24 DIAGNOSIS — K51919 Ulcerative colitis, unspecified with unspecified complications: Secondary | ICD-10-CM

## 2016-11-26 ENCOUNTER — Encounter: Payer: Self-pay | Admitting: Neurology

## 2016-11-26 ENCOUNTER — Encounter (INDEPENDENT_AMBULATORY_CARE_PROVIDER_SITE_OTHER): Payer: Self-pay

## 2016-11-26 ENCOUNTER — Ambulatory Visit (INDEPENDENT_AMBULATORY_CARE_PROVIDER_SITE_OTHER): Payer: PPO | Admitting: Neurology

## 2016-11-26 VITALS — BP 168/79 | HR 60 | Ht 61.0 in | Wt 141.5 lb

## 2016-11-26 DIAGNOSIS — M7989 Other specified soft tissue disorders: Secondary | ICD-10-CM

## 2016-11-26 DIAGNOSIS — R269 Unspecified abnormalities of gait and mobility: Secondary | ICD-10-CM | POA: Diagnosis not present

## 2016-11-26 DIAGNOSIS — M5416 Radiculopathy, lumbar region: Secondary | ICD-10-CM | POA: Diagnosis not present

## 2016-11-26 MED ORDER — GABAPENTIN 100 MG PO CAPS: 200.0000 mg | ORAL_CAPSULE | Freq: Three times a day (TID) | ORAL | 4 refills | Status: DC

## 2016-11-26 NOTE — Progress Notes (Signed)
PATIENT: Kathleen Morales DOB: 09-06-1928  Chief Complaint  Patient presents with  . Back Pain    She is here with her significant other, Kathleen Morales.  Feels her back and leg pain is tolerable with gabapentin 248m, TID.      HISTORICAL  Kathleen DEANGELOis 83years old right-handed female, company by her housemate Kathleen Morales seen in refer by Kathleen Morales for evaluation of gait difficulty, low back pain, radiating pain to bilateral lower extremity, her primary care is Kathleen Morales Initial evaluation was on September 26 2015.  She had a history of polymyalgia rheumatica since 2007, has been treated with low-dose prednisone, osteoarthritis, history of ulcerative colitis, bilateral knee replacement  She had chronic low back pain, progressive worsened since 2015, she noticed radiating pain down the right lateral thigh, numbness achiness at right lateral leg, she also had a gradual worsening gait difficulty, poor balance, she continues to have multiple joints pain, despite bilateral knee replacement, she also complains of significant right shoulder pain, limited range of motion of her right shoulder, cracking sound of her neck when she turns around  She denies bowel and bladder incontinence, she has no bilateral hands paresthesia or weakness, she complains of bilateral plantar feet numbness tingling since 2016.  Update October 24 2015: She returned for electrodiagnostic study today, which demonstrate mild bilateral lumbosacral radiculopathy, mainly involving bilateral L4, L5, S1 myotomes.  We also reviewed MRI of lumbar without contrast together in March 2017: multilevel degenerative changes as detailed above. The most significant findings are: 1. At L3-L4 there is minimal anterolisthesis of L3 upon L4 due to severe facet hypertrophy there is narrowing of the central canal with transverse diameter and moderate right lateral recess stenosis. Does not appear to be nerve root  compression. 2. At L4-L5 there is lateral spondylolisthesis of L4 upon L5 leading to extensive protrusion of the uncovered disc on the right and facet hypertrophy. Severe right foraminal stenosis and moderately severe right lateral recess stenosis. There is probable right L4 possible right L5 nerve root compression. 3. At L5-S1 there are degenerative changes as a moderately severe left foraminal narrowing that could lead to left L5 nerve root compression. 4. There are milder degenerative changes at the other levels as described above.  She has moderate to severe right-sided low back pain, radiating pain to right lower extremity, gabapentin 100 mg 3 times a day has been helpful, she reported 50% improvement of her pain, no significant side effect notice, she denies bowel and bladder incontinence, the most bothersome symptoms is her slow worsening gait difficulty.  UPDATE Nov 26 2016: Her right-sided low back pain has much improved with gabapentin 100 mg 2 tablets 3 times a day We have personally reviewed MRI of lumbar spine in March 2017, multilevel degenerative disc disease, scoliosis that is convex to the left in the lumbar spine, at L4-5, there is severe right foraminal stenosis, possible right L5 nerve root compression.  She also complains of left leg swelling, pain, reported previous Doppler study failed to demonstrate left lower extremity DVT.  She ambulate without walker, mildly unsteady, denies bowel and bladder incontinence.  REVIEW OF SYSTEMS: Full 14 system review of systems performed and notable only for joint pain, swelling, achy muscles  ALLERGIES: Allergies  Allergen Reactions  . Xarelto [Rivaroxaban] Swelling    Legs turned black    HOME MEDICATIONS: Current Outpatient Prescriptions  Medication Sig Dispense Refill  . balsalazide (COLAZAL) 750 MG capsule TAKE 3  CAPSULES BY MOUTH 3 TIMES DAILY 270 capsule 0  . Biotin 1000 MCG tablet Take 1,000 mcg by mouth daily.    .  budesonide (ENTOCORT EC) 3 MG 24 hr capsule TAKE 3 CAPSULES BY MOUTH DAILY 90 capsule 3  . Cholecalciferol (VITAMIN D3) 2000 UNITS TABS Take 2,000 Units by mouth daily.    Marland Kitchen estradiol (ESTRACE) 0.5 MG tablet Take 0.5 mg by mouth at bedtime.     . gabapentin (NEURONTIN) 100 MG capsule Take 2 capsules by mouth 3 times daily.  Please call [phone] to schedule an appt for continued refills. 180 capsule 1  . omeprazole (PRILOSEC) 40 MG capsule Take 1 capsule (40 mg total) by mouth daily. 90 capsule 3  . predniSONE (DELTASONE) 5 MG tablet Take 5 mg by mouth daily with breakfast.    . vitamin B-12 (CYANOCOBALAMIN) 1000 MCG tablet Take 1,000 mcg by mouth daily.    . metoprolol tartrate (LOPRESSOR) 25 MG tablet Take 1 tablet (25 mg total) by mouth 2 (two) times daily. 180 tablet 3   Current Facility-Administered Medications  Medication Dose Route Frequency Provider Last Rate Last Dose  . 0.9 %  sodium chloride infusion  500 mL Intravenous Continuous Ladene Artist, MD        PAST MEDICAL HISTORY: Past Medical History:  Diagnosis Date  . Arthritis    takes Prednisone daily for inflammation  . Bruises easily   . Diverticulosis   . Fibromyalgia   . Gastric ulcer    hx of ;many yrs ago  . Hemorrhoids   . History of [place] spotted fever    Elevated titer 04/2006  . Hoarseness    pt states always like this;ENT can't find anything wrong  . Hx of colonic polyps   . Joint pain   . Joint swelling   . Leg pain   . Nocturia   . Palpitations   . Polymyalgia rheumatica ([place])   . Spine disorder    has had injections and wears a brace  . 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 Ulcerative colitis 1970   takes Colazal bid    PAST SURGICAL HISTORY: Past Surgical History:  Procedure Laterality Date  . ABDOMINAL HYSTERECTOMY    . CARPAL TUNNEL RELEASE     bilateral  . CATARACT EXTRACTION, BILATERAL    . COLONOSCOPY    . CYSTECTOMY     Breast x 2-Right  . epidural injections       x 2   . PARTIAL HYSTERECTOMY     at age 52  . TOTAL KNEE ARTHROPLASTY  04/06/2012   Procedure: TOTAL KNEE ARTHROPLASTY;  Surgeon: [name], MD;  Location: [facility];  Service: Orthopedics;  Laterality: Left;  . TOTAL KNEE ARTHROPLASTY Right 05/02/2014   Procedure: TOTAL KNEE ARTHROPLASTY;  Surgeon: [name], MD;  Location: [facility];  Service: Orthopedics;  Laterality: Right;  . TUBAL LIGATION      FAMILY HISTORY: Family History  Problem Relation Age of Onset  . Heart disease Mother   . Heart disease Father   . Breast cancer Sister   . Breast cancer Daughter   . Breast cancer Other     neice  . Colon polyps Other     nephew  . Colon cancer Neg Hx   . Rectal cancer Neg Hx   . Stomach cancer Neg Hx   . Esophageal cancer Neg Hx     SOCIAL HISTORY:  Social History   Social  History  . Marital status: Widowed    Spouse name: N/A  . Number of children: 4  . Years of education: HS   Occupational History  . Retired Retired   Social History Main Topics  . Smoking status: Never Smoker  . Smokeless tobacco: Never Used  . Alcohol use No  . Drug use: No  . Sexual activity: No   Other Topics Concern  . Not on file   Social History Narrative   Daily caffeine use 1-2 cup/day   Gets regular exercise   Right-handed.   Lives with a friend, Tera Morales.           PHYSICAL EXAM   Vitals:   11/26/16 0919  BP: (!) 168/79  Pulse: 60  Weight: 141 lb 8 oz (64.2 kg)  Height: 5' 1"  (1.549 m)    Not recorded      Body mass index is 26.74 kg/m.  PHYSICAL EXAMNIATION:  Gen: NAD, conversant, well nourised, obese, well groomed                     Cardiovascular: Regular rate rhythm, no peripheral edema, warm, nontender. Eyes: Conjunctivae clear without exudates or hemorrhage Neck: Supple, no carotid bruise. Pulmonary: Clear to auscultation bilaterally   NEUROLOGICAL EXAM:  MENTAL STATUS: Speech:    Speech is normal; fluent and spontaneous with normal  comprehension.  Cognition:     Orientation to time, place and person     Normal recent and remote memory     Normal Attention span and concentration     Normal Language, naming, repeating,spontaneous speech     Fund of knowledge   CRANIAL NERVES: CN II: Visual fields are full to confrontation. Fundoscopic exam is normal with sharp discs and no vascular changes. Pupils are round equal and briskly reactive to light. CN III, IV, VI: extraocular movement are normal. No ptosis. CN V: Facial sensation is intact to pinprick in all 3 divisions bilaterally. Corneal responses are intact.  CN VII: Face is symmetric with normal eye closure and smile. CN VIII: Hearing is normal to rubbing fingers CN IX, X: Palate elevates symmetrically. Phonation is normal. CN XI: Head turning and shoulder shrug are intact CN XII: Tongue is midline with normal movements and no atrophy.  MOTOR: Limited range of motion of right shoulder, there is no significant bilateral upper extremity proximal or distal weakness, there is no significant bilateral lower extremity proximal weakness, With exception of mild bilateral toe extension weakness  REFLEXES: Reflexes are 1 and symmetric at the biceps, triceps, absent at knees, and ankles. Plantar responses are flexor.  SENSORY: Mildly length dependent decreased to light touch, pinprick and vibratory sensation.  COORDINATION: Rapid alternating movements and fine finger movements are intact. There is no dysmetria on finger-to-nose and heel-knee-shin.    GAIT/STANCE: She needs to push up on the chair to get up from seated position, limp, unsteady, dragging both foot across the floor, she could not stand up on tiptoe on heels.  DIAGNOSTIC DATA (LABS, IMAGING, TESTING) - I reviewed patient records, labs, notes, testing and imaging myself where available.   ASSESSMENT AND PLAN  TANAYSIA BHARDWAJ is a 83 y.o. female   Chronic low back pain, radiating pain to right  leg,   Significant multilevel lumbar degenerative disc disease, with variable degree of foraminal stenosis at different levels, most severe is lateral spondylolisthesis of L4 on L5, leading to extensive protrusion of the uncovered disc on the right and facet hypertrophy,  severe right foraminal stenosis, with possible right L4, L5 nerve root compression.  I have refilled gabapentin 100 mg 2 tablets 3 times a day,  Gait difficulty  Multifactorial, joints pain, distal leg weakness, limited range of motion of right shoulder  Continue moderate exercise  Left lower extremity swelling, tenderness upon deep palpitation  Reported negative for DVT in September 2017  I have suggested tight stocking, massage, leg exercise    Marcial Pacas, M.D. Ph.D.  [facility] [address], [place], [place] [address] Ph: [phone] Fax: [phone]  CC: Bo Merino, MD. PCP Kathleen Bender, PA-C

## 2016-12-08 ENCOUNTER — Other Ambulatory Visit: Payer: Self-pay | Admitting: Gastroenterology

## 2016-12-10 ENCOUNTER — Telehealth: Payer: Self-pay | Admitting: Gastroenterology

## 2016-12-10 MED ORDER — AMBULATORY NON FORMULARY MEDICATION: 1 refills | Status: DC

## 2016-12-10 NOTE — Telephone Encounter (Addendum)
Patient states she went to refill her rx for budesonide and its over $300 because she is in a donut hole. Patient would like to know if there is anything else she can take in it's place. Informed patient that there is not another medication similar to this medication. Patient states she has been out of medication since 12/08/16 and has not had any recurrent symptoms. Patient is still on balsalazide at same dose. Patient has f/u scheduled for June. Offered patient a compounded budesonide 5 mg that is dispensed at [facility] in [place] ,[place] that is cheaper at 60 tablets for $100. Patient states she will do that instead. Faxed new rx to the pharmacy for 5 mg 2 tablets daily. Patient states she will discuss with her daughter and have her go pick up medication.

## 2016-12-27 ENCOUNTER — Other Ambulatory Visit: Payer: Self-pay | Admitting: Gastroenterology

## 2016-12-27 DIAGNOSIS — K51919 Ulcerative colitis, unspecified with unspecified complications: Secondary | ICD-10-CM

## 2016-12-31 ENCOUNTER — Ambulatory Visit (INDEPENDENT_AMBULATORY_CARE_PROVIDER_SITE_OTHER): Payer: PPO | Admitting: Rheumatology

## 2016-12-31 ENCOUNTER — Encounter: Payer: Self-pay | Admitting: Rheumatology

## 2016-12-31 VITALS — BP 120/74 | HR 74 | Resp 16 | Wt 138.0 lb

## 2016-12-31 DIAGNOSIS — M47818 Spondylosis without myelopathy or radiculopathy, sacral and sacrococcygeal region: Secondary | ICD-10-CM | POA: Diagnosis not present

## 2016-12-31 DIAGNOSIS — M5416 Radiculopathy, lumbar region: Secondary | ICD-10-CM

## 2016-12-31 DIAGNOSIS — M353 Polymyalgia rheumatica: Secondary | ICD-10-CM | POA: Diagnosis not present

## 2016-12-31 DIAGNOSIS — M5441 Lumbago with sciatica, right side: Secondary | ICD-10-CM

## 2016-12-31 DIAGNOSIS — M5442 Lumbago with sciatica, left side: Secondary | ICD-10-CM | POA: Diagnosis not present

## 2016-12-31 DIAGNOSIS — G8929 Other chronic pain: Secondary | ICD-10-CM

## 2016-12-31 DIAGNOSIS — M461 Sacroiliitis, not elsewhere classified: Secondary | ICD-10-CM

## 2016-12-31 MED ORDER — TRIAMCINOLONE ACETONIDE 40 MG/ML IJ SUSP
40.0000 mg | INTRAMUSCULAR | Status: AC | PRN
  Administered 2016-12-31: 40 mg via INTRA_ARTICULAR

## 2016-12-31 MED ORDER — LIDOCAINE HCL (PF) 1 % IJ SOLN
1.5000 mL | INTRAMUSCULAR | Status: AC | PRN
  Administered 2016-12-31: 1.5 mL

## 2016-12-31 NOTE — Progress Notes (Signed)
Office Visit Note  Patient: Kathleen Morales             Date of Birth: 1928-08-29           MRN: [id number]             PCP: [name], PA-C Referring: [name], PA-C Visit Date: 12/31/2016 Occupation: @GUAROCC @    Subjective:  Pain of the Lower Back (Bilateral legs painful also )   History of Present Illness: Kathleen Morales is a 83 y.o. female last seen in our office on 04/07/2016. She has a history of PMR and she presented today because she was having weakness in her hip girdle and thought that it may be a flare of her PMR. In addition she also is complaining of   low back pain with radiation down both of her legs.  Reviewing her chart reveals that she recently saw a neurologist Dr. Mikeal Hawthorne who treated her for similar problem. She is on gabapentin and is giving her some relief. Lately, her back pain is gotten little bit worse and she did not know who to go to.  She's having radiculopathy from her SI joint down to her buttocks bilaterally and mid upper thigh in the posterior aspect.  No falls or any injuries.    Activities of Daily Living:  Patient reports morning stiffness for 30 minutes.   Patient Reports nocturnal pain.  Difficulty dressing/grooming: Reports Difficulty climbing stairs: Reports Difficulty getting out of chair: Reports Difficulty using hands for taps, buttons, cutlery, and/or writing: Reports   Review of Systems  Constitutional: Negative for fatigue.  HENT: Negative for mouth sores and mouth dryness.   Eyes: Negative for dryness.  Respiratory: Negative for shortness of breath.   Gastrointestinal: Negative for constipation and diarrhea.  Musculoskeletal: Negative for myalgias and myalgias.  Skin: Negative for sensitivity to sunlight.  Psychiatric/Behavioral: Negative for decreased concentration and sleep disturbance.    PMFS History:  Patient Active Problem List   Diagnosis Date Noted  . Gait abnormality 11/26/2016  . Left leg swelling  11/26/2016  . Abnormality of gait 10/24/2015  . Right lumbar radiculopathy 09/26/2015  . Osteoarthritis of right knee 05/04/2014  . S/P total knee replacement using cement 05/02/2014  . Syncope 03/24/2013  . Dizziness 03/24/2013  . Elevated blood pressure 03/24/2013  . Osteoarthritis of knee 04/05/2012  . Preoperative clearance 03/17/2012  . Varicose veins 03/17/2011  . Claudication 03/17/2011  . Dyspnea 03/17/2011  . POLYMYALGIA RHEUMATICA 11/15/2009  . ULCERATIVE COLITIS-LEFT SIDE 08/15/2008  . PERSONAL HX COLONIC POLYPS 08/15/2008    Past Medical History:  Diagnosis Date  . Arthritis    takes Prednisone daily for inflammation  . Bruises easily   . Diverticulosis   . Fibromyalgia   . Gastric ulcer    hx of ;many yrs ago  . Hemorrhoids   . History of [place] spotted fever    Elevated titer 04/2006  . Hoarseness    pt states always like this;ENT can't find anything wrong  . Hx of colonic polyps   . Joint pain   . Joint swelling   . Leg pain   . Nocturia   . Palpitations   . Polymyalgia rheumatica ([place])   . Spine disorder    has had injections and wears a brace  . 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 Ulcerative colitis 1970   takes Colazal bid    Family History  Problem Relation Age of Onset  . Heart  disease Mother   . Heart disease Father   . Breast cancer Sister   . Breast cancer Daughter   . Breast cancer Other        neice  . Colon polyps Other        nephew  . Colon cancer Neg Hx   . Rectal cancer Neg Hx   . Stomach cancer Neg Hx   . Esophageal cancer Neg Hx    Past Surgical History:  Procedure Laterality Date  . ABDOMINAL HYSTERECTOMY    . CARPAL TUNNEL RELEASE     bilateral  . CATARACT EXTRACTION, BILATERAL    . COLONOSCOPY    . CYSTECTOMY     Breast x 2-Right  . epidural injections     x 2   . PARTIAL HYSTERECTOMY     at age 23  . TOTAL KNEE ARTHROPLASTY  04/06/2012   Procedure: TOTAL KNEE ARTHROPLASTY;  Surgeon: [name], MD;  Location: [facility];  Service: Orthopedics;  Laterality: Left;  . TOTAL KNEE ARTHROPLASTY Right 05/02/2014   Procedure: TOTAL KNEE ARTHROPLASTY;  Surgeon: [name], MD;  Location: [facility];  Service: Orthopedics;  Laterality: Right;  . TUBAL LIGATION     Social History   Social History Narrative   Daily caffeine use 1-2 cup/day   Gets regular exercise   Right-handed.   Lives with a friend, [name].           Objective: Vital Signs: BP 120/74   Pulse 74   Resp 16   Wt 138 lb (62.6 kg)   BMI 26.07 kg/m    Physical Exam  Constitutional: She is oriented to person, place, and time. She appears well-developed and well-nourished.  HENT:  Head: Normocephalic and atraumatic.  Eyes: EOM are normal. Pupils are equal, round, and reactive to light.  Cardiovascular: Normal rate, regular rhythm and normal heart sounds.  Exam reveals no gallop and no friction rub.   No murmur heard. Pulmonary/Chest: Effort normal and breath sounds normal. She has no wheezes. She has no rales.  Abdominal: Soft. Bowel sounds are normal. She exhibits no distension. There is no tenderness. There is no guarding. No hernia.  Musculoskeletal: Normal range of motion. She exhibits no edema, tenderness or deformity.  Lymphadenopathy:    She has no cervical adenopathy.  Neurological: She is alert and oriented to person, place, and time. Coordination normal.  Skin: Skin is warm and dry. Capillary refill takes less than 2 seconds. No rash noted.  Psychiatric: She has a normal mood and affect. Her behavior is normal.  Nursing note and vitals reviewed.    Musculoskeletal Exam:  Decreased range of motion of right shoulder joint but full range of motion of left shoulder joint Grip strength is equal and strong bilaterally Fibromyalgia tender points are all absent  Patient has fairly good strength to upper and lower extremity bilaterally and does not show any evidence of PMR flare.  CDAI Exam: CDAI  Homunculus Exam:   Joint Counts:  CDAI Tender Joint count: 0 CDAI Swollen Joint count: 0  Global Assessments:  Patient Global Assessment: 8 Provider Global Assessment: 8  CDAI Calculated Score: 16  No synovitis on examination  Investigation: No additional findings. No visits with results within 6 Month(s) from this visit.  Latest known visit with results is:  Lab on 02/20/2016  Component Date Value Ref Range Status  . WBC 02/20/2016 10.0  4.0 - 10.5 K/uL Final  . RBC 02/20/2016 4.46  3.87 -  5.11 Mil/uL Final  . Hemoglobin 02/20/2016 14.6  12.0 - 15.0 g/dL Final  . HCT 02/20/2016 43.4  36.0 - 46.0 % Final  . MCV 02/20/2016 97.2  78.0 - 100.0 fl Final  . MCHC 02/20/2016 33.5  30.0 - 36.0 g/dL Final  . RDW 02/20/2016 13.6  11.5 - 15.5 % Final  . Platelets 02/20/2016 344.0  150.0 - 400.0 K/uL Final  . Neutrophils Relative % 02/20/2016 68.5  43.0 - 77.0 % Final  . Lymphocytes Relative 02/20/2016 24.3  12.0 - 46.0 % Final  . Monocytes Relative 02/20/2016 6.0  3.0 - 12.0 % Final  . Eosinophils Relative 02/20/2016 0.9  0.0 - 5.0 % Final  . Basophils Relative 02/20/2016 0.3  0.0 - 3.0 % Final  . Neutro Abs 02/20/2016 6.8  1.4 - 7.7 K/uL Final  . Lymphs Abs 02/20/2016 2.4  0.7 - 4.0 K/uL Final  . Monocytes Absolute 02/20/2016 0.6  0.1 - 1.0 K/uL Final  . Eosinophils Absolute 02/20/2016 0.1  0.0 - 0.7 K/uL Final  . Basophils Absolute 02/20/2016 0.0  0.0 - 0.1 K/uL Final  . Sodium 02/20/2016 138  135 - 145 mEq/L Final  . Potassium 02/20/2016 4.6  3.5 - 5.1 mEq/L Final  . Chloride 02/20/2016 102  96 - 112 mEq/L Final  . CO2 02/20/2016 31  19 - 32 mEq/L Final  . Glucose, Bld 02/20/2016 103* 70 - 99 mg/dL Final  . BUN 02/20/2016 14  6 - 23 mg/dL Final  . Creatinine, Ser 02/20/2016 0.68  0.40 - 1.20 mg/dL Final  . Total Bilirubin 02/20/2016 0.6  0.2 - 1.2 mg/dL Final  . Alkaline Phosphatase 02/20/2016 67  39 - 117 U/L Final  . AST 02/20/2016 20  0 - 37 U/L Final  . ALT 02/20/2016 11   0 - 35 U/L Final  . Total Protein 02/20/2016 7.2  6.0 - 8.3 g/dL Final  . Albumin 02/20/2016 4.2  3.5 - 5.2 g/dL Final  . Calcium 02/20/2016 9.8  8.4 - 10.5 mg/dL Final  . GFR 02/20/2016 87.29  >60.00 mL/min Final     Imaging: No results found.  Speciality Comments: No specialty comments available.    Procedures:  Large Joint Inj Date/Time: 12/31/2016 4:46 PM Performed by: Eliezer Lofts Authorized by: Eliezer Lofts   Consent Given by:  Patient Site marked: the procedure site was marked   Timeout: prior to procedure the correct patient, procedure, and site was verified   Indications:  Pain Location:  Sacroiliac Site:  L sacroiliac joint Prep: patient was prepped and draped in usual sterile fashion   Needle Size:  27 G Needle Length:  1.5 inches Approach:  Superior Ultrasound Guidance: No   Fluoroscopic Guidance: No   Arthrogram: No   Medications:  40 mg triamcinolone acetonide 40 MG/ML; 1.5 mL lidocaine (PF) 1 % Aspiration Attempted: Yes   Aspirate amount (mL):  0 Patient tolerance:  Patient tolerated the procedure well with no immediate complications  PATIENT TOLERATED PROCEDURE WELL. THERE WERE NO COMPLICATIONS.   Large Joint Inj Date/Time: 12/31/2016 4:47 PM Performed by: Eliezer Lofts Authorized by: Eliezer Lofts   Consent Given by:  Patient Site marked: the procedure site was marked   Timeout: prior to procedure the correct patient, procedure, and site was verified   Indications:  Pain Location:  Sacroiliac Site:  R sacroiliac joint Prep: patient was prepped and draped in usual sterile fashion   Needle Size:  27 G Needle Length:  1.5 inches Approach:  Superior Ultrasound Guidance: No   Fluoroscopic Guidance: No   Arthrogram: No   Medications:  40 mg triamcinolone acetonide 40 MG/ML; 1.5 mL lidocaine (PF) 1 % Aspiration Attempted: Yes   Aspirate amount (mL):  0  PATIENT TOLERATED PROCEDURE WELL. THERE WERE NO COMPLICATIONS.     Allergies:  Xarelto [rivaroxaban]   Assessment / Plan:     Visit Diagnoses: Polymyalgia rheumatica ([place])  Chronic bilateral low back pain with bilateral sciatica  Osteoarthritis of sacroiliac region  Injected with 40 mg of Kenalog mixed with one half and also for 1% preservative-free lidocaine without epinephrine. Injection was given bilaterally. By Dr. Estanislado Pandy. Patient tolerated procedure well. There no, occasions  Return to clinic as scheduled or in 4 months  Orders: Orders Placed This Encounter  Procedures  . Large Joint Injection/Arthrocentesis  . Large Joint Injection/Arthrocentesis   No orders of the defined types were placed in this encounter.   Face-to-face time spent with patient was 30 minutes. 50% of time was spent in counseling and coordination of care.  Follow-Up Instructions: Return as scheduled, for pmr (remission); .   Eliezer Lofts, PA-C I examined and evaluated the patient with Eliezer Lofts PA. The plan of care was discussed as noted above.  Bo Merino, MD Note - This record has been created using Editor, commissioning.  Chart creation errors have been sought, but may not always  have been located. Such creation errors do not reflect on  the standard of medical care.

## 2017-01-05 ENCOUNTER — Ambulatory Visit (INDEPENDENT_AMBULATORY_CARE_PROVIDER_SITE_OTHER): Payer: PPO | Admitting: Gastroenterology

## 2017-01-05 ENCOUNTER — Other Ambulatory Visit (INDEPENDENT_AMBULATORY_CARE_PROVIDER_SITE_OTHER): Payer: PPO

## 2017-01-05 ENCOUNTER — Encounter: Payer: Self-pay | Admitting: Gastroenterology

## 2017-01-05 VITALS — BP 118/70 | HR 62 | Ht 61.0 in | Wt 142.0 lb

## 2017-01-05 DIAGNOSIS — K869 Disease of pancreas, unspecified: Secondary | ICD-10-CM | POA: Diagnosis not present

## 2017-01-05 DIAGNOSIS — K5130 Ulcerative (chronic) rectosigmoiditis without complications: Secondary | ICD-10-CM | POA: Diagnosis not present

## 2017-01-05 DIAGNOSIS — K51819 Other ulcerative colitis with unspecified complications: Secondary | ICD-10-CM | POA: Diagnosis not present

## 2017-01-05 DIAGNOSIS — K219 Gastro-esophageal reflux disease without esophagitis: Secondary | ICD-10-CM

## 2017-01-05 LAB — CBC WITH DIFFERENTIAL/PLATELET
BASOS ABS: 0 10*3/uL (ref 0.0–0.1)
Basophils Relative: 0.3 % (ref 0.0–3.0)
Eosinophils Absolute: 0 10*3/uL (ref 0.0–0.7)
Eosinophils Relative: 0.1 % (ref 0.0–5.0)
HCT: 43.6 % (ref 36.0–46.0)
Hemoglobin: 14.5 g/dL (ref 12.0–15.0)
LYMPHS ABS: 1.7 10*3/uL (ref 0.7–4.0)
Lymphocytes Relative: 16.2 % (ref 12.0–46.0)
MCHC: 33.3 g/dL (ref 30.0–36.0)
MCV: 98.9 fl (ref 78.0–100.0)
MONO ABS: 0.8 10*3/uL (ref 0.1–1.0)
Monocytes Relative: 8 % (ref 3.0–12.0)
NEUTROS PCT: 75.4 % (ref 43.0–77.0)
Neutro Abs: 7.8 10*3/uL — ABNORMAL HIGH (ref 1.4–7.7)
Platelets: 301 10*3/uL (ref 150.0–400.0)
RBC: 4.41 Mil/uL (ref 3.87–5.11)
RDW: 13.7 % (ref 11.5–15.5)
WBC: 10.4 10*3/uL (ref 4.0–10.5)

## 2017-01-05 LAB — COMPREHENSIVE METABOLIC PANEL
ALBUMIN: 4 g/dL (ref 3.5–5.2)
ALK PHOS: 63 U/L (ref 39–117)
ALT: 13 U/L (ref 0–35)
AST: 16 U/L (ref 0–37)
BILIRUBIN TOTAL: 0.6 mg/dL (ref 0.2–1.2)
BUN: 17 mg/dL (ref 6–23)
CO2: 28 mEq/L (ref 19–32)
Calcium: 9.5 mg/dL (ref 8.4–10.5)
Chloride: 106 mEq/L (ref 96–112)
Creatinine, Ser: 0.73 mg/dL (ref 0.40–1.20)
GFR: 80.26 mL/min (ref >60.00)
GLUCOSE: 117 mg/dL — AB (ref 70–99)
Potassium: 3.8 mEq/L (ref 3.5–5.1)
SODIUM: 142 meq/L (ref 135–145)
TOTAL PROTEIN: 6.4 g/dL (ref 6.0–8.3)

## 2017-01-05 MED ORDER — BALSALAZIDE DISODIUM 750 MG PO CAPS: ORAL_CAPSULE | ORAL | 5 refills | Status: DC

## 2017-01-05 NOTE — Patient Instructions (Addendum)
Your physician has requested that you go to the basement for lab work before leaving today.  We have sent the following medications to your pharmacy for you to pick up at your convenience: basalazide.   Thank you for choosing me and [facility].  Pricilla Riffle. Dagoberto Ligas., MD., [facility]

## 2017-01-05 NOTE — Progress Notes (Signed)
+-     History of Present Illness: This is an 83 year old female returning for follow-up of left-sided ulcerative colitis, GERD with esophageal stricture and a cystic lesion in the body of the pancreas. She is accompanied by her husband. She reports no difficulties with dysphagia or reflux symptoms. She is having normal bowel movements daily and no colitis activity. She has no complaints today. Reviewed results of MRI and EGD.  Abd MRI/MRCP 09/29/2016 IMPRESSION: 1. Interval growth of previously noted cystic lesion in the body of the pancreas which currently measures 1.6 x 2.1 cm. This lesion is suspicious for side branch intraductal papillary mucinous neoplasm (IPMN). At this time, there is no mural enhancement or internal nodularity, and no main duct communication. Current guidelines recommend repeat MRI of the abdomen with and without IV gadolinium with MRCP in 1 year to reassess this lesion. This recommendation follows ACR consensus guidelines: Management of Incidental Pancreatic Cysts: A White Paper of the ACR Incidental Findings Committee. [place] 3716;96:789-381. 2. Additional incidental findings, as above.  EGD 07/2016 - One mild benign-appearing, intrinsic stenosis was found at the gastroesophageal junction. This measured 1.3 cm (inner diameter) and was traversed. A guidewire was placed and the scope was withdrawn. Dilations performed with Savary dilators with mild resistance at 14 mm, 15 mm and 16 mm. Mild resistance to all 3 and no heme noted. - Circumferential salmon-colored mucosa was present from 36 cm to 38 cm. The maximum longitudinal extent of these esophageal mucosal changes was 2 cm in length. Biopsies were taken with a cold forceps for histology-chronic inflammation. - The exam of the esophagus was otherwise normal. - Diffuse moderately erythematous mucosa without bleeding was found in the entire examined stomach. Biopsies were taken with a cold forceps for  histology-chronic active gastritis with H pylori. - A small hiatal hernia was present. - The exam of the stomach was otherwise normal. - The duodenal bulb and second portion of the duodenum were normal.   Current Medications, Allergies, Past Medical History, Past Surgical History, Family History and Social History were reviewed in [employer] record. +- Physical Exam: General: Well developed, well nourished, no acute distress Head: Normocephalic and atraumatic Eyes:  sclerae anicteric, EOMI Ears: Normal auditory acuity Mouth: No deformity or lesions Lungs: Clear throughout to auscultation Heart: Regular rate and rhythm; no murmurs, rubs or bruits Abdomen: Soft, non tender and non distended. No masses, hepatosplenomegaly or hernias noted. Normal Bowel sounds Musculoskeletal: Symmetrical with no gross deformities  Pulses:  Normal pulses noted Extremities: No clubbing, cyanosis, edema or deformities noted Neurological: Alert oriented x 4, grossly nonfocal Psychological:  Alert and cooperative. Normal mood and affect  Assessment and Recommendations:  1. Left sided UC. Continue balzalzide 2250 mg tid. CMP, CBC today. REV in 6 months.   2. GERD with esophageal stricture. Continue omeprazole 40 mg daily and standard antireflux measures.  3. Pancreatic lesion, slight growth on recent MRI. Repeat MRI in 1 year.

## 2017-01-29 DIAGNOSIS — M545 Low back pain: Secondary | ICD-10-CM | POA: Diagnosis not present

## 2017-01-31 ENCOUNTER — Other Ambulatory Visit: Payer: Self-pay | Admitting: Gastroenterology

## 2017-02-02 DIAGNOSIS — M545 Low back pain: Secondary | ICD-10-CM | POA: Diagnosis not present

## 2017-02-05 ENCOUNTER — Ambulatory Visit (INDEPENDENT_AMBULATORY_CARE_PROVIDER_SITE_OTHER): Payer: PPO

## 2017-02-05 ENCOUNTER — Ambulatory Visit
Admission: RE | Admit: 2017-02-05 | Discharge: 2017-02-05 | Disposition: A | Discharge status: Home / Self Care | Payer: PPO | Source: Ambulatory Visit | Attending: Surgery | Admitting: Surgery

## 2017-02-05 ENCOUNTER — Ambulatory Visit (INDEPENDENT_AMBULATORY_CARE_PROVIDER_SITE_OTHER): Payer: PPO | Admitting: Surgery

## 2017-02-05 ENCOUNTER — Encounter (INDEPENDENT_AMBULATORY_CARE_PROVIDER_SITE_OTHER): Payer: Self-pay | Admitting: Surgery

## 2017-02-05 VITALS — BP 175/80 | HR 65 | Ht 61.0 in | Wt 142.0 lb

## 2017-02-05 DIAGNOSIS — I739 Peripheral vascular disease, unspecified: Secondary | ICD-10-CM | POA: Diagnosis not present

## 2017-02-05 DIAGNOSIS — M5441 Lumbago with sciatica, right side: Secondary | ICD-10-CM

## 2017-02-05 DIAGNOSIS — R269 Unspecified abnormalities of gait and mobility: Secondary | ICD-10-CM | POA: Diagnosis not present

## 2017-02-05 DIAGNOSIS — S32040A Wedge compression fracture of fourth lumbar vertebra, initial encounter for closed fracture: Secondary | ICD-10-CM | POA: Diagnosis not present

## 2017-02-05 NOTE — Progress Notes (Addendum)
Office Visit Note   Patient: Kathleen Morales           Date of Birth: 1929-05-06           MRN: [id number] Visit Date: 02/05/2017              Requested by: [name], PA-C [address] [place], [place] [address] PCP: [name], PA-C   Assessment & Plan: Visit Diagnoses:  1. Right-sided low back pain with right-sided sciatica, unspecified chronicity   2. Gait abnormality   3. Claudication     Plan: Due to patient's worsening symptoms with low back pain and right lower extremity radiculopathy with unsteady gait I will schedule MRI lumbar spine to rule out HNP/stenosis and compared to previous study that was done March 2017. I will have patient follow-up in the office with Dr. Louanne Skye after completion to discuss results. We'll discuss scheduling of lumbar ESI's at that time depending upon what is found on study. No medication changes today. I did discuss with patient that if she were to have surgery it would likely be a multilevel fusion. She would not be a great operative candidate for this possibly.  I think patient may also benefit from the use of a TENS unit and we'll see if we can get this for her.  Follow-Up Instructions: Return in about 1 week (around 02/12/2017) for review of lumbar MRI with Dr Louanne Skye.   Orders:  Orders Placed This Encounter  Procedures  . XR Lumbar Spine 2-3 Views  . MR Lumbar Spine w/o contrast   No orders of the defined types were placed in this encounter.     Procedures: No procedures performed   Clinical Data: No additional findings.   Subjective: Chief Complaint  Patient presents with  . Lower Back - Pain    HPI Patient comes in today for evaluation of low back pain and right lower extremity radiculopathy. She's had chronic issues with this but symptoms have been progressively getting worse over the last year but more so the last 3 weeks. No injury. Patient is followed by neurologist Dr. Krista Blue for this problem and has had lumbar ESI's  by Dr. Ernestina Patches in the past. She thinks the last ESI was done in 2016. That injection did okay for a little while. Had left SI joint injection with Dr. Estanislado Pandy June 2018 that only gave some improvement of her low back pain for 3 days.  Patient describes constant pain that radiates from the right buttock down to her right calf. No radicular pain in the left leg. Also describes having worsening bilateral lower extremity neurogenic claudication symptoms. Walking distances have greatly decreased over the last year. She is also not able to stand as long without having to sit down to take a break such as when she is washing the dishes or cooking. Most recent lumbar MRI scan was done 10/08/2015 report read L2-3 there is mild disc bulging and mild facet hypertrophy with a small joint effusion on the right. The neural foramina and lateral recesses or not fully narrowed and there is no nerve root impingement. L3-4 there is very minimal anterolisthesis of L3 upon L4 due to severe facet hypertrophy. The central canal was narrowed in transverse diameter. Mild disc bulging noted. There is mild right foraminal narrowing and moderate right lateral recess stenosis. L4-5 there is lateral listhesis with the L4 vertebral body shifted to the left over the L5 vertebral body. There is protrusion of the uncovered disc on  the right. Additionally there is facet hypertrophy. These factors combined to cause severe right foraminal stenosis and moderately severe right lateral recess stenosis. There is also mild left foraminal and lateral recess stenosis. There is probable right L4 and possible right L5 nerve root compression. L5-S1 facet hypertrophy, left ligamentum flavum hypertrophy and disc protrusion more to the left. These combined to cause moderately severe left foraminal narrowing. Mild to moderate left lateral recess stenosis. Foramen and lateral recess on the right are not significantly narrowed at this level. Possible left L5 nerve  root compression. Review of Systems No current cardiac pulmonary GI GU issues.  Objective: Vital Signs: BP (!) 175/80 (BP Location: Left Arm, Patient Position: Sitting)   Pulse 65   Ht 5' 1"  (1.549 m)   Wt 142 lb (64.4 kg)   BMI 26.83 kg/m   Physical Exam  Constitutional: She is oriented to person, place, and time. No distress.  HENT:  Head: Normocephalic and atraumatic.  Eyes: Pupils are equal, round, and reactive to light. EOM are normal.  Neck: Normal range of motion.  Pulmonary/Chest: No respiratory distress.  Musculoskeletal:  Gait is unsteady. Moderate to marked tenderness over the right hip greater trochanter bursa. Negative logroll. Some discomfort with right straight leg raise. Bilateral calves nontender. She does have trace weakness right lower extremity. Bilateral calves nontender.  Neurological: She is alert and oriented to person, place, and time.  Skin: Skin is warm and dry.    Ortho Exam  Specialty Comments:  No specialty comments available.  Imaging: No results found.   PMFS History: Patient Active Problem List   Diagnosis Date Noted  . Gait abnormality 11/26/2016  . Left leg swelling 11/26/2016  . Abnormality of gait 10/24/2015  . Right lumbar radiculopathy 09/26/2015  . Osteoarthritis of right knee 05/04/2014  . S/P total knee replacement using cement 05/02/2014  . Syncope 03/24/2013  . Dizziness 03/24/2013  . Elevated blood pressure 03/24/2013  . Osteoarthritis of knee 04/05/2012  . Preoperative clearance 03/17/2012  . Varicose veins 03/17/2011  . Claudication 03/17/2011  . Dyspnea 03/17/2011  . POLYMYALGIA RHEUMATICA 11/15/2009  . ULCERATIVE COLITIS-LEFT SIDE 08/15/2008  . PERSONAL HX COLONIC POLYPS 08/15/2008   Past Medical History:  Diagnosis Date  . Arthritis    takes Prednisone daily for inflammation  . Bruises easily   . Diverticulosis   . Fibromyalgia   . Gastric ulcer    hx of ;many yrs ago  . Hemorrhoids   . History of [place] spotted fever    Elevated titer 04/2006  . Hoarseness    pt states always like this;ENT can't find anything wrong  . Hx of colonic polyps   . Joint pain   . Joint swelling   . Leg pain   . Nocturia   . Palpitations   . Polymyalgia rheumatica ([place])   . Spine disorder    has had injections and wears a brace  . 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 Ulcerative colitis 1970   takes Colazal bid    Family History  Problem Relation Age of Onset  . Heart disease Mother   . Heart disease Father   . Breast cancer Sister   . Breast cancer Daughter   . Breast cancer Other        neice  . Colon polyps Other        nephew  . Colon cancer Neg Hx   . Rectal cancer Neg Hx   . Stomach cancer  Neg Hx   . Esophageal cancer Neg Hx     Past Surgical History:  Procedure Laterality Date  . ABDOMINAL HYSTERECTOMY    . CARPAL TUNNEL RELEASE     bilateral  . CATARACT EXTRACTION, BILATERAL    . COLONOSCOPY    . CYSTECTOMY     Breast x 2-Right  . epidural injections     x 2   . PARTIAL HYSTERECTOMY     at age 60  . TOTAL KNEE ARTHROPLASTY  04/06/2012   Procedure: TOTAL KNEE ARTHROPLASTY;  Surgeon: [name], MD;  Location: [facility];  Service: Orthopedics;  Laterality: Left;  . TOTAL KNEE ARTHROPLASTY Right 05/02/2014   Procedure: TOTAL KNEE ARTHROPLASTY;  Surgeon: [name], MD;  Location: [facility];  Service: Orthopedics;  Laterality: Right;  . TUBAL LIGATION     Social History   Occupational History  . Retired Retired   Social History Main Topics  . Smoking status: Never Smoker  . Smokeless tobacco: Never Used  . Alcohol use No  . Drug use: No  . Sexual activity: No

## 2017-02-09 ENCOUNTER — Telehealth (INDEPENDENT_AMBULATORY_CARE_PROVIDER_SITE_OTHER): Payer: Self-pay | Admitting: *Deleted

## 2017-02-09 NOTE — Telephone Encounter (Signed)
If having right radicular pain then ok for right L4 Transforaminal injection if ok by Lorin Mercy, however new MRI shows acute L4 compression fracture that by itself will not get much relief from esi?  Not sure on timing to get shot done

## 2017-02-09 NOTE — Telephone Encounter (Signed)
Pt came In, I made an appt for her tomorrow 7/17 for an MRI review but she wanted me to ask if there were any cancellations if she could get a shot before Wednesday 7/18. She will be out of town.

## 2017-02-10 ENCOUNTER — Ambulatory Visit (INDEPENDENT_AMBULATORY_CARE_PROVIDER_SITE_OTHER): Payer: PPO | Admitting: Orthopaedic Surgery

## 2017-02-10 ENCOUNTER — Telehealth: Payer: Self-pay | Admitting: Gastroenterology

## 2017-02-10 ENCOUNTER — Encounter (INDEPENDENT_AMBULATORY_CARE_PROVIDER_SITE_OTHER): Payer: Self-pay | Admitting: Orthopaedic Surgery

## 2017-02-10 ENCOUNTER — Ambulatory Visit (INDEPENDENT_AMBULATORY_CARE_PROVIDER_SITE_OTHER): Payer: PPO

## 2017-02-10 VITALS — BP 157/81 | HR 68 | Temp 97.1°F | Ht 61.0 in | Wt 142.0 lb

## 2017-02-10 DIAGNOSIS — G8929 Other chronic pain: Secondary | ICD-10-CM | POA: Diagnosis not present

## 2017-02-10 DIAGNOSIS — M25511 Pain in right shoulder: Secondary | ICD-10-CM | POA: Diagnosis not present

## 2017-02-10 DIAGNOSIS — S32040A Wedge compression fracture of fourth lumbar vertebra, initial encounter for closed fracture: Secondary | ICD-10-CM | POA: Diagnosis not present

## 2017-02-10 NOTE — Telephone Encounter (Signed)
Spoke with pt and advised. She will wait to see what Dr. Lorin Mercy says about the compression fracture and go from there.

## 2017-02-10 NOTE — Telephone Encounter (Signed)
I reviewed your last office note and I cannot recall if you wanted patient to d/c budesonide. Do you want patient to continue budesonide?

## 2017-02-10 NOTE — Progress Notes (Signed)
Office Visit Note   Patient: Kathleen Morales           Date of Birth: 11-04-1928           MRN: [id number] Visit Date: 02/10/2017              Requested by: [name], PA-C [address] [place],  [address] PCP: [name], PA-C   Assessment & Plan: Visit Diagnoses:  1. Closed compression fracture of fourth lumbar vertebra, initial encounter ([place])     Plan: Lumbosacral corset applied for compression fracture. Review the x-rays that show high riding head consistent with long-standing rotator cuff tear she understands she uses her arm below her and avoids outstretched overhead activity she'll have less symptoms. We discussed surgical options for her shoulder but at her age and current symptoms this is not currently recommended. We will recheck her again in one month. She'll call she has increased symptoms. MRI scanning copy of the report was given to her we reviewed the images as well as previous x-rays demonstrating compression fractures. We discussed gradual wean of the prednisone which has been unsuccessful in the past when discussed problems with osteoporosis and chronic prednisone. We discussed a walking program calcium, vitamin D.  Follow-Up Instructions: Return in about 1 month (around 03/13/2017).   Orders:  No orders of the defined types were placed in this encounter.  No orders of the defined types were placed in this encounter.     Procedures: No procedures performed   Clinical Data: No additional findings.   Subjective: Chief Complaint  Patient presents with  . Lower Back - Follow-up    HPI a 83-year-old female returns for follow-up of low back pain right lower extremity pain difficulty ambulating. She is also had right shoulder pain with the pain without stretched activities and overhead activities. Patient's had epidural steroids injections in the past for back pain. Previous MRI showed some mild disc bulge and facet hypertrophy. MRI scan 02/05/2017  is available for review which shows acute mild to moderate L4 compression fracture old L2 fracture and moderately old L5 fracture. Grade 1 L3-4 anterolisthesis and moderate canal narrowing at L3-4 and L4-5.  Review of Systems 14 point review of systems is updated. Positive history of knee osteoarthritis, syncope status post total knee arthroplasty, Percocet veins, polymyalgia rheumatica, ulcerative colitis. She's been on chronic prednisone now down to 5 mg daily.   Objective: Vital Signs: BP (!) 157/81 (BP Location: Right Arm, Patient Position: Sitting, Cuff Size: Normal)   Pulse 68   Temp (!) 97.1 F (36.2 C) (Oral)   Ht 5' 1"  (1.549 m)   Wt 142 lb (64.4 kg)   BMI 26.83 kg/m   Physical Exam  Constitutional: She is oriented to person, place, and time. She appears well-developed.  HENT:  Head: Normocephalic.  Right Ear: External ear normal.  Left Ear: External ear normal.  Eyes: Pupils are equal, round, and reactive to light.  Neck: No tracheal deviation present. No thyromegaly present.  Cardiovascular: Normal rate.   Pulmonary/Chest: Effort normal.  Abdominal: Soft.  Neurological: She is alert and oriented to person, place, and time.  Skin: Skin is warm and dry.  Psychiatric: She has a normal mood and affect. Her behavior is normal.    Ortho Exam patient has tenderness palpation lumbar spine. Chest tenderness over the greater trochanters. Negative straight leg raising to 90. She has some mild weakness right left quads negative [place]. Distal pulses palpable. No rash or  exposed skin.  Specialty Comments:  No specialty comments available.  Imaging: No results found.   PMFS History: Patient Active Problem List   Diagnosis Date Noted  . Gait abnormality 11/26/2016  . Left leg swelling 11/26/2016  . Abnormality of gait 10/24/2015  . Right lumbar radiculopathy 09/26/2015  . Osteoarthritis of right knee 05/04/2014  . S/P total knee replacement using cement 05/02/2014  .  Syncope 03/24/2013  . Dizziness 03/24/2013  . Elevated blood pressure 03/24/2013  . Osteoarthritis of knee 04/05/2012  . Preoperative clearance 03/17/2012  . Varicose veins 03/17/2011  . Claudication 03/17/2011  . Dyspnea 03/17/2011  . POLYMYALGIA RHEUMATICA 11/15/2009  . ULCERATIVE COLITIS-LEFT SIDE 08/15/2008  . PERSONAL HX COLONIC POLYPS 08/15/2008   Past Medical History:  Diagnosis Date  . Arthritis    takes Prednisone daily for inflammation  . Bruises easily   . Diverticulosis   . Fibromyalgia   . Gastric ulcer    hx of ;many yrs ago  . Hemorrhoids   . History of [place] spotted fever    Elevated titer 04/2006  . Hoarseness    pt states always like this;ENT can't find anything wrong  . Hx of colonic polyps   . Joint pain   . Joint swelling   . Leg pain   . Nocturia   . Palpitations   . Polymyalgia rheumatica ([place])   . Spine disorder    has had injections and wears a brace  . 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 Ulcerative colitis 1970   takes Colazal bid    Family History  Problem Relation Age of Onset  . Heart disease Mother   . Heart disease Father   . Breast cancer Sister   . Breast cancer Daughter   . Breast cancer Other        neice  . Colon polyps Other        nephew  . Colon cancer Neg Hx   . Rectal cancer Neg Hx   . Stomach cancer Neg Hx   . Esophageal cancer Neg Hx     Past Surgical History:  Procedure Laterality Date  . ABDOMINAL HYSTERECTOMY    . CARPAL TUNNEL RELEASE     bilateral  . CATARACT EXTRACTION, BILATERAL    . COLONOSCOPY    . CYSTECTOMY     Breast x 2-Right  . epidural injections     x 2   . PARTIAL HYSTERECTOMY     at age 6  . TOTAL KNEE ARTHROPLASTY  04/06/2012   Procedure: TOTAL KNEE ARTHROPLASTY;  Surgeon: [name], MD;  Location: [facility];  Service: Orthopedics;  Laterality: Left;  . TOTAL KNEE ARTHROPLASTY Right 05/02/2014   Procedure: TOTAL KNEE ARTHROPLASTY;  Surgeon: [name], MD;   Location: [facility];  Service: Orthopedics;  Laterality: Right;  . TUBAL LIGATION     Social History   Occupational History  . Retired Retired   Social History Main Topics  . Smoking status: Never Smoker  . Smokeless tobacco: Never Used  . Alcohol use No  . Drug use: No  . Sexual activity: No

## 2017-02-10 NOTE — Telephone Encounter (Signed)
Informed patient to d/c budesonide and to stay on Colazal. Patient verbalized understanding.

## 2017-02-10 NOTE — Telephone Encounter (Signed)
DC budesonide. Remain on Colazal.

## 2017-02-16 ENCOUNTER — Telehealth (INDEPENDENT_AMBULATORY_CARE_PROVIDER_SITE_OTHER): Payer: Self-pay

## 2017-02-16 NOTE — Telephone Encounter (Signed)
I called discussed. She  Can go to [facility] and purchase one. Discussed.

## 2017-02-16 NOTE — Telephone Encounter (Signed)
Patient would like a call back concerning stimulator.  Cb# is [phone].  Please advise.  Thank You.

## 2017-02-18 DIAGNOSIS — Z0000 Encounter for general adult medical examination without abnormal findings: Secondary | ICD-10-CM | POA: Diagnosis not present

## 2017-02-18 DIAGNOSIS — Z1389 Encounter for screening for other disorder: Secondary | ICD-10-CM | POA: Diagnosis not present

## 2017-02-18 DIAGNOSIS — E785 Hyperlipidemia, unspecified: Secondary | ICD-10-CM | POA: Diagnosis not present

## 2017-02-18 DIAGNOSIS — Z9181 History of falling: Secondary | ICD-10-CM | POA: Diagnosis not present

## 2017-03-17 ENCOUNTER — Ambulatory Visit (INDEPENDENT_AMBULATORY_CARE_PROVIDER_SITE_OTHER): Payer: PPO

## 2017-03-17 ENCOUNTER — Encounter (INDEPENDENT_AMBULATORY_CARE_PROVIDER_SITE_OTHER): Payer: Self-pay | Admitting: Orthopaedic Surgery

## 2017-03-17 ENCOUNTER — Ambulatory Visit (INDEPENDENT_AMBULATORY_CARE_PROVIDER_SITE_OTHER): Payer: PPO | Admitting: Orthopaedic Surgery

## 2017-03-17 VITALS — BP 192/90 | HR 62 | Temp 97.7°F | Ht 61.0 in | Wt 142.0 lb

## 2017-03-17 DIAGNOSIS — S32040D Wedge compression fracture of fourth lumbar vertebra, subsequent encounter for fracture with routine healing: Secondary | ICD-10-CM

## 2017-03-17 NOTE — Progress Notes (Signed)
Office Visit Note   Patient: Kathleen Morales           Date of Birth: 1929-07-11           MRN: [id number] Visit Date: 03/17/2017              Requested by: [name], PA-C [address] [place], [place] [address] PCP: [name], PA-C   Assessment & Plan: Visit Diagnoses:  1. Closed compression fracture of L4 lumbar vertebra with routine healing, subsequent encounter     Plan: Patient stable L4 compression fracture noted further collapse since July x-rays and comparison today's x-rays. With her lower extremity swelling will obtain a Doppler rule out DVT. Also follow-up 1 month.  Follow-Up Instructions: No Follow-up on file.   Orders:  Orders Placed This Encounter  Procedures  . XR Lumbar Spine 1 View   No orders of the defined types were placed in this encounter.     Procedures: No procedures performed   Clinical Data: No additional findings.   Subjective: Chief Complaint  Patient presents with  . Lower Back - Pain    HPI 83 year old female returns for one-month follow-up of compression fracture L4. She is taking 2-1/2 mg of prednisone daily .Still has some low back pain states she  is much better than last month.  Review of Systems unchanged from 01/31/17 other than as mentioned in history of present illness   Objective: Vital Signs: BP (!) 192/90 (BP Location: Left Arm, Patient Position: Sitting, Cuff Size: Normal)   Pulse 62   Temp 97.7 F (36.5 C) (Oral)   Ht 5' 1"  (1.549 m)   Wt 142 lb (64.4 kg)   BMI 26.83 kg/m   Physical Exam  Constitutional: She is oriented to person, place, and time. She appears well-developed.  HENT:  Head: Normocephalic.  Right Ear: External ear normal.  Left Ear: External ear normal.  Eyes: Pupils are equal, round, and reactive to light.  Neck: No tracheal deviation present. No thyromegaly present.  Cardiovascular: Normal rate.   Pulmonary/Chest: Effort normal.  Abdominal: Soft.  Neurological: She is alert and  oriented to person, place, and time.  Skin: Skin is warm and dry.  Psychiatric: She has a normal mood and affect. Her behavior is normal.    Ortho Exam patient has increased swelling in her left lower extremity. She does have some superficial varicosities. She is moving better going from sitting to standing position. She starts tenderness of the trochanters. No pain with internal/external rotation of her hips.  Specialty Comments:  No specialty comments available.  Imaging: Xr Lumbar Spine 1 View  Result Date: 03/17/2017 AP lateral lumbar spine x-rays demonstrate the L4 compression fracture with no further collapse compared to MRI scan done in July. Impression: L4 compression fracture stable no other new compression fractures seen    PMFS History: Patient Active Problem List   Diagnosis Date Noted  . Gait abnormality 11/26/2016  . Left leg swelling 11/26/2016  . Abnormality of gait 10/24/2015  . Right lumbar radiculopathy 09/26/2015  . Osteoarthritis of right knee 05/04/2014  . S/P total knee replacement using cement 05/02/2014  . Syncope 03/24/2013  . Dizziness 03/24/2013  . Elevated blood pressure 03/24/2013  . Osteoarthritis of knee 04/05/2012  . Preoperative clearance 03/17/2012  . Varicose veins 03/17/2011  . Claudication 03/17/2011  . Dyspnea 03/17/2011  . POLYMYALGIA RHEUMATICA 11/15/2009  . ULCERATIVE COLITIS-LEFT SIDE 08/15/2008  . PERSONAL HX COLONIC POLYPS 08/15/2008   Past Medical  History:  Diagnosis Date  . Arthritis    takes Prednisone daily for inflammation  . Bruises easily   . Diverticulosis   . Fibromyalgia   . Gastric ulcer    hx of ;many yrs ago  . Hemorrhoids   . History of [place] spotted fever    Elevated titer 04/2006  . Hoarseness    pt states always like this;ENT can't find anything wrong  . Hx of colonic polyps   . Joint pain   . Joint swelling   . Leg pain   . Nocturia   . Palpitations   . Polymyalgia rheumatica ([place])   .  Spine disorder    has had injections and wears a brace  . 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 Ulcerative colitis 1970   takes Colazal bid    Family History  Problem Relation Age of Onset  . Heart disease Mother   . Heart disease Father   . Breast cancer Sister   . Breast cancer Daughter   . Breast cancer Other        neice  . Colon polyps Other        nephew  . Colon cancer Neg Hx   . Rectal cancer Neg Hx   . Stomach cancer Neg Hx   . Esophageal cancer Neg Hx     Past Surgical History:  Procedure Laterality Date  . ABDOMINAL HYSTERECTOMY    . CARPAL TUNNEL RELEASE     bilateral  . CATARACT EXTRACTION, BILATERAL    . COLONOSCOPY    . CYSTECTOMY     Breast x 2-Right  . epidural injections     x 2   . PARTIAL HYSTERECTOMY     at age 62  . TOTAL KNEE ARTHROPLASTY  04/06/2012   Procedure: TOTAL KNEE ARTHROPLASTY;  Surgeon: [name], MD;  Location: [facility];  Service: Orthopedics;  Laterality: Left;  . TOTAL KNEE ARTHROPLASTY Right 05/02/2014   Procedure: TOTAL KNEE ARTHROPLASTY;  Surgeon: [name], MD;  Location: [facility];  Service: Orthopedics;  Laterality: Right;  . TUBAL LIGATION     Social History   Occupational History  . Retired Retired   Social History Main Topics  . Smoking status: Never Smoker  . Smokeless tobacco: Never Used  . Alcohol use No  . Drug use: No  . Sexual activity: No

## 2017-03-18 ENCOUNTER — Ambulatory Visit
Admission: RE | Admit: 2017-03-18 | Discharge: 2017-03-18 | Disposition: A | Discharge status: Home / Self Care | Payer: PPO | Source: Ambulatory Visit | Attending: Orthopaedic Surgery | Admitting: Orthopaedic Surgery

## 2017-03-18 DIAGNOSIS — S32040D Wedge compression fracture of fourth lumbar vertebra, subsequent encounter for fracture with routine healing: Secondary | ICD-10-CM

## 2017-03-18 DIAGNOSIS — R600 Localized edema: Secondary | ICD-10-CM | POA: Diagnosis not present

## 2017-03-19 NOTE — Progress Notes (Signed)
I called patient and advised. 

## 2017-03-31 ENCOUNTER — Telehealth (INDEPENDENT_AMBULATORY_CARE_PROVIDER_SITE_OTHER): Payer: Self-pay | Admitting: Radiology

## 2017-03-31 NOTE — Telephone Encounter (Signed)
Yes OK to work in sooner if you can

## 2017-03-31 NOTE — Telephone Encounter (Signed)
She would like a call back regarding her back, and she is having and increased amount of pain.  She wants her appt before 04/15/17. Please call patient back to discuss.

## 2017-03-31 NOTE — Telephone Encounter (Signed)
Please advise, would you like for me to try and work patient in sooner?

## 2017-04-01 NOTE — Telephone Encounter (Signed)
Work in appt made for patient.

## 2017-04-03 ENCOUNTER — Ambulatory Visit (INDEPENDENT_AMBULATORY_CARE_PROVIDER_SITE_OTHER): Payer: PPO | Admitting: Orthopaedic Surgery

## 2017-04-03 ENCOUNTER — Ambulatory Visit (INDEPENDENT_AMBULATORY_CARE_PROVIDER_SITE_OTHER): Payer: PPO

## 2017-04-03 ENCOUNTER — Encounter (INDEPENDENT_AMBULATORY_CARE_PROVIDER_SITE_OTHER): Payer: Self-pay | Admitting: Orthopaedic Surgery

## 2017-04-03 VITALS — BP 174/69 | HR 73

## 2017-04-03 DIAGNOSIS — S32040D Wedge compression fracture of fourth lumbar vertebra, subsequent encounter for fracture with routine healing: Secondary | ICD-10-CM

## 2017-04-03 DIAGNOSIS — G8929 Other chronic pain: Secondary | ICD-10-CM | POA: Diagnosis not present

## 2017-04-03 DIAGNOSIS — M25512 Pain in left shoulder: Secondary | ICD-10-CM | POA: Diagnosis not present

## 2017-04-03 MED ORDER — TRAMADOL HCL 50 MG PO TABS: 50.0000 mg | ORAL_TABLET | Freq: Two times a day (BID) | ORAL | 1 refills | Status: DC | PRN

## 2017-04-03 NOTE — Progress Notes (Signed)
Office Visit Note   Patient: Kathleen Morales           Date of Birth: 09/11/1928           MRN: [id number] Visit Date: 04/03/2017              Requested by: [name], PA-C [address] [place], [place] [address]            PCP: [name], PA-C   Assessment & Plan: Visit Diagnoses:  1. Closed compression fracture of L4 lumbar vertebra with routine healing, subsequent encounter   2. Chronic left shoulder pain     Plan: Injection performed in her shoulder which gave her some relief. Reviewed x-ray results and suggest she likely has some supraspinatus complete tearing with high riding humeral head. Tramadol for pain. She understands she can have some increased pains expected now that she weaned off the prednisone.  Follow-Up Instructions: No Follow-up on file.   Orders:  Orders Placed This Encounter  Procedures  . XR Lumbar Spine 2-3 Views  . XR Shoulder Left   Meds ordered this encounter  Medications  . traMADol (ULTRAM) 50 MG tablet    Sig: Take 1 tablet (50 mg total) by mouth every 12 (twelve) hours as needed.    Dispense:  40 tablet    Refill:  1      Procedures: Large Joint Inj Date/Time: 04/06/2017 10:28 AM Performed by: Marybelle Killings Authorized by: Rodell Perna C   Consent Given by:  Patient Site marked: the procedure site was marked   Indications:  Pain Location:  Shoulder Site:  L subacromial bursa Needle Size:  22 G Needle Length:  1.5 inches Ultrasound Guidance: No   Fluoroscopic Guidance: No   Arthrogram: No   Medications:  1 mL lidocaine 1 %; 40 mg methylPREDNISolone acetate 40 MG/ML; 4 mL bupivacaine 0.25 % Patient tolerance:  Patient tolerated the procedure well with no immediate complications     Clinical Data: No additional findings.   Subjective: Chief Complaint  Patient presents with  . Lower Back - Pain  . Left Shoulder - Pain    HPI  83 year old female seen with the some recent increased back pain. She is weaning down  on her prednisone and is now having more symptoms. She been on long-term prednisone gradually weaned down is now having increased pain. She has some swelling in her lower extremities wearing compression O's and seems to help swelling goes down with elevation. Venous Dopplers were negative on 03/18/2017.  Review of Systems 14 point review of systems updated and is unchanged from 01/31/2017 noted and is mentioned in history of present illness.   Objective: Vital Signs: BP (!) 174/69   Pulse 73   Physical Exam  Constitutional: She is oriented to person, place, and time. She appears well-developed.  HENT:  Head: Normocephalic.  Right Ear: External ear normal.  Left Ear: External ear normal.  Eyes: Pupils are equal, round, and reactive to light.  Neck: No tracheal deviation present. No thyromegaly present.  Cardiovascular: Normal rate.   Pulmonary/Chest: Effort normal.  Abdominal: Soft.  Neurological: She is alert and oriented to person, place, and time.  Skin: Skin is warm and dry.  Psychiatric: She has a normal mood and affect. Her behavior is normal.    Ortho Exam patient is a swallowing lower extremity she slowed doing things standing position has some weakness with supraspinatus testing on the left as well as right. No subluxation  of the left shoulder. Acromioclavicular joint is minimally tender. Long head of the biceps is mildly tender. Anterior tib gastrocsoleus are active and take decent resistance without focal weakness.  Specialty Comments:  No specialty comments available.  Imaging:    PMFS History: Patient Active Problem List   Diagnosis Date Noted  . Closed compression fracture of L4 lumbar vertebra ([place]) 04/06/2017  . Gait abnormality 11/26/2016  . Left leg swelling 11/26/2016  . Abnormality of gait 10/24/2015  . Right lumbar radiculopathy 09/26/2015  . Osteoarthritis of right knee 05/04/2014  . S/P total knee replacement using cement 05/02/2014  . Syncope 03/24/2013   . Dizziness 03/24/2013  . Elevated blood pressure 03/24/2013  . Osteoarthritis of knee 04/05/2012  . Preoperative clearance 03/17/2012  . Varicose veins 03/17/2011  . Claudication 03/17/2011  . Dyspnea 03/17/2011  . POLYMYALGIA RHEUMATICA 11/15/2009  . ULCERATIVE COLITIS-LEFT SIDE 08/15/2008  . PERSONAL HX COLONIC POLYPS 08/15/2008   Past Medical History:  Diagnosis Date  . Arthritis    takes Prednisone daily for inflammation  . Bruises easily   . Diverticulosis   . Fibromyalgia   . Gastric ulcer    hx of ;many yrs ago  . Hemorrhoids   . History of [place] spotted fever    Elevated titer 04/2006  . Hoarseness    pt states always like this;ENT can't find anything wrong  . Hx of colonic polyps   . Joint pain   . Joint swelling   . Leg pain   . Nocturia   . Palpitations   . Polymyalgia rheumatica ([place])   . Spine disorder    has had injections and wears a brace  . 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 Ulcerative colitis 1970   takes Colazal bid    Family History  Problem Relation Age of Onset  . Heart disease Mother   . Heart disease Father   . Breast cancer Sister   . Breast cancer Daughter   . Breast cancer Other        neice  . Colon polyps Other        nephew  . Colon cancer Neg Hx   . Rectal cancer Neg Hx   . Stomach cancer Neg Hx   . Esophageal cancer Neg Hx     Past Surgical History:  Procedure Laterality Date  . ABDOMINAL HYSTERECTOMY    . CARPAL TUNNEL RELEASE     bilateral  . CATARACT EXTRACTION, BILATERAL    . COLONOSCOPY    . CYSTECTOMY     Breast x 2-Right  . epidural injections     x 2   . PARTIAL HYSTERECTOMY     at age 39  . TOTAL KNEE ARTHROPLASTY  04/06/2012   Procedure: TOTAL KNEE ARTHROPLASTY;  Surgeon: [name], MD;  Location: [facility];  Service: Orthopedics;  Laterality: Left;  . TOTAL KNEE ARTHROPLASTY Right 05/02/2014   Procedure: TOTAL KNEE ARTHROPLASTY;  Surgeon: [name], MD;  Location: [facility];   Service: Orthopedics;  Laterality: Right;  . TUBAL LIGATION     Social History   Occupational History  . Retired Retired   Social History Main Topics  . Smoking status: Never Smoker  . Smokeless tobacco: Never Used  . Alcohol use No  . Drug use: No  . Sexual activity: No

## 2017-04-06 DIAGNOSIS — S32040A Wedge compression fracture of fourth lumbar vertebra, initial encounter for closed fracture: Secondary | ICD-10-CM | POA: Insufficient documentation

## 2017-04-06 DIAGNOSIS — I10 Essential (primary) hypertension: Secondary | ICD-10-CM | POA: Diagnosis not present

## 2017-04-06 DIAGNOSIS — M25512 Pain in left shoulder: Secondary | ICD-10-CM

## 2017-04-06 DIAGNOSIS — G8929 Other chronic pain: Secondary | ICD-10-CM | POA: Diagnosis not present

## 2017-04-06 DIAGNOSIS — M545 Low back pain: Secondary | ICD-10-CM | POA: Diagnosis not present

## 2017-04-06 DIAGNOSIS — Z6826 Body mass index (BMI) 26.0-26.9, adult: Secondary | ICD-10-CM | POA: Diagnosis not present

## 2017-04-06 MED ORDER — METHYLPREDNISOLONE ACETATE 40 MG/ML IJ SUSP
40.0000 mg | INTRAMUSCULAR | Status: AC | PRN
  Administered 2017-04-06: 40 mg via INTRA_ARTICULAR

## 2017-04-06 MED ORDER — LIDOCAINE HCL 1 % IJ SOLN
1.0000 mL | INTRAMUSCULAR | Status: AC | PRN
  Administered 2017-04-06: 1 mL

## 2017-04-06 MED ORDER — BUPIVACAINE HCL 0.25 % IJ SOLN
4.0000 mL | INTRAMUSCULAR | Status: AC | PRN
  Administered 2017-04-06: 4 mL via INTRA_ARTICULAR

## 2017-04-13 ENCOUNTER — Encounter: Payer: Self-pay | Admitting: Neurology

## 2017-04-13 ENCOUNTER — Telehealth: Payer: Self-pay | Admitting: Neurology

## 2017-04-13 ENCOUNTER — Ambulatory Visit
Admission: RE | Admit: 2017-04-13 | Discharge: 2017-04-13 | Disposition: A | Discharge status: Home / Self Care | Payer: PPO | Source: Ambulatory Visit | Attending: Neurology | Admitting: Neurology

## 2017-04-13 ENCOUNTER — Ambulatory Visit (INDEPENDENT_AMBULATORY_CARE_PROVIDER_SITE_OTHER): Payer: PPO | Admitting: Neurology

## 2017-04-13 VITALS — BP 142/66 | HR 72 | Ht 61.0 in | Wt 140.0 lb

## 2017-04-13 DIAGNOSIS — M25552 Pain in left hip: Principal | ICD-10-CM

## 2017-04-13 DIAGNOSIS — M5416 Radiculopathy, lumbar region: Secondary | ICD-10-CM | POA: Diagnosis not present

## 2017-04-13 DIAGNOSIS — G8929 Other chronic pain: Secondary | ICD-10-CM | POA: Diagnosis not present

## 2017-04-13 DIAGNOSIS — M1612 Unilateral primary osteoarthritis, left hip: Secondary | ICD-10-CM | POA: Diagnosis not present

## 2017-04-13 MED ORDER — GABAPENTIN 300 MG PO CAPS: 300.0000 mg | ORAL_CAPSULE | Freq: Three times a day (TID) | ORAL | 11 refills | Status: DC

## 2017-04-13 NOTE — Progress Notes (Signed)
PATIENT: Kathleen Morales DOB: October 16, 1928  Chief Complaint  Patient presents with  . Back Pain    She is here with her significant other, Kathleen Morales, for worsening of her low back pain.  Pain radiating into left hip and down leg to her thigh.  She has a prescription for gabapentin 114m 2 tablets TID.  She reports taking gabapentin 7069mthis morning due to her severe discomfort.     HISTORICAL  [name] [age]ears old right-handed female, company by her housemate Kathleen Materseen in refer by Kathleen Merinofor evaluation of gait difficulty, low back pain, radiating pain to bilateral lower extremity, her primary care is [place] evaluation was on September 26 2015.  She had a history of polymyalgia rheumatica since 2007, has been treated with low-dose prednisone, osteoarthritis, history of ulcerative colitis, bilateral knee replacement  She had chronic low back pain, progressive worsened since 2015, she noticed radiating pain down the right lateral thigh, numbness achiness at right lateral leg, she also had a gradual worsening gait difficulty, poor balance, she continues to have multiple joints pain, despite bilateral knee replacement, she also complains of significant right shoulder pain, limited range of motion of her right shoulder, cracking sound of her neck when she turns around  She denies bowel and bladder incontinence, she has no bilateral hands paresthesia or weakness, she complains of bilateral plantar feet numbness tingling since 2016.  Update October 24 2015: She returned for electrodiagnostic study today, which demonstrate mild bilateral lumbosacral radiculopathy, mainly involving bilateral L4, L5, S1 myotomes.  We also reviewed MRI of lumbar without contrast together in March 2017: multilevel degenerative changes as detailed above. The most significant findings are: 1. At L3-L4 there is minimal anterolisthesis of L3 upon L4 due to severe facet hypertrophy  there is narrowing of the central canal with transverse diameter and moderate right lateral recess stenosis. Does not appear to be nerve root compression. 2. At L4-L5 there is lateral spondylolisthesis of L4 upon L5 leading to extensive protrusion of the uncovered disc on the right and facet hypertrophy. Severe right foraminal stenosis and moderately severe right lateral recess stenosis. There is probable right L4 possible right L5 nerve root compression. 3. At L5-S1 there are degenerative changes as a moderately severe left foraminal narrowing that could lead to left L5 nerve root compression. 4. There are milder degenerative changes at the other levels as described above.  She has moderate to severe right-sided low back pain, radiating pain to right lower extremity, gabapentin 100 mg 3 times a day has been helpful, she reported 50% improvement of her pain, no significant side effect notice, she denies bowel and bladder incontinence, the most bothersome symptoms is her slow worsening gait difficulty.  UPDATE Nov 26 2016: Her right-sided low back pain has much improved with gabapentin 100 mg 2 tablets 3 times a day We have personally reviewed MRI of lumbar spine in March 2017, multilevel degenerative disc disease, scoliosis that is convex to the left in the lumbar spine, at L4-5, there is severe right foraminal stenosis, possible right L5 nerve root compression.  She also complains of left leg swelling, pain, reported previous Doppler study failed to demonstrate left lower extremity DVT.  She ambulate without walker, mildly unsteady, denies bowel and bladder incontinence.  UPDATE Apr 13 2017: She is accompanied by her significant other is at today's visit complains of worsening left hip pain since September twelfth 2018, without clear triggers, difficulty walking,  right-sided low back pain was helped by gabapentin 100 mg 2 tablets 3 times a day  REVIEW OF SYSTEMS: Full 14 system review of  systems performed and notable only for as above  ALLERGIES: Allergies  Allergen Reactions  . Xarelto [Rivaroxaban] Swelling    Legs turned black    HOME MEDICATIONS: Current Outpatient Prescriptions  Medication Sig Dispense Refill  . balsalazide (COLAZAL) 750 MG capsule TAKE 3 CAPSULES BY MOUTH 3 TIMES DAILY 270 capsule 5  . Biotin 1000 MCG tablet Take 1,000 mcg by mouth daily.    . Cholecalciferol (VITAMIN D3) 2000 UNITS TABS Take 2,000 Units by mouth daily.    Marland Kitchen gabapentin (NEURONTIN) 100 MG capsule Take 2 capsules (200 mg total) by mouth 3 (three) times daily. Take 2 capsules by mouth 3 times daily.  Please call [phone] to schedule an appt for continued refills. 540 capsule 4  . omeprazole (PRILOSEC) 40 MG capsule Take 1 capsule (40 mg total) by mouth daily. 90 capsule 3  . traMADol (ULTRAM) 50 MG tablet Take 1 tablet (50 mg total) by mouth every 12 (twelve) hours as needed. 40 tablet 1  . vitamin B-12 (CYANOCOBALAMIN) 1000 MCG tablet Take 1,000 mcg by mouth daily.    . metoprolol tartrate (LOPRESSOR) 25 MG tablet Take 1 tablet (25 mg total) by mouth 2 (two) times daily. 180 tablet 3   Current Facility-Administered Medications  Medication Dose Route Frequency Provider Last Rate Last Dose  . 0.9 %  sodium chloride infusion  500 mL Intravenous Continuous Kathleen Artist, MD        PAST MEDICAL HISTORY: Past Medical History:  Diagnosis Date  . Arthritis    takes Prednisone daily for inflammation  . Bruises easily   . Diverticulosis   . Fibromyalgia   . Gastric ulcer    hx of ;many yrs ago  . Hemorrhoids   . History of [place] spotted fever    Elevated titer 04/2006  . Hoarseness    pt states always like this;ENT can't find anything wrong  . Hx of colonic polyps   . Joint pain   . Joint swelling   . Leg pain   . Nocturia   . Palpitations   . Polymyalgia rheumatica ([place])   . Spine disorder    has had injections and wears a brace  . Tubulovillous adenoma polyp  of colon 07/1996  . Ulcerative colitis 1970   takes Colazal bid    PAST SURGICAL HISTORY: Past Surgical History:  Procedure Laterality Date  . ABDOMINAL HYSTERECTOMY    . CARPAL TUNNEL RELEASE     bilateral  . CATARACT EXTRACTION, BILATERAL    . COLONOSCOPY    . CYSTECTOMY     Breast x 2-Right  . epidural injections     x 2   . PARTIAL HYSTERECTOMY     at age 28  . TOTAL KNEE ARTHROPLASTY  04/06/2012   Procedure: TOTAL KNEE ARTHROPLASTY;  Surgeon: [name], MD;  Location: [place];  Service: Orthopedics;  Laterality: Left;  . TOTAL KNEE ARTHROPLASTY Right 05/02/2014   Procedure: TOTAL KNEE ARTHROPLASTY;  Surgeon: [name], MD;  Location: [place];  Service: Orthopedics;  Laterality: Right;  . TUBAL LIGATION      FAMILY HISTORY: Family History  Problem Relation Age of Onset  . Heart disease Mother   . Heart disease Father   . Breast cancer Sister   . Breast cancer Daughter   . Breast cancer Other  neice  . Colon polyps Other        nephew  . Colon cancer Neg Hx   . Rectal cancer Neg Hx   . Stomach cancer Neg Hx   . Esophageal cancer Neg Hx     SOCIAL HISTORY:  Social History   Social History  . Marital status: Widowed    Spouse name: N/A  . Number of children: 4  . Years of education: HS   Occupational History  . Retired Retired   Social History Main Topics  . Smoking status: Never Smoker  . Smokeless tobacco: Never Used  . Alcohol use No  . Drug use: No  . Sexual activity: No   Other Topics Concern  . Not on file   Social History Narrative   Daily caffeine use 1-2 cup/day   Gets regular exercise   Right-handed.   Lives with a friend, Tera Morales.           PHYSICAL EXAM   Vitals:   04/13/17 1202  BP: (!) 142/66  Pulse: 72  Weight: 140 lb (63.5 kg)  Height: 5' 1"  (1.549 m)    Not recorded      Body mass index is 26.45 kg/m.  PHYSICAL EXAMNIATION:  Gen: NAD, conversant, well nourised, obese, well groomed                      Cardiovascular: Regular rate rhythm, no peripheral edema, warm, nontender. Eyes: Conjunctivae clear without exudates or hemorrhage Neck: Supple, no carotid bruise. Pulmonary: Clear to auscultation bilaterally   NEUROLOGICAL EXAM:  MENTAL STATUS: Speech:    Speech is normal; fluent and spontaneous with normal comprehension.  Cognition:     Orientation to time, place and person     Normal recent and remote memory     Normal Attention span and concentration     Normal Language, naming, repeating,spontaneous speech     Fund of knowledge   CRANIAL NERVES: CN II: Visual fields are full to confrontation. Fundoscopic exam is normal with sharp discs and no vascular changes. Pupils are round equal and briskly reactive to light. CN III, IV, VI: extraocular movement are normal. No ptosis. CN V: Facial sensation is intact to pinprick in all 3 divisions bilaterally. Corneal responses are intact.  CN VII: Face is symmetric with normal eye closure and smile. CN VIII: Hearing is normal to rubbing fingers CN IX, X: Palate elevates symmetrically. Phonation is normal. CN XI: Head turning and shoulder shrug are intact CN XII: Tongue is midline with normal movements and no atrophy.  MOTOR: Limited range of motion of right shoulder, there is no significant bilateral upper extremity proximal or distal weakness, there is no significant bilateral lower extremity proximal weakness, With exception of mild bilateral toe extension weakness, left hip pain on cross leg testing,   REFLEXES: Reflexes are 1 and symmetric at the biceps, triceps, absent at knees, and ankles. Plantar responses are flexor.  SENSORY: Mildly length dependent decreased to light touch, pinprick and vibratory sensation.  COORDINATION: Rapid alternating movements and fine finger movements are intact. There is no dysmetria on finger-to-nose and heel-knee-shin.    GAIT/STANCE: She needs to push up on the chair to get up from  seated position, limp, antaglic  DIAGNOSTIC DATA (LABS, IMAGING, TESTING) - I reviewed patient records, labs, notes, testing and imaging myself where available.   ASSESSMENT AND PLAN  ADELYNE Morales is a 83 y.o. female   Chronic low back pain, radiating  pain to right leg,   Significant multilevel lumbar degenerative disc disease, with variable degree of foraminal stenosis at different levels, most severe is lateral spondylolisthesis of L4 on L5, leading to extensive protrusion of the uncovered disc on the right and facet hypertrophy, severe right foraminal stenosis, with possible right L4, L5 nerve root compression.  Start gabapentin 348m 3 times a day,  Gait difficulty  Multifactorial, joints pain, distal leg weakness, limited range of motion of right shoulder  Continue moderate exercise  Left lower extremity swelling, tenderness upon deep palpitation  Negative for DVT in August 2018.   YMarcial Pacas M.D. Ph.D.  [facility] [address] [place] [address]Ph: [phone]: [phone] CC: SBo Merino MD. PCP NCyndi Bender PA-C

## 2017-04-13 NOTE — Telephone Encounter (Signed)
Please call patient, x-ray of left hip showed chronic degenerative changes, there was no acute abnormality,  She should still follow up with her orthopedic surgeon for treatment,  Pelvic ring is intact. No acute fracture or dislocation is noted. No soft tissue changes are seen. Degenerative change of the lumbar spine is noted with a mild scoliosis concave to the right. Aortic calcifications are noted as well.  IMPRESSION: Degenerative changes without acute abnormality.

## 2017-04-13 NOTE — Telephone Encounter (Signed)
Spoke to patient - she is aware of results.  She has a pending appt with Dr. Lorin Mercy at [facility] on 04/15/17.

## 2017-04-13 NOTE — Telephone Encounter (Addendum)
error 

## 2017-04-15 ENCOUNTER — Telehealth (INDEPENDENT_AMBULATORY_CARE_PROVIDER_SITE_OTHER): Payer: Self-pay | Admitting: Orthopaedic Surgery

## 2017-04-15 ENCOUNTER — Ambulatory Visit (INDEPENDENT_AMBULATORY_CARE_PROVIDER_SITE_OTHER): Payer: PPO | Admitting: Orthopaedic Surgery

## 2017-04-15 ENCOUNTER — Encounter (INDEPENDENT_AMBULATORY_CARE_PROVIDER_SITE_OTHER): Payer: Self-pay | Admitting: Orthopaedic Surgery

## 2017-04-15 VITALS — BP 139/69 | HR 63

## 2017-04-15 DIAGNOSIS — S32040D Wedge compression fracture of fourth lumbar vertebra, subsequent encounter for fracture with routine healing: Secondary | ICD-10-CM

## 2017-04-15 DIAGNOSIS — M7062 Trochanteric bursitis, left hip: Secondary | ICD-10-CM

## 2017-04-15 NOTE — Telephone Encounter (Signed)
Patient came in stating a brace was ordered for her and she no longer needs it. Insurance had prev denied paying for it. Please send it back.

## 2017-04-15 NOTE — Progress Notes (Signed)
Office Visit Note   Patient: Kathleen Morales           Date of Birth: [birth year]           MRN: [id number] Visit Date: 04/15/2017              Requested by: [name], PA-C [address] [place], [place] [address] PCP: [name], PA-C   Assessment & Plan: Visit Diagnoses:  1. Closed compression fracture of L4 lumbar vertebra with routine healing, subsequent encounter   2. Trochanteric bursitis, left hip   3. Osteoporosis from long term prednisone use.   Plan: Trochanteric injection performed with improvement in pain she is walking better postinjection. She will return if she does not get the progressive improvement. We discussed gradually weaning her lumbar corset.  Follow-Up Instructions: Return if symptoms worsen or fail to improve.   Orders:  Orders Placed This Encounter  Procedures  . Large Joint Injection/Arthrocentesis   No orders of the defined types were placed in this encounter.     Procedures: Large Joint Inj Date/Time: 04/15/2017 11:36 AM Performed by: Marybelle Killings Authorized by: Rodell Perna C   Location:  Hip Site:  L greater trochanter Ultrasound Guidance: No   Fluoroscopic Guidance: No   Arthrogram: No   Medications:  0.5 mL lidocaine 1 %; 2 mL lidocaine (PF) 1 %; 40 mg methylPREDNISolone acetate 40 MG/ML Aspiration Attempted: No       Clinical Data: No additional findings.   Subjective: Chief Complaint  Patient presents with  . Left Hip - Pain  . Lower Back - Follow-up    HPI 83 year old female returns with ongoing pain in her left shoulder that had an injection 04/03/2017 with some relief but persistent pain. She also has continued pain in her back but primarily laterally over her left trochanteric region. She's used tramadol with some relief. L4 compression fracture has been stable with no further compression and previous fractures at L2 and L5. Recent hip x-rays demonstrated some degenerative changes of the lumbar spine with mild  scoliosis and aortic calcification. No pelvic fractures were noted. These x-rays were done on 04/13/2017. She is taking gabapentin. She had been on a prednisone wean initially decreased the prednisone and stop she had increased pain. Patient has been wearing a lumbar corset which seems to help.  Review of Systems 14 point review of systems updated unchanged from 01/31/2017 noted other than as mentioned in history of present illness above. Positive for polymyalgia rheumatica.   Objective: Vital Signs: BP 139/69   Pulse 63   Physical Exam  Constitutional: She is oriented to person, place, and time. She appears well-developed.  HENT:  Head: Normocephalic.  Right Ear: External ear normal.  Left Ear: External ear normal.  Eyes: Pupils are equal, round, and reactive to light.  Neck: No tracheal deviation present. No thyromegaly present.  Cardiovascular: Normal rate.   Pulmonary/Chest: Effort normal.  Abdominal: Soft.  Neurological: She is alert and oriented to person, place, and time.  Skin: Skin is warm and dry.  Psychiatric: She has a normal mood and affect. Her behavior is normal.    Ortho Exam patient gets slowly from sitting to standing position. She is ambulatory and has good resistance to anterior tib and gastrocsoleus testing. No pain with internal or external rotation of her hips. She has exquisite tenderness of the left greater trochanter. Minimal tenderness over the right greater trochanter.  Specialty Comments:  No specialty comments available.  Imaging:  No results found.   PMFS History: Patient Active Problem List   Diagnosis Date Noted  . Trochanteric bursitis, left hip 04/15/2017  . Hip pain, chronic, left 04/13/2017  . Closed compression fracture of L4 lumbar vertebra ([place]) 04/06/2017  . Gait abnormality 11/26/2016  . Left leg swelling 11/26/2016  . Abnormality of gait 10/24/2015  . Right lumbar radiculopathy 09/26/2015  . Osteoarthritis of right knee 05/04/2014  .  S/P total knee replacement using cement 05/02/2014  . Syncope 03/24/2013  . Dizziness 03/24/2013  . Elevated blood pressure 03/24/2013  . Osteoarthritis of knee 04/05/2012  . Preoperative clearance 03/17/2012  . Varicose veins 03/17/2011  . Claudication 03/17/2011  . Dyspnea 03/17/2011  . POLYMYALGIA RHEUMATICA 11/15/2009  . ULCERATIVE COLITIS-LEFT SIDE 08/15/2008  . PERSONAL HX COLONIC POLYPS 08/15/2008   Past Medical History:  Diagnosis Date  . Arthritis    takes Prednisone daily for inflammation  . Bruises easily   . Diverticulosis   . Fibromyalgia   . Gastric ulcer    hx of ;many yrs ago  . Hemorrhoids   . History of [place] spotted fever    Elevated titer 04/2006  . Hoarseness    pt states always like this;ENT can't find anything wrong  . Hx of colonic polyps   . Joint pain   . Joint swelling   . Leg pain   . Nocturia   . Palpitations   . Polymyalgia rheumatica ([place])   . Spine disorder    has had injections and wears a brace  . 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 Ulcerative colitis 1970   takes Colazal bid    Family History  Problem Relation Age of Onset  . Heart disease Mother   . Heart disease Father   . Breast cancer Sister   . Breast cancer Daughter   . Breast cancer Other        neice  . Colon polyps Other        nephew  . Colon cancer Neg Hx   . Rectal cancer Neg Hx   . Stomach cancer Neg Hx   . Esophageal cancer Neg Hx     Past Surgical History:  Procedure Laterality Date  . ABDOMINAL HYSTERECTOMY    . CARPAL TUNNEL RELEASE     bilateral  . CATARACT EXTRACTION, BILATERAL    . COLONOSCOPY    . CYSTECTOMY     Breast x 2-Right  . epidural injections     x 2   . PARTIAL HYSTERECTOMY     at age 74  . TOTAL KNEE ARTHROPLASTY  04/06/2012   Procedure: TOTAL KNEE ARTHROPLASTY;  Surgeon: [name], MD;  Location: [place] [place];  Service: Orthopedics;  Laterality: Left;  . TOTAL KNEE ARTHROPLASTY Right 05/02/2014   Procedure:  TOTAL KNEE ARTHROPLASTY;  Surgeon: [name], MD;  Location: [place];  Service: Orthopedics;  Laterality: Right;  . TUBAL LIGATION     Social History   Occupational History  . Retired Retired   Social History Main Topics  . Smoking status: Never Smoker  . Smokeless tobacco: Never Used  . Alcohol use No  . Drug use: No  . Sexual activity: No

## 2017-04-16 NOTE — Telephone Encounter (Signed)
See below, can you look into this?  Thanks.

## 2017-04-16 NOTE — Telephone Encounter (Signed)
This is the patient that I spoke with you about in regards to her lumbar corset and insurance denying to pay the $325 for it.  Dr. Lorin Mercy states that the insurance should cover this corset because she has a compression fracture and wanted me to send message back to you to see if you can appeal it?  Please let me know if there is something I can do to help.

## 2017-04-20 MED ORDER — LIDOCAINE HCL 1 % IJ SOLN
0.5000 mL | INTRAMUSCULAR | Status: AC | PRN
  Administered 2017-04-15: .5 mL

## 2017-04-20 MED ORDER — LIDOCAINE HCL (PF) 1 % IJ SOLN
2.0000 mL | INTRAMUSCULAR | Status: AC | PRN
  Administered 2017-04-15: 2 mL

## 2017-04-20 MED ORDER — METHYLPREDNISOLONE ACETATE 40 MG/ML IJ SUSP
40.0000 mg | INTRAMUSCULAR | Status: AC | PRN
  Administered 2017-04-15: 40 mg via INTRA_ARTICULAR

## 2017-04-23 DIAGNOSIS — Z6825 Body mass index (BMI) 25.0-25.9, adult: Secondary | ICD-10-CM | POA: Diagnosis not present

## 2017-04-23 DIAGNOSIS — Z01419 Encounter for gynecological examination (general) (routine) without abnormal findings: Secondary | ICD-10-CM | POA: Diagnosis not present

## 2017-04-23 DIAGNOSIS — N951 Menopausal and female climacteric states: Secondary | ICD-10-CM | POA: Diagnosis not present

## 2017-05-06 DIAGNOSIS — S32000A Wedge compression fracture of unspecified lumbar vertebra, initial encounter for closed fracture: Secondary | ICD-10-CM | POA: Diagnosis not present

## 2017-05-06 DIAGNOSIS — M159 Polyosteoarthritis, unspecified: Secondary | ICD-10-CM | POA: Diagnosis not present

## 2017-05-06 DIAGNOSIS — Z6825 Body mass index (BMI) 25.0-25.9, adult: Secondary | ICD-10-CM | POA: Diagnosis not present

## 2017-05-06 DIAGNOSIS — I493 Ventricular premature depolarization: Secondary | ICD-10-CM | POA: Diagnosis not present

## 2017-05-06 DIAGNOSIS — I10 Essential (primary) hypertension: Secondary | ICD-10-CM | POA: Diagnosis not present

## 2017-05-18 DIAGNOSIS — M25552 Pain in left hip: Secondary | ICD-10-CM | POA: Diagnosis not present

## 2017-05-18 DIAGNOSIS — M545 Low back pain: Secondary | ICD-10-CM | POA: Diagnosis not present

## 2017-05-26 ENCOUNTER — Other Ambulatory Visit: Payer: Self-pay | Admitting: Physical Medicine and Rehabilitation

## 2017-05-26 DIAGNOSIS — M47818 Spondylosis without myelopathy or radiculopathy, sacral and sacrococcygeal region: Secondary | ICD-10-CM | POA: Diagnosis not present

## 2017-05-26 DIAGNOSIS — R102 Pelvic and perineal pain: Secondary | ICD-10-CM

## 2017-05-26 DIAGNOSIS — M533 Sacrococcygeal disorders, not elsewhere classified: Secondary | ICD-10-CM

## 2017-05-26 DIAGNOSIS — M7072 Other bursitis of hip, left hip: Secondary | ICD-10-CM | POA: Diagnosis not present

## 2017-05-27 ENCOUNTER — Ambulatory Visit
Admission: RE | Admit: 2017-05-27 | Discharge: 2017-05-27 | Disposition: A | Discharge status: Home / Self Care | Payer: PPO | Source: Ambulatory Visit | Attending: Physical Medicine and Rehabilitation | Admitting: Physical Medicine and Rehabilitation

## 2017-05-27 DIAGNOSIS — R102 Pelvic and perineal pain: Secondary | ICD-10-CM

## 2017-05-27 DIAGNOSIS — M533 Sacrococcygeal disorders, not elsewhere classified: Secondary | ICD-10-CM

## 2017-05-29 DIAGNOSIS — H52203 Unspecified astigmatism, bilateral: Secondary | ICD-10-CM | POA: Diagnosis not present

## 2017-05-29 DIAGNOSIS — Z961 Presence of intraocular lens: Secondary | ICD-10-CM | POA: Diagnosis not present

## 2017-06-02 ENCOUNTER — Ambulatory Visit: Payer: PPO | Admitting: Rheumatology

## 2017-06-27 ENCOUNTER — Other Ambulatory Visit: Payer: Self-pay | Admitting: Gastroenterology

## 2017-07-13 DIAGNOSIS — M47818 Spondylosis without myelopathy or radiculopathy, sacral and sacrococcygeal region: Secondary | ICD-10-CM | POA: Diagnosis not present

## 2017-07-13 DIAGNOSIS — M7072 Other bursitis of hip, left hip: Secondary | ICD-10-CM | POA: Diagnosis not present

## 2017-08-03 DIAGNOSIS — M47818 Spondylosis without myelopathy or radiculopathy, sacral and sacrococcygeal region: Secondary | ICD-10-CM | POA: Diagnosis not present

## 2017-08-03 DIAGNOSIS — M545 Low back pain: Secondary | ICD-10-CM | POA: Diagnosis not present

## 2017-08-03 DIAGNOSIS — M7072 Other bursitis of hip, left hip: Secondary | ICD-10-CM | POA: Diagnosis not present

## 2017-08-10 DIAGNOSIS — S76312D Strain of muscle, fascia and tendon of the posterior muscle group at thigh level, left thigh, subsequent encounter: Secondary | ICD-10-CM | POA: Diagnosis not present

## 2017-08-10 DIAGNOSIS — M25551 Pain in right hip: Secondary | ICD-10-CM | POA: Diagnosis not present

## 2017-08-10 DIAGNOSIS — M25552 Pain in left hip: Secondary | ICD-10-CM | POA: Diagnosis not present

## 2017-08-10 DIAGNOSIS — M7072 Other bursitis of hip, left hip: Secondary | ICD-10-CM | POA: Diagnosis not present

## 2017-08-13 DIAGNOSIS — S76312D Strain of muscle, fascia and tendon of the posterior muscle group at thigh level, left thigh, subsequent encounter: Secondary | ICD-10-CM | POA: Diagnosis not present

## 2017-08-13 DIAGNOSIS — M25551 Pain in right hip: Secondary | ICD-10-CM | POA: Diagnosis not present

## 2017-08-13 DIAGNOSIS — M25552 Pain in left hip: Secondary | ICD-10-CM | POA: Diagnosis not present

## 2017-08-13 DIAGNOSIS — M7072 Other bursitis of hip, left hip: Secondary | ICD-10-CM | POA: Diagnosis not present

## 2017-08-17 DIAGNOSIS — M7072 Other bursitis of hip, left hip: Secondary | ICD-10-CM | POA: Diagnosis not present

## 2017-08-17 DIAGNOSIS — S76312D Strain of muscle, fascia and tendon of the posterior muscle group at thigh level, left thigh, subsequent encounter: Secondary | ICD-10-CM | POA: Diagnosis not present

## 2017-08-17 DIAGNOSIS — M25552 Pain in left hip: Secondary | ICD-10-CM | POA: Diagnosis not present

## 2017-08-17 DIAGNOSIS — M25551 Pain in right hip: Secondary | ICD-10-CM | POA: Diagnosis not present

## 2017-08-20 DIAGNOSIS — M25551 Pain in right hip: Secondary | ICD-10-CM | POA: Diagnosis not present

## 2017-08-20 DIAGNOSIS — M25552 Pain in left hip: Secondary | ICD-10-CM | POA: Diagnosis not present

## 2017-08-20 DIAGNOSIS — M7072 Other bursitis of hip, left hip: Secondary | ICD-10-CM | POA: Diagnosis not present

## 2017-08-20 DIAGNOSIS — S76312D Strain of muscle, fascia and tendon of the posterior muscle group at thigh level, left thigh, subsequent encounter: Secondary | ICD-10-CM | POA: Diagnosis not present

## 2017-08-24 ENCOUNTER — Other Ambulatory Visit (INDEPENDENT_AMBULATORY_CARE_PROVIDER_SITE_OTHER): Payer: Self-pay | Admitting: Orthopaedic Surgery

## 2017-08-24 NOTE — Telephone Encounter (Signed)
Called to pharmacy 

## 2017-08-24 NOTE — Telephone Encounter (Signed)
Ok for refill? 

## 2017-08-24 NOTE — Telephone Encounter (Signed)
Ok thanks 

## 2017-08-25 DIAGNOSIS — M25552 Pain in left hip: Secondary | ICD-10-CM | POA: Diagnosis not present

## 2017-08-25 DIAGNOSIS — M7072 Other bursitis of hip, left hip: Secondary | ICD-10-CM | POA: Diagnosis not present

## 2017-08-25 DIAGNOSIS — M25551 Pain in right hip: Secondary | ICD-10-CM | POA: Diagnosis not present

## 2017-08-25 DIAGNOSIS — S76312D Strain of muscle, fascia and tendon of the posterior muscle group at thigh level, left thigh, subsequent encounter: Secondary | ICD-10-CM | POA: Diagnosis not present

## 2017-08-27 DIAGNOSIS — S76312D Strain of muscle, fascia and tendon of the posterior muscle group at thigh level, left thigh, subsequent encounter: Secondary | ICD-10-CM | POA: Diagnosis not present

## 2017-08-27 DIAGNOSIS — M25551 Pain in right hip: Secondary | ICD-10-CM | POA: Diagnosis not present

## 2017-08-27 DIAGNOSIS — M7072 Other bursitis of hip, left hip: Secondary | ICD-10-CM | POA: Diagnosis not present

## 2017-08-27 DIAGNOSIS — M25552 Pain in left hip: Secondary | ICD-10-CM | POA: Diagnosis not present

## 2017-08-29 ENCOUNTER — Other Ambulatory Visit (INDEPENDENT_AMBULATORY_CARE_PROVIDER_SITE_OTHER): Payer: Self-pay | Admitting: Orthopaedic Surgery

## 2017-08-31 NOTE — Telephone Encounter (Signed)
Ok for refill? 

## 2017-09-01 NOTE — Telephone Encounter (Signed)
OK refill thanks

## 2017-09-02 DIAGNOSIS — J189 Pneumonia, unspecified organism: Secondary | ICD-10-CM | POA: Diagnosis not present

## 2017-09-02 DIAGNOSIS — I10 Essential (primary) hypertension: Secondary | ICD-10-CM | POA: Diagnosis not present

## 2017-09-02 DIAGNOSIS — Z6823 Body mass index (BMI) 23.0-23.9, adult: Secondary | ICD-10-CM | POA: Diagnosis not present

## 2017-09-02 DIAGNOSIS — R634 Abnormal weight loss: Secondary | ICD-10-CM | POA: Diagnosis not present

## 2017-09-08 ENCOUNTER — Ambulatory Visit
Admission: RE | Admit: 2017-09-08 | Discharge: 2017-09-08 | Disposition: A | Discharge status: Home / Self Care | Payer: PPO | Source: Ambulatory Visit | Attending: Physician Assistant | Admitting: Physician Assistant

## 2017-09-08 ENCOUNTER — Other Ambulatory Visit: Payer: Self-pay | Admitting: Physician Assistant

## 2017-09-08 DIAGNOSIS — M545 Low back pain: Secondary | ICD-10-CM | POA: Diagnosis not present

## 2017-09-08 DIAGNOSIS — M7072 Other bursitis of hip, left hip: Secondary | ICD-10-CM | POA: Diagnosis not present

## 2017-09-08 DIAGNOSIS — R05 Cough: Secondary | ICD-10-CM | POA: Diagnosis not present

## 2017-09-08 DIAGNOSIS — J189 Pneumonia, unspecified organism: Secondary | ICD-10-CM

## 2017-09-10 DIAGNOSIS — I10 Essential (primary) hypertension: Secondary | ICD-10-CM | POA: Diagnosis not present

## 2017-09-10 DIAGNOSIS — R634 Abnormal weight loss: Secondary | ICD-10-CM | POA: Diagnosis not present

## 2017-09-10 DIAGNOSIS — J189 Pneumonia, unspecified organism: Secondary | ICD-10-CM | POA: Diagnosis not present

## 2017-09-10 DIAGNOSIS — R05 Cough: Secondary | ICD-10-CM | POA: Diagnosis not present

## 2017-09-10 DIAGNOSIS — K869 Disease of pancreas, unspecified: Secondary | ICD-10-CM | POA: Diagnosis not present

## 2017-09-14 ENCOUNTER — Telehealth: Payer: Self-pay | Admitting: Gastroenterology

## 2017-09-14 NOTE — Telephone Encounter (Signed)
Patient notified that we are not trying to reach her.  Patient states that her primary care Dr. Tobie Lords wanted her to come in and see Dr. Fuller Plan for weight loss.  She will come in tomorrow at 10:00

## 2017-09-14 NOTE — Telephone Encounter (Signed)
Patient states she is returning a call from Friday 2.15.19 from our office. Did not see note in system.

## 2017-09-15 ENCOUNTER — Ambulatory Visit: Payer: PPO | Admitting: Gastroenterology

## 2017-09-15 ENCOUNTER — Encounter: Payer: Self-pay | Admitting: Gastroenterology

## 2017-09-15 VITALS — BP 118/70 | HR 78 | Ht 61.0 in | Wt 122.0 lb

## 2017-09-15 DIAGNOSIS — K5130 Ulcerative (chronic) rectosigmoiditis without complications: Secondary | ICD-10-CM

## 2017-09-15 DIAGNOSIS — K862 Cyst of pancreas: Secondary | ICD-10-CM

## 2017-09-15 DIAGNOSIS — R634 Abnormal weight loss: Secondary | ICD-10-CM

## 2017-09-15 NOTE — Patient Instructions (Signed)
You have been scheduled for an MRI/MRCP at [facility] on 09/23/17. Your appointment time is 9:00am. Please arrive 15 minutes prior to your appointment time for registration purposes. Please make certain not to have anything to eat or drink 4 hours prior to your test. In addition, if you have any metal in your body, have a pacemaker or defibrillator, please be sure to let your ordering physician know. This test typically takes 45 minutes to 1 hour to complete. Should you need to reschedule, please call [phone] to do so.  Please drink Ensure or Boost in between meals to help with weight gain. If you continue to lose weight then contact our office for follow up visit.   Thank you for choosing me and [facility].  Pricilla Riffle. Dagoberto Ligas., MD., Marval Regal

## 2017-09-15 NOTE — Progress Notes (Signed)
    History of Present Illness: This is an 84 year old female with ulcerative proctosigmoiditis, pancreatic cystic lesion and weight loss.  She is accompanied by a friend.  She relates being diagnosed with influenza and then pneumonia during December and January.  She states her appetite was very poor during this time and she feels this is why she lost weight.  Other than her loss of appetite she has had no other gastrointestinal complaints. He appetite returned to normal about 2 weeks ago. Weight was 142 in June and is 122 today.  She is due for follow-up of her pancreatic cystic lesion.  Current Medications, Allergies, Past Medical History, Past Surgical History, Family History and Social History were reviewed in [employer] record.  Physical Exam: General: Well developed, well nourished, no acute distress Head: Normocephalic and atraumatic Eyes:  sclerae anicteric, EOMI Ears: Normal auditory acuity Mouth: No deformity or lesions Lungs: Clear throughout to auscultation Heart: Regular rate and rhythm; no murmurs, rubs or bruits Abdomen: Soft, non tender and non distended. No masses, hepatosplenomegaly or hernias noted. Normal Bowel sounds Rectal: not done Musculoskeletal: Symmetrical with no gross deformities  Pulses:  Normal pulses noted Extremities: No clubbing, cyanosis, edema or deformities noted Neurological: Alert oriented x 4, grossly nonfocal Psychological:  Alert and cooperative. Normal mood and affect  Assessment and Recommendations:  1. Weight loss of 20 pounds.  Loss of appetite, resolved. Weight loss is likely due anorexia related to to influenza followed by pneumonia.  She is advised to take Ensure or Boost in between meals for the next few weeks and to eat a balanced/healthy diet. REV in 6 weeks.   2. Pancreatic cystic lesion. Schedule MRI/MRCP with and without contrast to further evaluate for interval change. REV in 6 weeks.   3.  Ulcerative  proctosigmoiditis, stable. Continue balsalazideble 2.25 g po 3 times daily.  4.  GERD.  Continue omeprazole 40 mg daily and standard antireflux measures.

## 2017-09-18 DIAGNOSIS — M7072 Other bursitis of hip, left hip: Secondary | ICD-10-CM | POA: Diagnosis not present

## 2017-09-19 ENCOUNTER — Other Ambulatory Visit: Payer: Self-pay | Admitting: Gastroenterology

## 2017-09-19 ENCOUNTER — Other Ambulatory Visit: Payer: Self-pay | Admitting: Cardiovascular Disease

## 2017-09-19 DIAGNOSIS — K51819 Other ulcerative colitis with unspecified complications: Secondary | ICD-10-CM

## 2017-09-19 DIAGNOSIS — R002 Palpitations: Secondary | ICD-10-CM

## 2017-09-23 ENCOUNTER — Other Ambulatory Visit: Payer: Self-pay | Admitting: Gastroenterology

## 2017-09-23 ENCOUNTER — Ambulatory Visit (HOSPITAL_COMMUNITY)
Admission: RE | Admit: 2017-09-23 | Discharge: 2017-09-23 | Disposition: A | Discharge status: Home / Self Care | Payer: PPO | Source: Ambulatory Visit | Attending: Gastroenterology | Admitting: Gastroenterology

## 2017-09-23 DIAGNOSIS — K862 Cyst of pancreas: Secondary | ICD-10-CM

## 2017-09-23 DIAGNOSIS — R932 Abnormal findings on diagnostic imaging of liver and biliary tract: Secondary | ICD-10-CM | POA: Diagnosis not present

## 2017-09-23 LAB — POCT I-STAT CREATININE: Creatinine, Ser: 0.7 mg/dL (ref 0.44–1.00)

## 2017-09-23 MED ORDER — GADOBENATE DIMEGLUMINE 529 MG/ML IV SOLN
10.0000 mL | Freq: Once | INTRAVENOUS | Status: AC | PRN
  Administered 2017-09-23: 9 mL via INTRAVENOUS

## 2017-11-06 DIAGNOSIS — R634 Abnormal weight loss: Secondary | ICD-10-CM | POA: Diagnosis not present

## 2017-11-06 DIAGNOSIS — Z6821 Body mass index (BMI) 21.0-21.9, adult: Secondary | ICD-10-CM | POA: Diagnosis not present

## 2017-11-06 DIAGNOSIS — K869 Disease of pancreas, unspecified: Secondary | ICD-10-CM | POA: Diagnosis not present

## 2017-11-06 DIAGNOSIS — R05 Cough: Secondary | ICD-10-CM | POA: Diagnosis not present

## 2017-11-06 DIAGNOSIS — I10 Essential (primary) hypertension: Secondary | ICD-10-CM | POA: Diagnosis not present

## 2017-12-28 DIAGNOSIS — S51801A Unspecified open wound of right forearm, initial encounter: Secondary | ICD-10-CM | POA: Diagnosis not present

## 2017-12-28 DIAGNOSIS — R51 Headache: Secondary | ICD-10-CM | POA: Diagnosis not present

## 2018-01-05 ENCOUNTER — Telehealth: Payer: Self-pay | Admitting: Gastroenterology

## 2018-01-05 NOTE — Telephone Encounter (Signed)
Patient has been having HA and wants a referral to [facility].  She will check with her primary care and if they are not able to help her she will call back

## 2018-01-05 NOTE — Telephone Encounter (Signed)
Patient wanting to know if Dr.Stark has a certain doctor at [facility] neurology that he would recommend and if so if he could refer her.

## 2018-01-07 DIAGNOSIS — G44209 Tension-type headache, unspecified, not intractable: Secondary | ICD-10-CM | POA: Diagnosis not present

## 2018-01-07 DIAGNOSIS — M542 Cervicalgia: Secondary | ICD-10-CM | POA: Diagnosis not present

## 2018-04-05 DIAGNOSIS — N3001 Acute cystitis with hematuria: Secondary | ICD-10-CM | POA: Diagnosis not present

## 2018-04-16 ENCOUNTER — Other Ambulatory Visit: Payer: Self-pay | Admitting: Neurology

## 2018-04-22 DIAGNOSIS — Z6822 Body mass index (BMI) 22.0-22.9, adult: Secondary | ICD-10-CM | POA: Diagnosis not present

## 2018-04-22 DIAGNOSIS — R5383 Other fatigue: Secondary | ICD-10-CM | POA: Diagnosis not present

## 2018-04-22 DIAGNOSIS — Z7689 Persons encountering health services in other specified circumstances: Secondary | ICD-10-CM | POA: Diagnosis not present

## 2018-04-22 DIAGNOSIS — R634 Abnormal weight loss: Secondary | ICD-10-CM | POA: Diagnosis not present

## 2018-05-03 ENCOUNTER — Other Ambulatory Visit: Payer: Self-pay | Admitting: Gastroenterology

## 2018-05-03 DIAGNOSIS — K51819 Other ulcerative colitis with unspecified complications: Secondary | ICD-10-CM

## 2018-05-11 DIAGNOSIS — M545 Low back pain: Secondary | ICD-10-CM | POA: Diagnosis not present

## 2018-05-11 DIAGNOSIS — I10 Essential (primary) hypertension: Secondary | ICD-10-CM | POA: Diagnosis not present

## 2018-05-11 DIAGNOSIS — M159 Polyosteoarthritis, unspecified: Secondary | ICD-10-CM | POA: Diagnosis not present

## 2018-05-11 DIAGNOSIS — Z23 Encounter for immunization: Secondary | ICD-10-CM | POA: Diagnosis not present

## 2018-05-11 DIAGNOSIS — Z1331 Encounter for screening for depression: Secondary | ICD-10-CM | POA: Diagnosis not present

## 2018-05-11 DIAGNOSIS — Z79899 Other long term (current) drug therapy: Secondary | ICD-10-CM | POA: Diagnosis not present

## 2018-05-11 DIAGNOSIS — Z9181 History of falling: Secondary | ICD-10-CM | POA: Diagnosis not present

## 2018-05-11 DIAGNOSIS — Z1339 Encounter for screening examination for other mental health and behavioral disorders: Secondary | ICD-10-CM | POA: Diagnosis not present

## 2018-05-11 DIAGNOSIS — R634 Abnormal weight loss: Secondary | ICD-10-CM | POA: Diagnosis not present

## 2018-05-11 DIAGNOSIS — Z139 Encounter for screening, unspecified: Secondary | ICD-10-CM | POA: Diagnosis not present

## 2018-05-11 DIAGNOSIS — Z6823 Body mass index (BMI) 23.0-23.9, adult: Secondary | ICD-10-CM | POA: Diagnosis not present

## 2018-05-11 DIAGNOSIS — K5190 Ulcerative colitis, unspecified, without complications: Secondary | ICD-10-CM | POA: Diagnosis not present

## 2018-06-05 ENCOUNTER — Other Ambulatory Visit: Payer: Self-pay | Admitting: Gastroenterology

## 2018-06-05 DIAGNOSIS — K51819 Other ulcerative colitis with unspecified complications: Secondary | ICD-10-CM

## 2018-06-08 IMAGING — MR MR 3D RECON AT [facility]
19 of 23 series · 19 of 23 positions shown · IV contrast (multihance)
Comparison: 09/17/2015.

CLINICAL DATA: Weight loss with known pancreatic cystic lesion.

EXAM:
MRI ABDOMEN WITHOUT AND WITH CONTRAST (INCLUDING MRCP)
TECHNIQUE: Multiplanar multisequence MR imaging of the abdomen was performed
both before and after the administration of intravenous contrast.
Heavily T2-weighted images of the biliary and pancreatic ducts were
obtained, and three-dimensional MRCP images were rendered by post
processing.
CONTRAST:  9mL MULTIHANCE GADOBENATE DIMEGLUMINE 529 MG/ML IV SOLN

[Series 3: T2 fat-sat · axial · 5.0mm · 0.78mm/px · 1 of 42 slices shown]
[im 1/42]
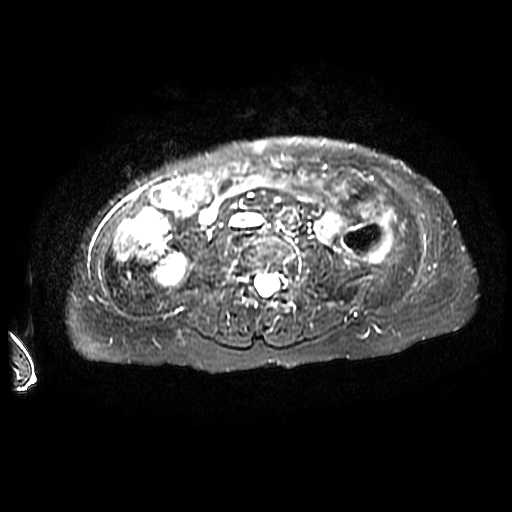

[Series 4: MRCP · coronal · 1.6mm · 0.62mm/px · 1 of 125 slices shown (1 of 2)]
[im 1/125]
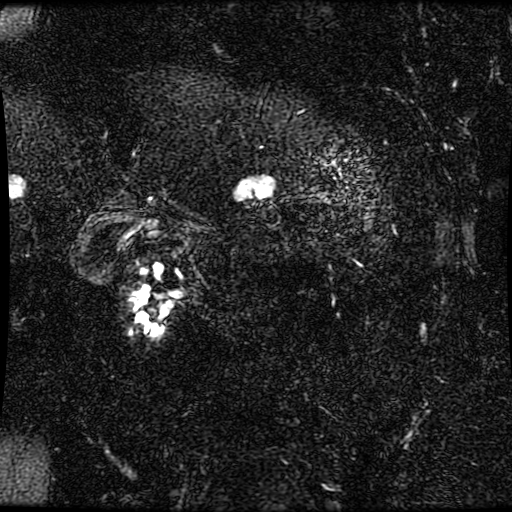

[Series 6: DWI b500 · axial · 6.0mm · 1.48mm/px · 1 of 56 slices shown]
[im 1/56]
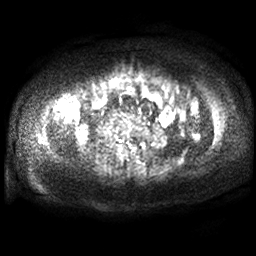

[Series 7: ax dualecho · axial · 5.0mm · 0.78mm/px · 1 of 88 slices shown]
[im 1/88]
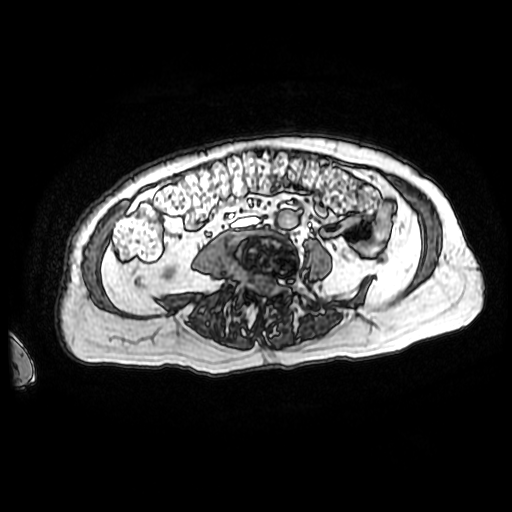

[Series 8: MRCP · coronal · 40.0mm · 0.70mm/px · 1 of 6 slices shown (2 of 2)]
[im 1/6]
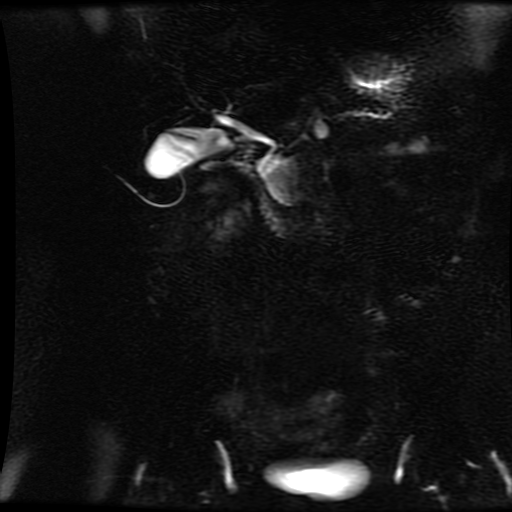

[Series 9: bSSFP fat-sat · coronal · 5.0mm · 0.70mm/px · 1 of 36 slices shown]
[im 1/36]
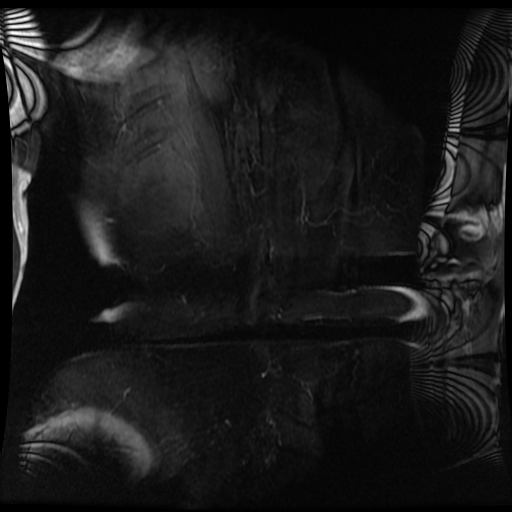

[Series 10: T2 · axial · 5.0mm · 0.78mm/px · 1 of 42 slices shown]
[im 1/42]
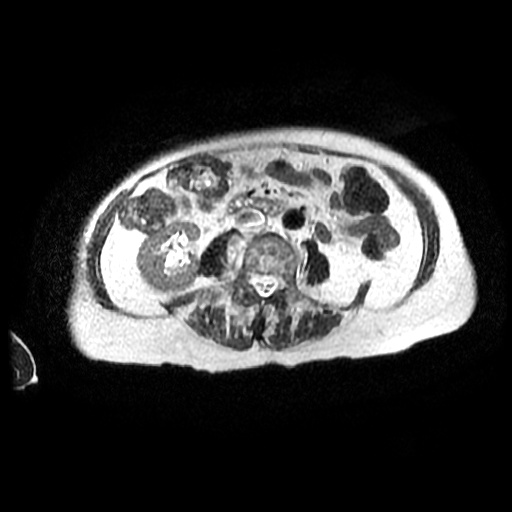

[Series 400: reformatted · axial · 1.6mm · 0.62mm/px · 1 of 49 slices shown (1 of 2)]
[im 1/49]
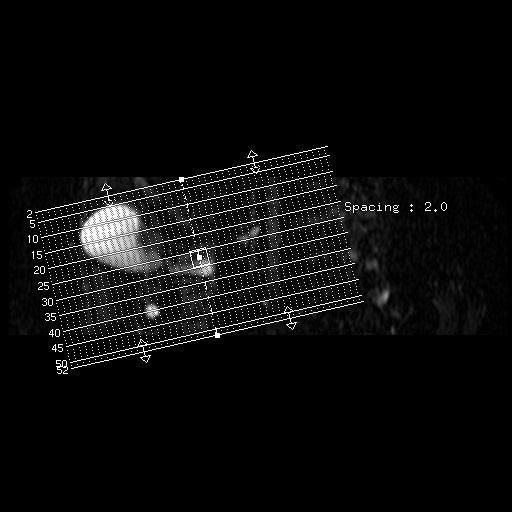

[Series 401: reformatted · sagittal · 100.0mm · 0.51mm/px · 1 of 178 slices shown (2 of 2)]
[im 1/178]
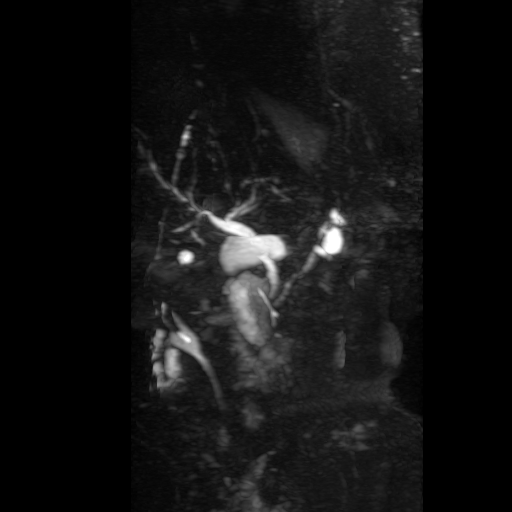

[Series 600: DWI · axial · 6.0mm · 1.48mm/px · 1 of 28 slices shown]
[im 1/28]
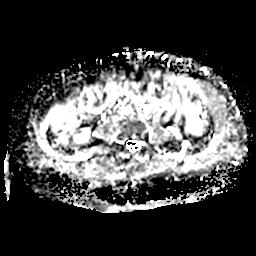

[Series 1100: T1 dynamic · axial · 7.0mm · 0.78mm/px · 1 of 68 slices shown (1 of 5)]
[im 1/68]
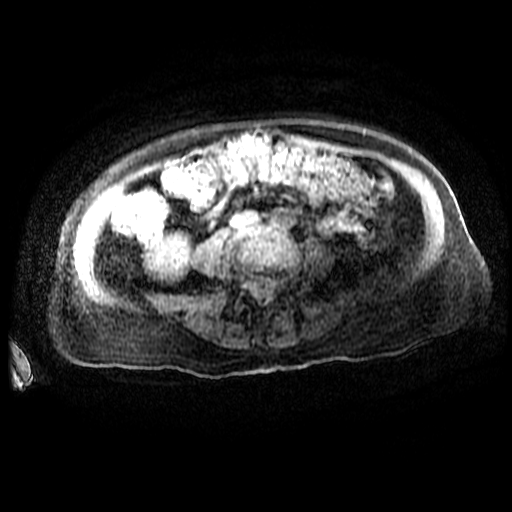

[Series 1101: T1 dynamic · axial · 7.0mm · 0.78mm/px · 1 of 68 slices shown (2 of 5)]
[im 1/68]
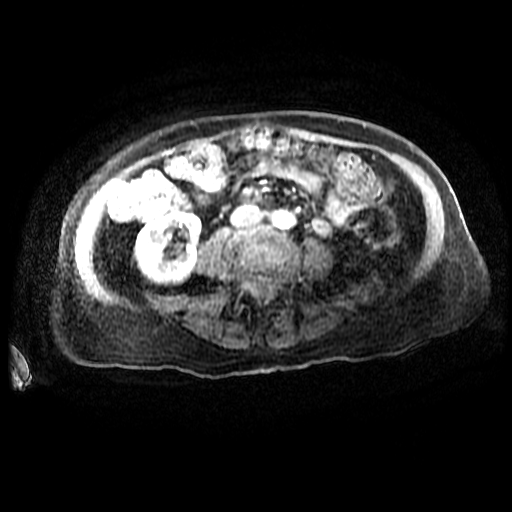

[Series 1103: T1 dynamic · axial · 7.0mm · 0.78mm/px · 1 of 68 slices shown (3 of 5)]
[im 1/68]
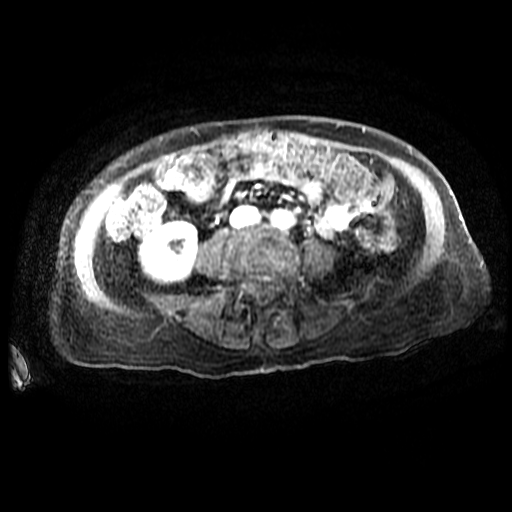

[Series 1104: T1 dynamic · axial · 7.0mm · 0.78mm/px · 1 of 68 slices shown (4 of 5)]
[im 1/68]
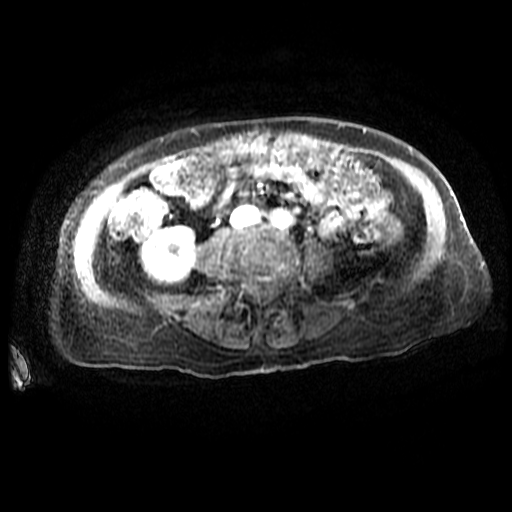

[Series 1105: T1 dynamic · axial · 7.0mm · 0.78mm/px · 1 of 68 slices shown (5 of 5)]
[im 1/68]
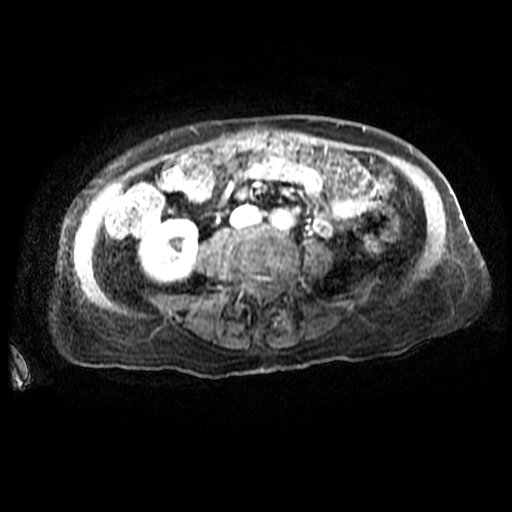

[((id)/(id)/1)-((id)/(id)/1) · axial · 7.0mm · 0.78mm/px · 1 of 68 slices shown (1 of 4)]
[im 1/68]
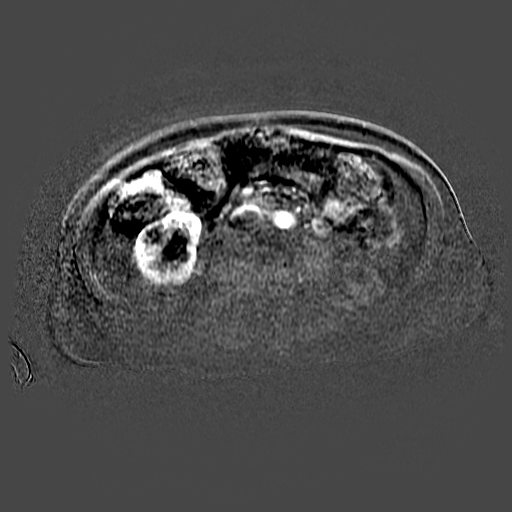

[((id)/(id)/1)-((id)/(id)/1) · axial · 7.0mm · 0.78mm/px · 1 of 68 slices shown (2 of 4)]
[im 1/68]
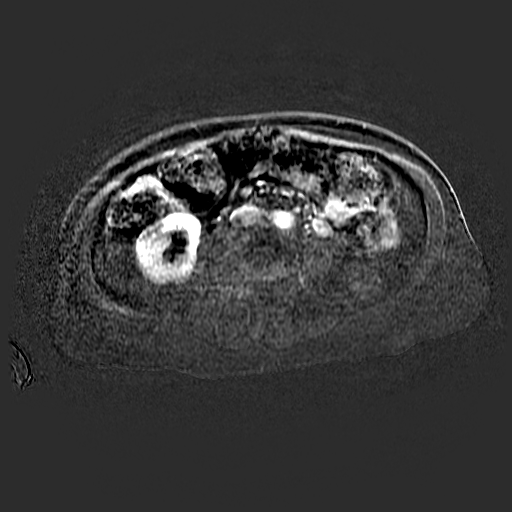

[((id)/(id)/1)-((id)/(id)/1) · axial · 7.0mm · 0.78mm/px · 1 of 68 slices shown (3 of 4)]
[im 1/68]
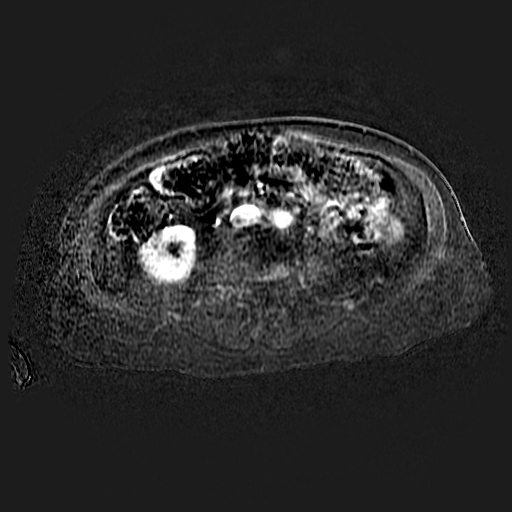

[((id)/(id)/1)-((id)/(id)/1) · axial · 7.0mm · 0.78mm/px · 1 of 68 slices shown (4 of 4)]
[im 1/68]
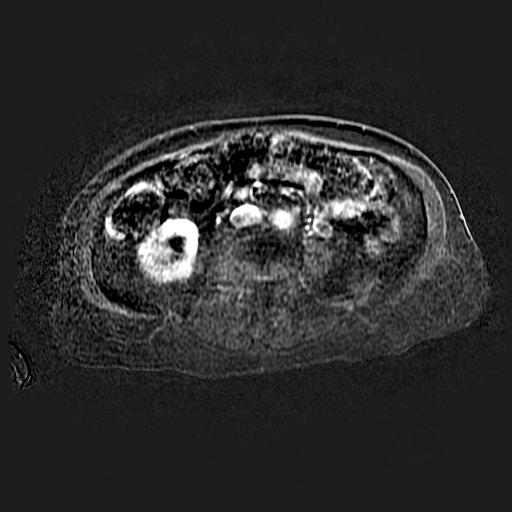

[19 of 23 positions shown; findings below may reference images not displayed]

FINDINGS: Lower chest: Unremarkable.

Hepatobiliary: Stable appearance 6 mm simple cyst medial right
liver. Gallbladder unremarkable. No substantial intra or
extrahepatic biliary duct dilatation.

Pancreas: 7 mm simple appearing cyst identified in the body of
pancreas on prior study has increased slightly in size in the
interval, measuring 13 mm today. The lesion was 8 mm in craniocaudal
length previously which compares to 12 mm today. This lesion
probably communicates with the main duct. No evidence for internal
architecture or enhancing components on postcontrast
imaging.Adjacent smaller cystic foci are similar. Mild diffuse
distention of the main pancreatic duct is unchanged.

Spleen:  No splenomegaly. No focal mass lesion.

Adrenals/Urinary Tract: No adrenal nodule or mass. Kidneys
unremarkable

Stomach/Bowel: Stomach is nondistended. No gastric wall thickening.
No evidence of outlet obstruction. Duodenum is normally positioned
as is the ligament of Treitz. No small bowel or colonic dilatation.

Vascular/Lymphatic: No abdominal aortic aneurysm There is no
gastrohepatic or hepatoduodenal ligament lymphadenopathy. No
intraperitoneal or retroperitoneal lymphadenopathy.

Other:  No intraperitoneal free fluid.

Musculoskeletal: No abnormal marrow enhancement within the
visualized bony anatomy. Similar appearance of a 2.8 x 2.0 cm cystic
lesion in the subcutaneous fat of the anterior epigastric abdominal
wall.
IMPRESSION: 1. Similar size to slight interval progression of the homogeneous
well-defined cyst in the pancreatic body when compared to the most
recent comparison study with less obvious increase when comparing
back to the older study from 03/20/2014. This appears to communicate
with the main pancreatic duct. Imaging features remain compatible
with a side-branch IPMN. Given apparent slight progression, repeat
MRI in 12 months could be performed. This recommendation follows ACR
consensus guidelines: Management of Incidental Pancreatic Cysts: A
White Paper of the ACR Incidental Findings Committee. [HOSPITAL] 0663;[DATE].

## 2018-07-03 ENCOUNTER — Other Ambulatory Visit: Payer: Self-pay | Admitting: Gastroenterology

## 2018-07-03 DIAGNOSIS — K51819 Other ulcerative colitis with unspecified complications: Secondary | ICD-10-CM

## 2018-08-14 ENCOUNTER — Other Ambulatory Visit: Payer: Self-pay | Admitting: Gastroenterology

## 2018-08-14 DIAGNOSIS — K51819 Other ulcerative colitis with unspecified complications: Secondary | ICD-10-CM

## 2018-08-24 DIAGNOSIS — Z23 Encounter for immunization: Secondary | ICD-10-CM | POA: Diagnosis not present

## 2018-08-24 DIAGNOSIS — R05 Cough: Secondary | ICD-10-CM | POA: Diagnosis not present

## 2018-08-24 DIAGNOSIS — M159 Polyosteoarthritis, unspecified: Secondary | ICD-10-CM | POA: Diagnosis not present

## 2018-08-24 DIAGNOSIS — Z6822 Body mass index (BMI) 22.0-22.9, adult: Secondary | ICD-10-CM | POA: Diagnosis not present

## 2018-08-24 DIAGNOSIS — I10 Essential (primary) hypertension: Secondary | ICD-10-CM | POA: Diagnosis not present

## 2018-09-25 ENCOUNTER — Other Ambulatory Visit: Payer: Self-pay | Admitting: Gastroenterology

## 2018-09-25 DIAGNOSIS — K51819 Other ulcerative colitis with unspecified complications: Secondary | ICD-10-CM

## 2018-11-01 ENCOUNTER — Telehealth: Payer: Self-pay | Admitting: Gastroenterology

## 2018-11-01 DIAGNOSIS — K51819 Other ulcerative colitis with unspecified complications: Secondary | ICD-10-CM

## 2018-11-01 MED ORDER — BALSALAZIDE DISODIUM 750 MG PO CAPS: ORAL_CAPSULE | ORAL | 1 refills | Status: DC

## 2018-11-01 NOTE — Telephone Encounter (Signed)
Informed patient that I will send a prescription to her pharmacy but she does need to at least do a telephone visit soon with Dr. Fuller Plan for her yearly follow-up. Patient agreed and will call back in a month to schedule her telephone visit.

## 2018-12-16 DIAGNOSIS — Z6822 Body mass index (BMI) 22.0-22.9, adult: Secondary | ICD-10-CM | POA: Diagnosis not present

## 2018-12-16 DIAGNOSIS — M545 Low back pain: Secondary | ICD-10-CM | POA: Diagnosis not present

## 2018-12-16 DIAGNOSIS — I10 Essential (primary) hypertension: Secondary | ICD-10-CM | POA: Diagnosis not present

## 2018-12-16 DIAGNOSIS — K5190 Ulcerative colitis, unspecified, without complications: Secondary | ICD-10-CM | POA: Diagnosis not present

## 2018-12-16 DIAGNOSIS — R55 Syncope and collapse: Secondary | ICD-10-CM | POA: Diagnosis not present

## 2018-12-16 DIAGNOSIS — Z79899 Other long term (current) drug therapy: Secondary | ICD-10-CM | POA: Diagnosis not present

## 2018-12-16 DIAGNOSIS — M159 Polyosteoarthritis, unspecified: Secondary | ICD-10-CM | POA: Diagnosis not present

## 2019-01-01 ENCOUNTER — Other Ambulatory Visit: Payer: Self-pay | Admitting: Gastroenterology

## 2019-01-01 DIAGNOSIS — K51819 Other ulcerative colitis with unspecified complications: Secondary | ICD-10-CM

## 2019-01-31 DIAGNOSIS — M19071 Primary osteoarthritis, right ankle and foot: Secondary | ICD-10-CM | POA: Diagnosis not present

## 2019-01-31 DIAGNOSIS — M2031 Hallux varus (acquired), right foot: Secondary | ICD-10-CM | POA: Diagnosis not present

## 2019-01-31 DIAGNOSIS — S96911A Strain of unspecified muscle and tendon at ankle and foot level, right foot, initial encounter: Secondary | ICD-10-CM | POA: Diagnosis not present

## 2019-02-05 ENCOUNTER — Other Ambulatory Visit: Payer: Self-pay | Admitting: Gastroenterology

## 2019-02-05 DIAGNOSIS — K51819 Other ulcerative colitis with unspecified complications: Secondary | ICD-10-CM

## 2019-02-11 ENCOUNTER — Other Ambulatory Visit: Payer: Self-pay | Admitting: Gastroenterology

## 2019-02-11 DIAGNOSIS — K51819 Other ulcerative colitis with unspecified complications: Secondary | ICD-10-CM

## 2019-02-28 ENCOUNTER — Telehealth: Payer: Self-pay | Admitting: Gastroenterology

## 2019-02-28 DIAGNOSIS — K51819 Other ulcerative colitis with unspecified complications: Secondary | ICD-10-CM

## 2019-02-28 MED ORDER — BALSALAZIDE DISODIUM 750 MG PO CAPS: ORAL_CAPSULE | ORAL | 0 refills | Status: DC

## 2019-02-28 NOTE — Telephone Encounter (Signed)
Patient called said that the medication balsalazide (COLAZAL) 750 MG has been discontinued. She would like to also schedule an appt for blood in stool. I have offered the patient Dr. Fuller Plan next available opening (aug 1) and she did not want to take it.

## 2019-02-28 NOTE — Telephone Encounter (Signed)
Correction I offered her September 1.

## 2019-02-28 NOTE — Telephone Encounter (Signed)
Take Colazal as prescribed Hydrocortisone enemas pr qd, #14, 1 refill Keep GI office appt even if symptoms improve

## 2019-02-28 NOTE — Telephone Encounter (Signed)
Patient states the pharmacy told her she cannot have any more refills. Informed patient I will send a refill to her pharmacy for Colazal. Also patient states she has been having BRB and mucus in her stool one episode 2 weeks ago a large amount and one episode last week. Denies any other symptoms.  Made appt with Tye Savoy, NP for 03/22/19 at 2:00pm. Patient was put on a waiting list with a APP. Dr. Lynne Leader first available is on 03/29/19. Please advise Dr. Fuller Plan if you recommend anything else until appt.

## 2019-02-28 NOTE — Telephone Encounter (Signed)
Called patient with no answer and no voicemail to leave a message.

## 2019-02-28 NOTE — Telephone Encounter (Signed)
Patient informed to pick up hydrocortisone enemas from pharmacy daily x 14 days and can be used x 1 month if needed. Also take continue Colazal as prescribed. Also to keep GI appt even if symptoms improve and to contact our office if symptoms get worse. Patient verbalized understanding.

## 2019-03-21 ENCOUNTER — Telehealth: Payer: Self-pay | Admitting: Gastroenterology

## 2019-03-21 NOTE — Telephone Encounter (Signed)
Patient states she could not find the enemas at the pharmacy but is actually feeling better and the bleeding has stopped since starting back on Colazal every day. Patient states she would like to see Dr. Fuller Plan so she will cancel the appt with Nevin Bloodgood at this time and rescheduled to see Dr. Fuller Plan in October.

## 2019-03-22 ENCOUNTER — Encounter

## 2019-03-22 ENCOUNTER — Ambulatory Visit: Payer: PPO | Admitting: Nurse Practitioner

## 2019-04-11 ENCOUNTER — Telehealth: Payer: Self-pay | Admitting: Gastroenterology

## 2019-04-11 DIAGNOSIS — K51819 Other ulcerative colitis with unspecified complications: Secondary | ICD-10-CM

## 2019-04-11 MED ORDER — BALSALAZIDE DISODIUM 750 MG PO CAPS: ORAL_CAPSULE | ORAL | 0 refills | Status: DC

## 2019-04-11 NOTE — Telephone Encounter (Signed)
Patient reports that she fell last week and has had trouble with constipation since last Thursday.  She has tried stool softeners and took 3 doses of Miralax.  She will continue Miralax 1-3 times a day.  She will call back if she continues to have constipation prior to her OV on 04/29/19

## 2019-04-11 NOTE — Telephone Encounter (Signed)
Pt reported that she has not had a BM since Thursday.  Please advise.

## 2019-04-22 DIAGNOSIS — S3210XA Unspecified fracture of sacrum, initial encounter for closed fracture: Secondary | ICD-10-CM | POA: Diagnosis not present

## 2019-04-26 DIAGNOSIS — M545 Low back pain: Secondary | ICD-10-CM | POA: Diagnosis not present

## 2019-04-26 DIAGNOSIS — G5603 Carpal tunnel syndrome, bilateral upper limbs: Secondary | ICD-10-CM | POA: Diagnosis not present

## 2019-04-26 DIAGNOSIS — S3210XD Unspecified fracture of sacrum, subsequent encounter for fracture with routine healing: Secondary | ICD-10-CM | POA: Diagnosis not present

## 2019-04-28 ENCOUNTER — Telehealth: Payer: Self-pay | Admitting: Gastroenterology

## 2019-04-28 DIAGNOSIS — K51819 Other ulcerative colitis with unspecified complications: Secondary | ICD-10-CM

## 2019-04-28 NOTE — Telephone Encounter (Signed)
Pt just cancelled her appt with Dr. Fuller Plan for tomorrow because she fractured her back. She states that she needs a f/u to be able to rf her medication that will run out in two weeks. She is asking if she could do a telephone call or she has to come to [place] for imaging on Monday and on 10/12 so she is wondering if we can squeeze her in, she will try to make it. Pls call her.

## 2019-04-29 ENCOUNTER — Ambulatory Visit: Payer: PPO | Admitting: Gastroenterology

## 2019-04-29 ENCOUNTER — Telehealth: Payer: Self-pay | Admitting: Gastroenterology

## 2019-04-29 MED ORDER — BALSALAZIDE DISODIUM 750 MG PO CAPS: ORAL_CAPSULE | ORAL | 0 refills | Status: DC

## 2019-04-29 NOTE — Telephone Encounter (Signed)
Pt had to reschedule her appt due to pelvic fracture.  She requested to refill balsalazide until rescheduled appt in Nov.

## 2019-04-29 NOTE — Telephone Encounter (Signed)
Informed patient she can be a virtual visit on 06/07/19. Patient does not have a smart phone. Informed patient Dr. Fuller Plan can call her for a telephone visit at 8:30am on 06/07/19. Informed patient I will send her refill of basalazide to her pharmacy. Patient verbalized understanding.

## 2019-04-29 NOTE — Telephone Encounter (Signed)
Please advise Dr. Fuller Plan if you want patient to have virtual visit for medication refill and can give her refills on her omeprazole.

## 2019-04-29 NOTE — Telephone Encounter (Signed)
OK for virtual visit this time

## 2019-04-29 NOTE — Telephone Encounter (Signed)
See previous phone message. 

## 2019-05-02 DIAGNOSIS — R102 Pelvic and perineal pain: Secondary | ICD-10-CM | POA: Diagnosis not present

## 2019-05-09 DIAGNOSIS — M545 Low back pain: Secondary | ICD-10-CM | POA: Diagnosis not present

## 2019-05-09 DIAGNOSIS — R102 Pelvic and perineal pain: Secondary | ICD-10-CM | POA: Diagnosis not present

## 2019-06-07 ENCOUNTER — Encounter: Payer: Self-pay | Admitting: Gastroenterology

## 2019-06-07 ENCOUNTER — Other Ambulatory Visit: Payer: Self-pay

## 2019-06-07 ENCOUNTER — Ambulatory Visit (INDEPENDENT_AMBULATORY_CARE_PROVIDER_SITE_OTHER): Payer: PPO | Admitting: Gastroenterology

## 2019-06-07 VITALS — Ht 61.0 in | Wt 115.0 lb

## 2019-06-07 DIAGNOSIS — K219 Gastro-esophageal reflux disease without esophagitis: Secondary | ICD-10-CM | POA: Diagnosis not present

## 2019-06-07 DIAGNOSIS — K51819 Other ulcerative colitis with unspecified complications: Secondary | ICD-10-CM | POA: Diagnosis not present

## 2019-06-07 DIAGNOSIS — K862 Cyst of pancreas: Secondary | ICD-10-CM | POA: Diagnosis not present

## 2019-06-07 DIAGNOSIS — K5130 Ulcerative (chronic) rectosigmoiditis without complications: Secondary | ICD-10-CM | POA: Diagnosis not present

## 2019-06-07 MED ORDER — OMEPRAZOLE 20 MG PO CPDR: 20.0000 mg | DELAYED_RELEASE_CAPSULE | Freq: Every day | ORAL | 11 refills | Status: DC

## 2019-06-07 MED ORDER — BALSALAZIDE DISODIUM 750 MG PO CAPS: ORAL_CAPSULE | ORAL | 11 refills | Status: DC

## 2019-06-07 NOTE — Progress Notes (Signed)
    History of Present Illness: This is an 85 year old female with left sided ulcerative colitis, GERD, esophageal stricture, pancreatic cystic lesion who requested a telephone visit instead of in person. She apparently does not have a smart phone or computer for video. She is recovering at from pelvic fracture.  Her mobility has increased substantially.  She has a follow-up appointment with [facility] in early December.  She is no longer taking acid reflux medications.  She has no gastrointestinal complaints.  She had several questions about the necessity of following the pancreatic lesion.  She has no current gastrointestinal complaints. Denies weight loss, abdominal pain, constipation, diarrhea, change in stool caliber, melena, hematochezia, nausea, vomiting, dysphagia, reflux symptoms, chest pain.   Current Medications, Allergies, Past Medical History, Past Surgical History, Family History and Social History were reviewed in [employer] record.  Physical Exam: Not done - telemedicine visit   Assessment and Recommendations:  1. Left sided UC.  Symptomatically under control.  Continue balsalazide 2.25 g 3 times daily.  CBC, CMP, TSH, ESR, CRP within the next few weeks and prior to MR.  2. GERD with esophageal stricture.  Esophageal dilation last performed in January 2018.  Resume omeprazole at 20 mg po daily long-term.  Follow antireflux measures.  3. Pancreatic cystic lesion suspicious for sidebranch intraductal papillary mucinous neoplasm.  Slightly larger on February 2019 MR. Discussed importance of follow-up given the slight increase in size and she is agreeable to proceed.  Addressed her questions to her satisfaction.  Schedule follow up pancreatic protocol MR within the next few weeks.   4. History of H pylori gastritis treated in 08/2016.  REV in 1 year and as needed.    These services were provided via telemedicine, audio only by patient request.   The patient was at home and the provider was in the office, alone.  We discussed the limitations of evaluation and management by telemedicine and the availability of in person appointments.  Patient consented for this telemedicine visit and is aware of possible charges for this service.  Office CMA or LPN participated in this telemedicine service.  Time spent on call: 12 minutes

## 2019-06-07 NOTE — Patient Instructions (Signed)
Your provider has requested that you go to the basement level for lab work in the next couple of weeks. Press "B" on the elevator. The lab is located at the first door on the left as you exit the elevator.  We have sent the following medications to your pharmacy for you to pick up at your convenience: balsalazide and omeprazole.   You have been scheduled for an MRI at [facility] on 07/07/19. Your appointment time is 10:00am. Please arrive 30 minutes prior to your appointment time for registration purposes. Please make certain not to have anything to eat or drink 4 hours prior to your test. In addition, if you have any metal in your body, have a pacemaker or defibrillator, please be sure to let your ordering physician know. This test typically takes 45 minutes to 1 hour to complete. Should you need to reschedule, please call [phone] to do so.  Thank you for choosing me and [facility].  Pricilla Riffle. Dagoberto Ligas., MD., [facility] Regal

## 2019-06-30 ENCOUNTER — Other Ambulatory Visit (INDEPENDENT_AMBULATORY_CARE_PROVIDER_SITE_OTHER): Payer: PPO

## 2019-06-30 ENCOUNTER — Other Ambulatory Visit: Payer: Self-pay

## 2019-06-30 DIAGNOSIS — K51819 Other ulcerative colitis with unspecified complications: Secondary | ICD-10-CM

## 2019-06-30 DIAGNOSIS — K862 Cyst of pancreas: Secondary | ICD-10-CM

## 2019-06-30 DIAGNOSIS — M545 Low back pain: Secondary | ICD-10-CM | POA: Diagnosis not present

## 2019-06-30 DIAGNOSIS — K5130 Ulcerative (chronic) rectosigmoiditis without complications: Secondary | ICD-10-CM

## 2019-06-30 DIAGNOSIS — M25551 Pain in right hip: Secondary | ICD-10-CM | POA: Diagnosis not present

## 2019-06-30 DIAGNOSIS — K219 Gastro-esophageal reflux disease without esophagitis: Secondary | ICD-10-CM | POA: Diagnosis not present

## 2019-06-30 LAB — CBC WITH DIFFERENTIAL/PLATELET
Basophils Absolute: 0.1 10*3/uL (ref 0.0–0.1)
Basophils Relative: 0.9 % (ref 0.0–3.0)
Eosinophils Absolute: 0.4 10*3/uL (ref 0.0–0.7)
Eosinophils Relative: 5.4 % — ABNORMAL HIGH (ref 0.0–5.0)
HCT: 40.3 % (ref 36.0–46.0)
Hemoglobin: 13.3 g/dL (ref 12.0–15.0)
Lymphocytes Relative: 37.6 % (ref 12.0–46.0)
Lymphs Abs: 2.6 10*3/uL (ref 0.7–4.0)
MCHC: 33.1 g/dL (ref 30.0–36.0)
MCV: 99.7 fl (ref 78.0–100.0)
Monocytes Absolute: 0.7 10*3/uL (ref 0.1–1.0)
Monocytes Relative: 10.5 % (ref 3.0–12.0)
Neutro Abs: 3.1 10*3/uL (ref 1.4–7.7)
Neutrophils Relative %: 45.6 % (ref 43.0–77.0)
Platelets: 332 10*3/uL (ref 150.0–400.0)
RBC: 4.04 Mil/uL (ref 3.87–5.11)
RDW: 14.2 % (ref 11.5–15.5)
WBC: 6.8 10*3/uL (ref 4.0–10.5)

## 2019-06-30 LAB — COMPREHENSIVE METABOLIC PANEL
ALT: 9 U/L (ref 0–35)
AST: 18 U/L (ref 0–37)
Albumin: 4.1 g/dL (ref 3.5–5.2)
Alkaline Phosphatase: 84 U/L (ref 39–117)
BUN: 18 mg/dL (ref 6–23)
CO2: 29 mEq/L (ref 19–32)
Calcium: 9.7 mg/dL (ref 8.4–10.5)
Chloride: 104 mEq/L (ref 96–112)
Creatinine, Ser: 0.77 mg/dL (ref 0.40–1.20)
GFR: 70.6 mL/min (ref >60.00)
Glucose, Bld: 86 mg/dL (ref 70–99)
Potassium: 4.3 mEq/L (ref 3.5–5.1)
Sodium: 141 mEq/L (ref 135–145)
Total Bilirubin: 0.4 mg/dL (ref 0.2–1.2)
Total Protein: 6.9 g/dL (ref 6.0–8.3)

## 2019-06-30 LAB — TSH: TSH: 1.7 u[IU]/mL (ref 0.35–4.50)

## 2019-06-30 LAB — C-REACTIVE PROTEIN: CRP: <1 mg/dL (ref 0.5–20.0)

## 2019-06-30 LAB — SEDIMENTATION RATE: Sed Rate: 17 mm/hr (ref 0–30)

## 2019-07-01 ENCOUNTER — Other Ambulatory Visit: Payer: Self-pay

## 2019-07-01 MED ORDER — PANTOPRAZOLE SODIUM 40 MG PO TBEC: 40.0000 mg | DELAYED_RELEASE_TABLET | Freq: Every day | ORAL | 11 refills | Status: DC

## 2019-07-07 ENCOUNTER — Other Ambulatory Visit: Payer: Self-pay

## 2019-07-07 ENCOUNTER — Ambulatory Visit (HOSPITAL_COMMUNITY)
Admission: RE | Admit: 2019-07-07 | Discharge: 2019-07-07 | Disposition: A | Discharge status: Home / Self Care | Payer: PPO | Source: Ambulatory Visit | Attending: Gastroenterology | Admitting: Gastroenterology

## 2019-07-07 ENCOUNTER — Other Ambulatory Visit: Payer: Self-pay | Admitting: Gastroenterology

## 2019-07-07 DIAGNOSIS — M545 Low back pain: Secondary | ICD-10-CM | POA: Diagnosis not present

## 2019-07-07 DIAGNOSIS — K7689 Other specified diseases of liver: Secondary | ICD-10-CM | POA: Diagnosis not present

## 2019-07-07 DIAGNOSIS — K862 Cyst of pancreas: Secondary | ICD-10-CM | POA: Insufficient documentation

## 2019-07-07 MED ORDER — GADOBUTROL 1 MMOL/ML IV SOLN
5.0000 mL | Freq: Once | INTRAVENOUS | Status: AC | PRN
  Administered 2019-07-07: 11:00:00 5 mL via INTRAVENOUS

## 2019-07-12 ENCOUNTER — Telehealth (HOSPITAL_COMMUNITY): Payer: Self-pay

## 2019-07-12 DIAGNOSIS — S32010D Wedge compression fracture of first lumbar vertebra, subsequent encounter for fracture with routine healing: Secondary | ICD-10-CM | POA: Diagnosis not present

## 2019-07-12 DIAGNOSIS — M545 Low back pain: Secondary | ICD-10-CM | POA: Diagnosis not present

## 2019-07-12 NOTE — Telephone Encounter (Signed)
-----   Message from Ronney Lion, [place] sent at 07/12/2019  1:22 PM EST ----- Regarding: RE: New referral [facility],  I had Dr. Estanislado Pandy review. He has approved patient for an image-guided L1 KP/VP, first available. Please send bone bx request form to referring. Please schedule and please let me know if you have any questions.  Thanks! Radonna Ricker ----- Message ----- From: Danielle Dess Sent: 07/12/2019   1:00 PM EST To: Ronney Lion, PA-C Subject: New referral                                   [place],   New referral from Dr. Ron Agee for [place]. It was done over at [facility] so it's not in Epic. It is in [place]. Please have Dev review and let me know what you want to do.  Thanks,  Lia Foyer

## 2019-07-18 ENCOUNTER — Other Ambulatory Visit (HOSPITAL_COMMUNITY): Payer: Self-pay | Admitting: Interventional Radiology

## 2019-07-18 DIAGNOSIS — Z0000 Encounter for general adult medical examination without abnormal findings: Secondary | ICD-10-CM | POA: Diagnosis not present

## 2019-07-18 DIAGNOSIS — Z9181 History of falling: Secondary | ICD-10-CM | POA: Diagnosis not present

## 2019-07-18 DIAGNOSIS — E785 Hyperlipidemia, unspecified: Secondary | ICD-10-CM | POA: Diagnosis not present

## 2019-07-18 DIAGNOSIS — S32010A Wedge compression fracture of first lumbar vertebra, initial encounter for closed fracture: Secondary | ICD-10-CM

## 2019-07-18 DIAGNOSIS — Z1331 Encounter for screening for depression: Secondary | ICD-10-CM | POA: Diagnosis not present

## 2019-08-01 ENCOUNTER — Other Ambulatory Visit: Payer: Self-pay

## 2019-08-01 ENCOUNTER — Other Ambulatory Visit (HOSPITAL_COMMUNITY): Payer: Self-pay | Admitting: Interventional Radiology

## 2019-08-01 ENCOUNTER — Ambulatory Visit (HOSPITAL_COMMUNITY)
Admission: RE | Admit: 2019-08-01 | Discharge: 2019-08-01 | Disposition: A | Discharge status: Home / Self Care | Payer: PPO | Source: Ambulatory Visit | Attending: Interventional Radiology | Admitting: Interventional Radiology

## 2019-08-01 DIAGNOSIS — S32010A Wedge compression fracture of first lumbar vertebra, initial encounter for closed fracture: Secondary | ICD-10-CM

## 2019-08-01 NOTE — H&P (Signed)
Chief Complaint: Patient was seen in consultation today for L1 compression fracture at the request of Dr. Ron Agee  Referring Physician(s): Dr. Ron Agee  Supervising Physician: Luanne Bras  Patient Status: [facility] - Out-pt  History of Present Illness: Kathleen Morales is a 86 y.o. female who reported a fall several weeks ago. Ever since then, she has had low back pain. She was seen by Dr. Ron Agee and had an MRI which shows L1 fracture. She has been trying conservative management for the past few weeks with OTC pain relievers and restricting her activity. She still has pain up to 8/10 with some activities. Denies pain or paresthesias into her legs. Denies bowel or bladder dysfunction. She was referred for consideration of kyphoplasty procedure. PMHx, meds, labs, imaging, allergies reviewed. Feels well, no recent fevers, chills, illness. Husband at bedside.   Past Medical History:  Diagnosis Date  . Arthritis    takes Prednisone daily for inflammation  . Bruises easily   . Diverticulosis   . Fibromyalgia   . Gastric ulcer    hx of ;many yrs ago  . Hemorrhoids   . History of [place] spotted fever    Elevated titer 04/2006  . Hoarseness    pt states always like this;ENT can't find anything wrong  . Hx of colonic polyps   . Joint pain   . Joint swelling   . Leg pain   . Nocturia   . Palpitations   . Polymyalgia rheumatica ([place])   . Spine disorder    has had injections and wears a brace  . 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 Ulcerative colitis 1970   takes Colazal bid    Past Surgical History:  Procedure Laterality Date  . ABDOMINAL HYSTERECTOMY    . CARPAL TUNNEL RELEASE     bilateral  . CATARACT EXTRACTION, BILATERAL    . COLONOSCOPY    . CYSTECTOMY     Breast x 2-Right  . epidural injections     x 2   . PARTIAL HYSTERECTOMY     at age 73  . TOTAL KNEE ARTHROPLASTY  04/06/2012   Procedure: TOTAL KNEE ARTHROPLASTY;  Surgeon: [name], MD;  Location: [place];  Service: Orthopedics;  Laterality: Left;  . TOTAL KNEE ARTHROPLASTY Right 05/02/2014   Procedure: TOTAL KNEE ARTHROPLASTY;  Surgeon: [name], MD;  Location: [place];  Service: Orthopedics;  Laterality: Right;  . TUBAL LIGATION      Allergies: Xarelto [rivaroxaban]  Medications: Prior to Admission medications   Medication Sig Start Date End Date Taking? Authorizing Provider  balsalazide (COLAZAL) 750 MG capsule TAKE 3 CAPSULES BY MOUTH 3 TIMES DAILY 06/07/19   Ladene Artist, MD  Cholecalciferol (VITAMIN D3) 2000 UNITS TABS Take 2,000 Units by mouth daily.    [provider]  gabapentin (NEURONTIN) 300 MG capsule Take 1 capsule (300 mg total) by mouth 3 (three) times daily. Please call [phone] to schedule an appt. 04/16/18   Marcial Pacas, MD  omeprazole (PRILOSEC) 20 MG capsule Take 1 capsule (20 mg total) by mouth daily. 06/07/19   Ladene Artist, MD  pantoprazole (PROTONIX) 40 MG tablet Take 1 tablet (40 mg total) by mouth daily. 07/01/19   Ladene Artist, MD  traMADol (ULTRAM) 50 MG tablet TAKE 1 TABLET BY MOUTH EVERY 12 HOURS ASNEEDED 09/02/17   Marybelle Killings, MD  vitamin B-12 (CYANOCOBALAMIN) 1000 MCG tablet Take 1,000 mcg by mouth daily.    [provider]     Family History  Problem Relation Age of Onset  . Heart disease Mother   . Heart disease Father   . Breast cancer Sister   . Breast cancer Daughter   . Breast cancer Other        neice  . Colon polyps Other        nephew  . Colon cancer Neg Hx   . Rectal cancer Neg Hx   . Stomach cancer Neg Hx   . Esophageal cancer Neg Hx     Social History   Socioeconomic History  . Marital status: Widowed    Spouse name: Not on file  . Number of children: 4  . Years of education: HS  . Highest education level: Not on file  Occupational History  . Occupation: Retired    Fish farm manager: RETIRED  Tobacco Use  . Smoking status: Never Smoker  . Smokeless tobacco: Never Used   Substance and Sexual Activity  . Alcohol use: No    Alcohol/week: 0.0 standard drinks  . Drug use: No  . Sexual activity: Never    Birth control/protection: Post-menopausal  Other Topics Concern  . Not on file  Social History Narrative   Daily caffeine use 1-2 cup/day   Gets regular exercise   Right-handed.   Lives with a friend, [name].      Social Determinants of Health   Financial Resource Strain:   . Difficulty of Paying Living Expenses: Not on file  Food Insecurity:   . Worried About Charity fundraiser in the Last Year: Not on file  . Ran Out of Food in the Last Year: Not on file  Transportation Needs:   . Lack of Transportation (Medical): Not on file  . Lack of Transportation (Non-Medical): Not on file  Physical Activity:   . Days of Exercise per Week: Not on file  . Minutes of Exercise per Session: Not on file  Stress:   . Feeling of Stress : Not on file  Social Connections:   . Frequency of Communication with Friends and Family: Not on file  . Frequency of Social Gatherings with Friends and Family: Not on file  . Attends Religious Services: Not on file  . Active Member of Clubs or Organizations: Not on file  . Attends Archivist Meetings: Not on file  . Marital Status: Not on file    Review of Systems: A 12 point ROS discussed and pertinent positives are indicated in the HPI above.  All other systems are negative.  Review of Systems  Vital Signs: There were no vitals taken for this visit.  Physical Exam Constitutional:      Appearance: Normal appearance.  HENT:     Mouth/Throat:     Mouth: Mucous membranes are moist.     Pharynx: Oropharynx is clear.  Cardiovascular:     Rate and Rhythm: Normal rate and regular rhythm.     Heart sounds: Normal heart sounds.  Pulmonary:     Effort: Pulmonary effort is normal. No respiratory distress.     Breath sounds: Normal breath sounds.  Musculoskeletal:     Comments: Mildly tender along lumbar  paraspinous musculature No palpable defects  Skin:    General: Skin is warm and dry.  Neurological:     General: No focal deficit present.     Mental Status: She is alert and oriented to person, place, and time.     Cranial Nerves: No cranial nerve deficit.  Motor: No weakness.  Psychiatric:        Mood and Affect: Mood normal.        Thought Content: Thought content normal.        Judgment: Judgment normal.       Imaging: MR Abdomen W Wo Contrast  Result Date: 07/07/2019 CLINICAL DATA:  Evaluate pancreatic cyst. EXAM: MRI ABDOMEN WITHOUT AND WITH CONTRAST TECHNIQUE: Multiplanar multisequence MR imaging of the abdomen was performed both before and after the administration of intravenous contrast. CONTRAST:  85m GADAVIST GADOBUTROL 1 MMOL/ML IV SOLN COMPARISON:  09/23/2017 FINDINGS: Lower chest: No acute findings. Hepatobiliary: Cyst within posteromedial right lobe of liver measures 1.2 cm. No suspicious enhancing liver lesions. The gallbladder appears within normal limits. No gallstones or gallbladder wall inflammation. No biliary ductal dilatation. Pancreas: No pancreatic inflammation. The cystic lesion within the body of pancreas communicating with the main duct is again noted. On today's examination this measures 1.2 cm, image 102/5. Previously 1.2 cm. No internal septation or mural nodule. Adjacent cystic lesion is also unchanged measuring 5 mm. Within the tail of pancreas there is mild increase caliber of the CBD which measures up to 2.7 mm. Scattered areas of side branch duct ectasia appears similar to previous exam. Spleen:  Within normal limits in size and appearance. Adrenals/Urinary Tract: Normal appearance of the adrenal glands. No kidney mass or hydronephrosis identified. Stomach/Bowel: Visualized portions within the abdomen are unremarkable. Vascular/Lymphatic: No pathologically enlarged lymph nodes identified. No abdominal aortic aneurysm demonstrated. Other:  No free fluid or  fluid collections. Musculoskeletal: No suspicious bone lesions identified. IMPRESSION: 1. No significant change in size of cystic lesion within the body of pancreas. No internal septation or mural nodule identified. Based on size and age of patient no further follow-up is indicated at this time. This recommendation follows ACR consensus guidelines: Management of Incidental Pancreatic Cysts: A White Paper of the ACR Incidental Findings Committee. [id number];[id number] Electronically Signed   By: TKerby MoorsM.D.   On: 07/07/2019 12:37   MR 3D Recon At Scanner  Result Date: 07/07/2019 CLINICAL DATA:  Evaluate pancreatic cyst. EXAM: MRI ABDOMEN WITHOUT AND WITH CONTRAST TECHNIQUE: Multiplanar multisequence MR imaging of the abdomen was performed both before and after the administration of intravenous contrast. CONTRAST:  548mGADAVIST GADOBUTROL 1 MMOL/ML IV SOLN COMPARISON:  09/23/2017 FINDINGS: Lower chest: No acute findings. Hepatobiliary: Cyst within posteromedial right lobe of liver measures 1.2 cm. No suspicious enhancing liver lesions. The gallbladder appears within normal limits. No gallstones or gallbladder wall inflammation. No biliary ductal dilatation. Pancreas: No pancreatic inflammation. The cystic lesion within the body of pancreas communicating with the main duct is again noted. On today's examination this measures 1.2 cm, image 102/5. Previously 1.2 cm. No internal septation or mural nodule. Adjacent cystic lesion is also unchanged measuring 5 mm. Within the tail of pancreas there is mild increase caliber of the CBD which measures up to 2.7 mm. Scattered areas of side branch duct ectasia appears similar to previous exam. Spleen:  Within normal limits in size and appearance. Adrenals/Urinary Tract: Normal appearance of the adrenal glands. No kidney mass or hydronephrosis identified. Stomach/Bowel: Visualized portions within the abdomen are unremarkable. Vascular/Lymphatic: No  pathologically enlarged lymph nodes identified. No abdominal aortic aneurysm demonstrated. Other:  No free fluid or fluid collections. Musculoskeletal: No suspicious bone lesions identified. IMPRESSION: 1. No significant change in size of cystic lesion within the body of pancreas. No internal septation or mural nodule identified.  Based on size and age of patient no further follow-up is indicated at this time. This recommendation follows ACR consensus guidelines: Management of Incidental Pancreatic Cysts: A White Paper of the ACR Incidental Findings Committee. [place] [id number];[id number]. Electronically Signed   By: Kerby Moors M.D.   On: 07/07/2019 12:37    Labs:  CBC: Recent Labs    06/30/19 0924  WBC 6.8  HGB 13.3  HCT 40.3  PLT 332.0    COAGS: No results for input(s): INR, APTT in the last 8760 hours.  BMP: Recent Labs    06/30/19 0924  NA 141  K 4.3  CL 104  CO2 29  GLUCOSE 86  BUN 18  CALCIUM 9.7  CREATININE 0.77    LIVER FUNCTION TESTS: Recent Labs    06/30/19 0924  BILITOT 0.4  AST 18  ALT 9  ALKPHOS 84  PROT 6.9  ALBUMIN 4.1    TUMOR MARKERS: No results for input(s): AFPTM, CEA, CA199, CHROMGRNA in the last 8760 hours.  Assessment and Plan: Symptomatic L1 compression fracture, suspect benign due to underlying osteoporosis. Imaging reviewed. After discussion of treatment options including continuing conservative mgmt vs intervention, the pt and her husband elect to proceed with KP. Risks and benefits of kyphoplasty were discussed with the patient including, but not limited to education regarding the natural healing process of compression fractures without intervention, bleeding, infection, cement migration which may cause spinal cord damage, paralysis, pulmonary embolism or even death.  This interventional procedure involves the use of X-rays and because of the nature of the planned procedure, it is possible that we will have prolonged use of X-ray  fluoroscopy.  Potential radiation risks to you include (but are not limited to) the following: - A slightly elevated risk for cancer  several years later in life. This risk is typically less than 0.5% percent. This risk is low in comparison to the normal incidence of human cancer, which is 33% for women and 50% for men according to the [place]. - Radiation induced injury can include skin redness, resembling a rash, tissue breakdown / ulcers and hair loss (which can be temporary or permanent).   The likelihood of either of these occurring depends on the difficulty of the procedure and whether you are sensitive to radiation due to previous procedures, disease, or genetic conditions.   IF your procedure requires a prolonged use of radiation, you will be notified and given written instructions for further action.  It is your responsibility to monitor the irradiated area for the 2 weeks following the procedure and to notify your physician if you are concerned that you have suffered a radiation induced injury.    All of the patient's questions were answered, patient is agreeable to proceed.  Consent signed and in chart.    Thank you for this interesting consult.  I greatly enjoyed meeting Kathleen Morales and look forward to participating in their care.  A copy of this report was sent to the requesting provider on this date.  Electronically Signed: Ascencion Dike, PA-C 08/01/2019, 12:19 PM   I spent a total of 30 minutes in face to face in clinical consultation, greater than 50% of which was counseling/coordinating care for L1 compression fracture and treatment planning

## 2019-08-10 ENCOUNTER — Other Ambulatory Visit: Payer: Self-pay | Admitting: Radiology

## 2019-08-11 ENCOUNTER — Telehealth (HOSPITAL_COMMUNITY): Payer: Self-pay

## 2019-08-11 ENCOUNTER — Other Ambulatory Visit: Payer: Self-pay | Admitting: Radiology

## 2019-08-11 NOTE — Telephone Encounter (Signed)
Pt called back to confirm her appt time and instructions. Gave her all the info. AW

## 2019-08-12 ENCOUNTER — Other Ambulatory Visit: Payer: Self-pay

## 2019-08-12 ENCOUNTER — Ambulatory Visit (HOSPITAL_COMMUNITY)
Admission: RE | Admit: 2019-08-12 | Discharge: 2019-08-12 | Disposition: A | Discharge status: Home / Self Care | Payer: PPO | Source: Ambulatory Visit | Attending: Interventional Radiology | Admitting: Interventional Radiology

## 2019-08-12 DIAGNOSIS — M353 Polymyalgia rheumatica: Secondary | ICD-10-CM | POA: Insufficient documentation

## 2019-08-12 DIAGNOSIS — X58XXXA Exposure to other specified factors, initial encounter: Secondary | ICD-10-CM | POA: Diagnosis not present

## 2019-08-12 DIAGNOSIS — M797 Fibromyalgia: Secondary | ICD-10-CM | POA: Insufficient documentation

## 2019-08-12 DIAGNOSIS — M545 Low back pain: Secondary | ICD-10-CM | POA: Insufficient documentation

## 2019-08-12 DIAGNOSIS — S32010A Wedge compression fracture of first lumbar vertebra, initial encounter for closed fracture: Secondary | ICD-10-CM | POA: Diagnosis not present

## 2019-08-12 DIAGNOSIS — M4856XA Collapsed vertebra, not elsewhere classified, lumbar region, initial encounter for fracture: Secondary | ICD-10-CM | POA: Diagnosis not present

## 2019-08-12 DIAGNOSIS — Z79899 Other long term (current) drug therapy: Secondary | ICD-10-CM | POA: Diagnosis not present

## 2019-08-12 HISTORY — PX: IR VERTEBROPLASTY LUMBAR BX INC UNI/BIL INC/INJECT/IMAGING: IMG5516

## 2019-08-12 LAB — CBC
HCT: 45.9 % (ref 36.0–46.0)
Hemoglobin: 15.1 g/dL — ABNORMAL HIGH (ref 12.0–15.0)
MCH: 33.9 pg (ref 26.0–34.0)
MCHC: 32.9 g/dL (ref 30.0–36.0)
MCV: 103.1 fL — ABNORMAL HIGH (ref 80.0–100.0)
Platelets: 360 10*3/uL (ref 150–400)
RBC: 4.45 MIL/uL (ref 3.87–5.11)
RDW: 13 % (ref 11.5–15.5)
WBC: 7.3 10*3/uL (ref 4.0–10.5)
nRBC: 0 % (ref 0.0–0.2)

## 2019-08-12 LAB — BASIC METABOLIC PANEL
Anion gap: 10 (ref 5–15)
BUN: 12 mg/dL (ref 8–23)
CO2: 26 mmol/L (ref 22–32)
Calcium: 9.9 mg/dL (ref 8.9–10.3)
Chloride: 108 mmol/L (ref 98–111)
Creatinine, Ser: 0.74 mg/dL (ref 0.44–1.00)
GFR calc Af Amer: >60 mL/min (ref >60)
GFR calc non Af Amer: >60 mL/min (ref >60)
Glucose, Bld: 94 mg/dL (ref 70–99)
Potassium: 4.5 mmol/L (ref 3.5–5.1)
Sodium: 144 mmol/L (ref 135–145)

## 2019-08-12 LAB — URINALYSIS, COMPLETE (UACMP) WITH MICROSCOPIC
Bilirubin Urine: NEGATIVE
Glucose, UA: NEGATIVE mg/dL
Hgb urine dipstick: NEGATIVE
Ketones, ur: NEGATIVE mg/dL
Leukocytes,Ua: NEGATIVE
Nitrite: NEGATIVE
Protein, ur: NEGATIVE mg/dL
Specific Gravity, Urine: 1.008 (ref 1.005–1.030)
pH: 8 (ref 5.0–8.0)

## 2019-08-12 LAB — PROTIME-INR
INR: 1 (ref 0.8–1.2)
Prothrombin Time: 12.6 seconds (ref 11.4–15.2)

## 2019-08-12 MED ORDER — BUPIVACAINE HCL (PF) 0.5 % IJ SOLN
INTRAMUSCULAR | Status: AC
  Filled 2019-08-12: qty 30

## 2019-08-12 MED ORDER — SODIUM CHLORIDE 0.9 % IV SOLN: INTRAVENOUS | Status: DC

## 2019-08-12 MED ORDER — CEFAZOLIN SODIUM-DEXTROSE 2-4 GM/100ML-% IV SOLN
INTRAVENOUS | Status: AC
  Administered 2019-08-12: 2 g via INTRAVENOUS
  Filled 2019-08-12: qty 100

## 2019-08-12 MED ORDER — BUPIVACAINE HCL 0.25 % IJ SOLN
INTRAMUSCULAR | Status: AC | PRN
  Administered 2019-08-12: 15 mL

## 2019-08-12 MED ORDER — HYDRALAZINE HCL 20 MG/ML IJ SOLN
INTRAMUSCULAR | Status: AC
  Filled 2019-08-12: qty 1

## 2019-08-12 MED ORDER — CEFAZOLIN SODIUM-DEXTROSE 2-4 GM/100ML-% IV SOLN: 2.0000 g | INTRAVENOUS | Status: AC

## 2019-08-12 MED ORDER — MIDAZOLAM HCL 2 MG/2ML IJ SOLN
INTRAMUSCULAR | Status: AC
  Filled 2019-08-12: qty 2

## 2019-08-12 MED ORDER — GELATIN ABSORBABLE 12-7 MM EX MISC
CUTANEOUS | Status: AC
  Filled 2019-08-12: qty 1

## 2019-08-12 MED ORDER — FENTANYL CITRATE (PF) 100 MCG/2ML IJ SOLN
INTRAMUSCULAR | Status: AC
  Filled 2019-08-12: qty 2

## 2019-08-12 MED ORDER — TOBRAMYCIN SULFATE 1.2 G IJ SOLR
INTRAMUSCULAR | Status: AC
  Administered 2019-08-12: 0.1 mg
  Filled 2019-08-12: qty 1.2

## 2019-08-12 MED ORDER — SODIUM CHLORIDE 0.9 % IV SOLN: INTRAVENOUS | Status: AC

## 2019-08-12 MED ORDER — FENTANYL CITRATE (PF) 100 MCG/2ML IJ SOLN
INTRAMUSCULAR | Status: AC | PRN
  Administered 2019-08-12 (×2): 25 ug via INTRAVENOUS

## 2019-08-12 MED ORDER — IOHEXOL 300 MG/ML  SOLN
50.0000 mL | Freq: Once | INTRAMUSCULAR | Status: AC | PRN
  Administered 2019-08-12: 5 mL via ORAL

## 2019-08-12 MED ORDER — MIDAZOLAM HCL 2 MG/2ML IJ SOLN
INTRAMUSCULAR | Status: AC | PRN
  Administered 2019-08-12 (×2): 1 mg via INTRAVENOUS

## 2019-08-12 NOTE — Procedures (Signed)
S/P L1 VP S.Maytte Jacot MD

## 2019-08-12 NOTE — H&P (Signed)
Chief Complaint: Patient was seen in consultation today for L1 compression fracture/vertebral augmentation.  Referring Physician(s): Laroy Apple  Supervising Physician: Luanne Bras  Patient Status: [facility] - Out-pt  History of Present Illness: Kathleen Morales is a 86 y.o. female with a past medical history of gastric ulcer, hemorrhoids, ulcerative colitis, diverticulosis, RMSF, polymyalgia rheumatica, fibromyalgia, and arthritis. In 03/2019, she fell while she was trying to hop over a trench and developed low back pain. She was referred to orthopedics for further management. She underwent MRI lumbar spine 07/07/2019 which revealed an acute L1 compression fracture. She was originally treated conservatively, however pain persists. She was then referred to [facility] for possible vertebral augmentation.  NIR requested by Dr. Ron Agee for possible image-guided L1 kyphoplasty/vertebroplasty. Patient awake and alert sitting in bed. Complains of low back pain, rated 4/10 at this time. States that pain progresses throughout the day. Denies fever, chills, chest pain, dyspnea, abdominal pain, or headache.   Past Medical History:  Diagnosis Date  . Arthritis    takes Prednisone daily for inflammation  . Bruises easily   . Diverticulosis   . Fibromyalgia   . Gastric ulcer    hx of ;many yrs ago  . Hemorrhoids   . History of [place] spotted fever    Elevated titer 04/2006  . Hoarseness    pt states always like this;ENT can't find anything wrong  . Hx of colonic polyps   . Joint pain   . Joint swelling   . Leg pain   . Nocturia   . Palpitations   . Polymyalgia rheumatica ([place])   . Spine disorder    has had injections and wears a brace  . 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 Ulcerative colitis 1970   takes Colazal bid    Past Surgical History:  Procedure Laterality Date  . ABDOMINAL HYSTERECTOMY    . CARPAL TUNNEL RELEASE     bilateral  . CATARACT  EXTRACTION, BILATERAL    . COLONOSCOPY    . CYSTECTOMY     Breast x 2-Right  . epidural injections     x 2   . PARTIAL HYSTERECTOMY     at age 65  . TOTAL KNEE ARTHROPLASTY  04/06/2012   Procedure: TOTAL KNEE ARTHROPLASTY;  Surgeon: [name], MD;  Location: [place];  Service: Orthopedics;  Laterality: Left;  . TOTAL KNEE ARTHROPLASTY Right 05/02/2014   Procedure: TOTAL KNEE ARTHROPLASTY;  Surgeon: [name], MD;  Location: [place];  Service: Orthopedics;  Laterality: Right;  . TUBAL LIGATION      Allergies: Xarelto [rivaroxaban]  Medications: Prior to Admission medications   Medication Sig Start Date End Date Taking? Authorizing Provider  balsalazide (COLAZAL) 750 MG capsule TAKE 3 CAPSULES BY MOUTH 3 TIMES DAILY 06/07/19   Ladene Artist, MD  Cholecalciferol (VITAMIN D3) 2000 UNITS TABS Take 2,000 Units by mouth daily.    [provider]  gabapentin (NEURONTIN) 300 MG capsule Take 1 capsule (300 mg total) by mouth 3 (three) times daily. Please call [phone] to schedule an appt. 04/16/18   Marcial Pacas, MD  omeprazole (PRILOSEC) 20 MG capsule Take 1 capsule (20 mg total) by mouth daily. 06/07/19   Ladene Artist, MD  pantoprazole (PROTONIX) 40 MG tablet Take 1 tablet (40 mg total) by mouth daily. 07/01/19   Ladene Artist, MD  traMADol (ULTRAM) 50 MG tablet TAKE 1 TABLET BY MOUTH EVERY 12 HOURS ASNEEDED 09/02/17   Lorin Mercy,  Thana Farr, MD  vitamin B-12 (CYANOCOBALAMIN) 1000 MCG tablet Take 1,000 mcg by mouth daily.    [provider]     Family History  Problem Relation Age of Onset  . Heart disease Mother   . Heart disease Father   . Breast cancer Sister   . Breast cancer Daughter   . Breast cancer Other        neice  . Colon polyps Other        nephew  . Colon cancer Neg Hx   . Rectal cancer Neg Hx   . Stomach cancer Neg Hx   . Esophageal cancer Neg Hx     Social History   Socioeconomic History  . Marital status: Widowed    Spouse name: Not on  file  . Number of children: 4  . Years of education: HS  . Highest education level: Not on file  Occupational History  . Occupation: Retired    Fish farm manager: RETIRED  Tobacco Use  . Smoking status: Never Smoker  . Smokeless tobacco: Never Used  Substance and Sexual Activity  . Alcohol use: No    Alcohol/week: 0.0 standard drinks  . Drug use: No  . Sexual activity: Never    Birth control/protection: Post-menopausal  Other Topics Concern  . Not on file  Social History Narrative   Daily caffeine use 1-2 cup/day   Gets regular exercise   Right-handed.   Lives with a friend, [name].      Social Determinants of Health   Financial Resource Strain:   . Difficulty of Paying Living Expenses: Not on file  Food Insecurity:   . Worried About Charity fundraiser in the Last Year: Not on file  . Ran Out of Food in the Last Year: Not on file  Transportation Needs:   . Lack of Transportation (Medical): Not on file  . Lack of Transportation (Non-Medical): Not on file  Physical Activity:   . Days of Exercise per Week: Not on file  . Minutes of Exercise per Session: Not on file  Stress:   . Feeling of Stress : Not on file  Social Connections:   . Frequency of Communication with Friends and Family: Not on file  . Frequency of Social Gatherings with Friends and Family: Not on file  . Attends Religious Services: Not on file  . Active Member of Clubs or Organizations: Not on file  . Attends Archivist Meetings: Not on file  . Marital Status: Not on file     Review of Systems: A 12 point ROS discussed and pertinent positives are indicated in the HPI above.  All other systems are negative.  Review of Systems  Constitutional: Negative for chills and fever.  Respiratory: Negative for shortness of breath and wheezing.   Cardiovascular: Negative for chest pain and palpitations.  Gastrointestinal: Negative for abdominal pain.  Musculoskeletal: Positive for back pain.  Neurological:  Negative for headaches.  Psychiatric/Behavioral: Negative for behavioral problems and confusion.    Vital Signs: There were no vitals taken for this visit.  Physical Exam Vitals and nursing note reviewed.  Constitutional:      General: She is not in acute distress.    Appearance: Normal appearance.  Cardiovascular:     Rate and Rhythm: Normal rate and regular rhythm.     Heart sounds: Normal heart sounds. No murmur.  Pulmonary:     Effort: Pulmonary effort is normal. No respiratory distress.     Breath sounds: Normal breath  sounds. No wheezing.  Musculoskeletal:     Comments: Mild-moderate tenderness of midline spine at approximate level of L1.  Skin:    General: Skin is warm and dry.  Neurological:     Mental Status: She is alert and oriented to person, place, and time.  Psychiatric:        Mood and Affect: Mood normal.        Behavior: Behavior normal.      MD Evaluation Airway: WNL Heart: WNL Abdomen: WNL Chest/ Lungs: WNL ASA  Classification: 2 Mallampati/Airway Score: Two   Imaging: No results found.  Labs:  CBC: Recent Labs    06/30/19 0924  WBC 6.8  HGB 13.3  HCT 40.3  PLT 332.0    COAGS: No results for input(s): INR, APTT in the last 8760 hours.  BMP: Recent Labs    06/30/19 0924  NA 141  K 4.3  CL 104  CO2 29  GLUCOSE 86  BUN 18  CALCIUM 9.7  CREATININE 0.77    LIVER FUNCTION TESTS: Recent Labs    06/30/19 0924  BILITOT 0.4  AST 18  ALT 9  ALKPHOS 84  PROT 6.9  ALBUMIN 4.1     Assessment and Plan:  L1 compression fracture. Plan for image-guided L1 kyphoplasty/vertebroplasty with possible bone biopsy today in IR with Dr. Estanislado Pandy. Patient is NPO. Afebrile. She does not take blood thinners. INR pending.  Risks and benefits of L1 kyphoplasty/vertebroplasty were discussed with the patient including, but not limited to education regarding the natural healing process of compression fractures without intervention,  bleeding, infection, cement migration which may cause spinal cord damage, paralysis, pulmonary embolism or even death. This interventional procedure involves the use of X-rays and because of the nature of the planned procedure, it is possible that we will have prolonged use of X-ray fluoroscopy. Potential radiation risks to you include (but are not limited to) the following: - A slightly elevated risk for cancer  several years later in life. This risk is typically less than 0.5% percent. This risk is low in comparison to the normal incidence of human cancer, which is 33% for women and 50% for men according to the [place]. - Radiation induced injury can include skin redness, resembling a rash, tissue breakdown / ulcers and hair loss (which can be temporary or permanent).  The likelihood of either of these occurring depends on the difficulty of the procedure and whether you are sensitive to radiation due to previous procedures, disease, or genetic conditions.  IF your procedure requires a prolonged use of radiation, you will be notified and given written instructions for further action.  It is your responsibility to monitor the irradiated area for the 2 weeks following the procedure and to notify your physician if you are concerned that you have suffered a radiation induced injury. All of the patient's questions were answered, patient is agreeable to proceed. Consent signed and in chart.   Thank you for this interesting consult.  I greatly enjoyed meeting Kathleen TERRERO and look forward to participating in their care.  A copy of this report was sent to the requesting provider on this date.  Electronically Signed: Earley Abide, PA-C 08/12/2019, 10:53 AM   I spent a total of 40 Minutes in face to face in clinical consultation, greater than 50% of which was counseling/coordinating care for L1 compression fracture/vertebral augmentation.

## 2019-08-12 NOTE — Discharge Instructions (Signed)
1.No stooping,bending or lifting  More than 10 lbs for 2 weeks. 2.Use walker to ambulate for 2 weeks. 3.No driving for 2 weeks. 4.RTC PRN 2 weeks.   Balloon Kyphoplasty, Care After This sheet gives you information about how to care for yourself after your procedure. Your health care provider may also give you more specific instructions. If you have problems or questions, contact your health care provider. What can I expect after the procedure? After your procedure, it is common to have back pain. Follow these instructions at home: Medicines  Take over-the-counter and prescription medicines only as told by your health care provider.  Ask your health care provider if the medicine prescribed to you: ? Requires you to avoid driving or using heavy machinery. ? Can cause constipation. You may need to take steps to prevent or treat constipation, such as:  Drink enough fluid to keep your urine pale yellow.  Take over-the-counter or prescription medicines.  Eat foods that are high in fiber, such as beans, whole grains, and fresh fruits and vegetables.  Limit foods that are high in fat and processed sugars, such as fried or sweet foods. Puncture site care   Follow instructions from your health care provider about how to take care of your puncture site. Make sure you: ? Wash your hands with soap and water before and after you change your bandage (dressing). If soap and water are not available, use hand sanitizer. ? Change your dressing as told by your health care provider. ? Leave skin glue or adhesive strips in place. These skin closures may need to be in place for 2 weeks or longer. If adhesive strip edges start to loosen and curl up, you may trim the loose edges. Do not remove adhesive strips completely unless your health care provider tells you to do that.  Check your puncture site every day for signs of infection. Watch for: ? Redness, swelling, or pain. ? Fluid or  blood. ? Warmth. ? Pus or a bad smell.  Keep your dressing dry until your health care provider says that it can be removed. Managing pain, stiffness, and swelling   If directed, put ice on the painful area. ? Put ice in a plastic bag. ? Place a towel between your skin and the bag. ? Leave the ice on for 20 minutes, 2-3 times a day. Activity  Rest your back and avoid intense physical activity for as long as told by your health care provider.  Avoid bending, lifting, or twisting your back for as long as told by your health care provider.  Return to your normal activities as told by your health care provider. Ask your health care provider what activities are safe for you.  Do not lift anything that is heavier than 5 lb (2.2 kg). You may need to avoid heavy lifting for several weeks. General instructions  Do not use any products that contain nicotine or tobacco, such as cigarettes, e-cigarettes, and chewing tobacco. These can delay bone healing. If you need help quitting, ask your health care provider.  Do not drive for 24 hours if you were given a sedative during your procedure.  Keep all follow-up visits as told by your health care provider. This is important. Contact a health care provider if:  You have a fever or chills.  You have redness, swelling, or pain at the site of your puncture.  You have fluid, blood, or pus coming from the puncture site.  You have pain that gets worse  or does not get better with medicine.  You develop numbness or weakness in any part of your body. Get help right away if:  You have chest pain.  You have difficulty breathing.  You have weakness, numbness, or tingling in your legs.  You cannot control your bladder or bowel movements.  You suddenly become weak or numb on one side of your body.  You become very confused.  You have trouble speaking or understanding, or both. Summary  Follow instructions from your health care provider about  how to take care of your puncture site.  Take over-the-counter and prescription medicines only as told by your health care provider.  Rest your back and avoid intense physical activity for as long as told by your health care provider.  Contact a health care provider if you have pain that gets worse or does not get better with medicine.  Keep all follow-up visits as told by your health care provider. This is important. This information is not intended to replace advice given to you by your health care provider. Make sure you discuss any questions you have with your health care provider. Document Revised: 06/21/2018 Document Reviewed: 06/21/2018 Elsevier Patient Education  2020 [employer].

## 2019-08-15 ENCOUNTER — Other Ambulatory Visit (HOSPITAL_COMMUNITY): Payer: Self-pay | Admitting: Interventional Radiology

## 2019-08-15 DIAGNOSIS — S32010A Wedge compression fracture of first lumbar vertebra, initial encounter for closed fracture: Secondary | ICD-10-CM

## 2019-08-19 DIAGNOSIS — Z961 Presence of intraocular lens: Secondary | ICD-10-CM | POA: Diagnosis not present

## 2019-08-19 DIAGNOSIS — H52203 Unspecified astigmatism, bilateral: Secondary | ICD-10-CM | POA: Diagnosis not present

## 2019-08-19 DIAGNOSIS — H353131 Nonexudative age-related macular degeneration, bilateral, early dry stage: Secondary | ICD-10-CM | POA: Diagnosis not present

## 2019-09-20 DIAGNOSIS — M47816 Spondylosis without myelopathy or radiculopathy, lumbar region: Secondary | ICD-10-CM | POA: Diagnosis not present

## 2019-09-20 DIAGNOSIS — M545 Low back pain: Secondary | ICD-10-CM | POA: Diagnosis not present

## 2019-09-20 DIAGNOSIS — S32010D Wedge compression fracture of first lumbar vertebra, subsequent encounter for fracture with routine healing: Secondary | ICD-10-CM | POA: Diagnosis not present

## 2019-12-01 DIAGNOSIS — T380X5A Adverse effect of glucocorticoids and synthetic analogues, initial encounter: Secondary | ICD-10-CM | POA: Diagnosis not present

## 2019-12-01 DIAGNOSIS — M818 Other osteoporosis without current pathological fracture: Secondary | ICD-10-CM | POA: Diagnosis not present

## 2019-12-01 DIAGNOSIS — M545 Low back pain: Secondary | ICD-10-CM | POA: Diagnosis not present

## 2019-12-01 DIAGNOSIS — Z6823 Body mass index (BMI) 23.0-23.9, adult: Secondary | ICD-10-CM | POA: Diagnosis not present

## 2019-12-01 DIAGNOSIS — M159 Polyosteoarthritis, unspecified: Secondary | ICD-10-CM | POA: Diagnosis not present

## 2019-12-01 DIAGNOSIS — I10 Essential (primary) hypertension: Secondary | ICD-10-CM | POA: Diagnosis not present

## 2019-12-01 DIAGNOSIS — K5190 Ulcerative colitis, unspecified, without complications: Secondary | ICD-10-CM | POA: Diagnosis not present

## 2020-01-04 DIAGNOSIS — D485 Neoplasm of uncertain behavior of skin: Secondary | ICD-10-CM | POA: Diagnosis not present

## 2020-01-04 DIAGNOSIS — L821 Other seborrheic keratosis: Secondary | ICD-10-CM | POA: Diagnosis not present

## 2020-02-09 ENCOUNTER — Telehealth: Payer: Self-pay | Admitting: Gastroenterology

## 2020-02-09 ENCOUNTER — Other Ambulatory Visit (INDEPENDENT_AMBULATORY_CARE_PROVIDER_SITE_OTHER): Payer: PPO

## 2020-02-09 DIAGNOSIS — R197 Diarrhea, unspecified: Secondary | ICD-10-CM

## 2020-02-09 LAB — CBC
HCT: 40.6 % (ref 36.0–46.0)
Hemoglobin: 13.5 g/dL (ref 12.0–15.0)
MCHC: 33.3 g/dL (ref 30.0–36.0)
MCV: 100 fl (ref 78.0–100.0)
Platelets: 292 10*3/uL (ref 150.0–400.0)
RBC: 4.06 Mil/uL (ref 3.87–5.11)
RDW: 13.3 % (ref 11.5–15.5)
WBC: 8.8 10*3/uL (ref 4.0–10.5)

## 2020-02-09 LAB — SEDIMENTATION RATE: Sed Rate: 6 mm/hr (ref 0–30)

## 2020-02-09 NOTE — Telephone Encounter (Signed)
Patient reports that she has been having loose bowel movements and severe urgency for the last month.  No recent antibiotics, sick contacts, or recent travel.  She denies rectal bleeding.  Reports occasional cramping and lower abdominal pain.  Stool is "yellow and it floats".  She is maintained on her colazal 3 TID.

## 2020-02-09 NOTE — Telephone Encounter (Signed)
CBC, CMP, ESR, CRP GI pathogen panel Push fluids including Pedialyte or G2 Appt with me or APP

## 2020-02-09 NOTE — Telephone Encounter (Signed)
Patient notified Follow up arranged for 02/14/20 She will come for labs today or tomorrow. Verbalized understanding to push fluids.

## 2020-02-09 NOTE — Telephone Encounter (Signed)
Patient called states she can not control her BM please advise

## 2020-02-10 DIAGNOSIS — R197 Diarrhea, unspecified: Secondary | ICD-10-CM | POA: Diagnosis not present

## 2020-02-10 LAB — COMPREHENSIVE METABOLIC PANEL
ALT: 10 U/L (ref 0–35)
AST: 19 U/L (ref 0–37)
Albumin: 4.3 g/dL (ref 3.5–5.2)
Alkaline Phosphatase: 65 U/L (ref 39–117)
BUN: 21 mg/dL (ref 6–23)
CO2: 31 mEq/L (ref 19–32)
Calcium: 9.8 mg/dL (ref 8.4–10.5)
Chloride: 103 mEq/L (ref 96–112)
Creatinine, Ser: 0.91 mg/dL (ref 0.40–1.20)
GFR: 58.14 mL/min — ABNORMAL LOW (ref >60.00)
Glucose, Bld: 102 mg/dL — ABNORMAL HIGH (ref 70–99)
Potassium: 4.6 mEq/L (ref 3.5–5.1)
Sodium: 139 mEq/L (ref 135–145)
Total Bilirubin: 0.4 mg/dL (ref 0.2–1.2)
Total Protein: 6.8 g/dL (ref 6.0–8.3)

## 2020-02-10 LAB — C-REACTIVE PROTEIN: CRP: <1 mg/dL (ref 0.5–20.0)

## 2020-02-13 LAB — GI PROFILE, STOOL, PCR
Adenovirus F 40/41: NOT DETECTED
Astrovirus: NOT DETECTED
C difficile toxin A/B: NOT DETECTED
Campylobacter: NOT DETECTED
Cryptosporidium: NOT DETECTED
Cyclospora cayetanensis: NOT DETECTED
E coli O157: NOT DETECTED
Entamoeba histolytica: NOT DETECTED
Enteroaggregative E coli: NOT DETECTED
Enteropathogenic E coli: NOT DETECTED
Enterotoxigenic E coli: NOT DETECTED
Giardia lamblia: NOT DETECTED
Norovirus GI/GII: NOT DETECTED
Plesiomonas shigelloides: NOT DETECTED
Rotavirus A: NOT DETECTED
Salmonella: NOT DETECTED
Sapovirus: NOT DETECTED
Shiga-toxin-producing E coli: NOT DETECTED
Shigella/Enteroinvasive E coli: NOT DETECTED
Vibrio cholerae: NOT DETECTED
Vibrio: NOT DETECTED
Yersinia enterocolitica: NOT DETECTED

## 2020-02-14 ENCOUNTER — Ambulatory Visit: Payer: PPO | Admitting: Gastroenterology

## 2020-02-14 ENCOUNTER — Encounter: Payer: Self-pay | Admitting: Gastroenterology

## 2020-02-14 VITALS — BP 118/62 | HR 68 | Ht 61.0 in | Wt 124.4 lb

## 2020-02-14 DIAGNOSIS — R159 Full incontinence of feces: Secondary | ICD-10-CM

## 2020-02-14 DIAGNOSIS — R197 Diarrhea, unspecified: Secondary | ICD-10-CM | POA: Diagnosis not present

## 2020-02-14 MED ORDER — BUDESONIDE 3 MG PO CPEP
9.0000 mg | ORAL_CAPSULE | Freq: Every day | ORAL | 1 refills | Status: DC
Start: 2020-02-14 — End: 2020-03-08

## 2020-02-14 NOTE — Patient Instructions (Signed)
We have sent the following medications to your pharmacy for you to pick up at your convenience: budesonide 3 mg (3 capsules) by mouth daily x 8 weeks.  Start a lactose free diet.   Call in 10-14 days if your symptoms are not better.  Thank you for choosing me and [facility].  Pricilla Riffle. Dagoberto Ligas., MD., [facility]

## 2020-02-14 NOTE — Progress Notes (Signed)
    History of Present Illness: This is an 86 year old female complaining of diarrhea and frequent fecal incontinence for 6-8 weeks.  She is accompanied by her husband. She has a history of left sided ulcerative colitis. CBC, CMP, ESR, CRP, GI stool profile obtained last week were all unremarkable.   Current Medications, Allergies, Past Medical History, Past Surgical History, Family History and Social History were reviewed in [employer] record.   Physical Exam: General: Well developed, well nourished, no acute distress Head: Normocephalic and atraumatic Eyes:  sclerae anicteric, EOMI Ears: Normal auditory acuity Mouth: Not examined, mask on during Covid-19 pandemic Lungs: Clear throughout to auscultation Heart: Regular rate and rhythm; no murmurs, rubs or bruits Abdomen: Soft, non tender and non distended. No masses, hepatosplenomegaly or hernias noted. Normal Bowel sounds Rectal: Not done Musculoskeletal: Symmetrical with no gross deformities  Pulses:  Normal pulses noted Extremities: No clubbing, cyanosis, edema or deformities noted Neurological: Alert oriented x 4, grossly nonfocal Psychological:  Alert and cooperative. Normal mood and affect   Assessment and Recommendations:  1.  Left-sided ulcerative colitis with persistent diarrhea and frequent fecal incontinence. Lactose free diet for 7 days and assess response. Entocort 9 mg po qd 8 weeks. Colonoscopy if symptoms not well controlled. REV in 1 month.

## 2020-03-08 ENCOUNTER — Other Ambulatory Visit: Payer: Self-pay | Admitting: Gastroenterology

## 2020-03-20 ENCOUNTER — Ambulatory Visit: Payer: PPO | Admitting: Gastroenterology

## 2020-03-20 ENCOUNTER — Encounter: Payer: Self-pay | Admitting: Gastroenterology

## 2020-03-20 VITALS — BP 108/68 | HR 75 | Ht 61.0 in | Wt 123.0 lb

## 2020-03-20 DIAGNOSIS — K51519 Left sided colitis with unspecified complications: Secondary | ICD-10-CM | POA: Diagnosis not present

## 2020-03-20 NOTE — Progress Notes (Signed)
    History of Present Illness: This is an 86 year old female with left-sided ulcerative colitis returning for follow-up.  She is accompanied by her husband.  She states her diarrhea has improved and she now is having 2 soft bowel movements per day.  She has not fully returned to her normal bowel pattern.  Appetite is good and her weight is stable.  Current Medications, Allergies, Past Medical History, Past Surgical History, Family History and Social History were reviewed in [employer] record.   Physical Exam: General: Well developed, well nourished, no acute distress Head: Normocephalic and atraumatic Eyes:  sclerae anicteric, EOMI Ears: Normal auditory acuity Mouth: Not examined, mask on during Covid-19 pandemic Lungs: Clear throughout to auscultation Heart: Regular rate and rhythm; no murmurs, rubs or bruits Abdomen: Soft, non tender and non distended. No masses, hepatosplenomegaly or hernias noted. Normal Bowel sounds Rectal: Not done Musculoskeletal: Symmetrical with no gross deformities  Pulses:  Normal pulses noted Extremities: No clubbing, cyanosis, edema or deformities noted Neurological: Alert oriented x 4, grossly nonfocal Psychological:  Alert and cooperative. Normal mood and affect   Assessment and Recommendations:  1.  Left-sided ulcerative colitis with recent flare improving on Entocort and Colazal.  Taper Entocort to 6 mg daily for 7 days, then 3 mg daily for 7 days and then discontinue.  Discontinue magnesium for 1 week and assess effect on stools.  Continue balsalazide at 3 tablets 3 times daily.  Call for worsening of GI symptoms. REV in 3 months.

## 2020-03-20 NOTE — Patient Instructions (Signed)
Taper your budesonide to 6 mg (2 tablets) by mouth daily x 1 week, then reduce to 3 mg (1 tablet) by mouth daily x 1 week, then stop.   Stay on current dose of Colazal.   Try holding magnesium x 1 week to see if this helps reduce your diarrhea symptoms.   Thank you for choosing me and [facility].  Pricilla Riffle. Dagoberto Ligas., MD., [facility]

## 2020-03-22 ENCOUNTER — Telehealth: Payer: Self-pay | Admitting: Gastroenterology

## 2020-03-22 NOTE — Telephone Encounter (Signed)
Left a message notifying patient's daughter that I emailed the insurance info back to her.

## 2020-03-23 NOTE — Telephone Encounter (Signed)
Sent to wrong person, Message sent to Dr. Fuller Plan CMA

## 2020-03-23 NOTE — Telephone Encounter (Signed)
Left message informing patient's daughter that I sent email again and to let us know if she does not receive it.

## 2020-06-04 DIAGNOSIS — Z79899 Other long term (current) drug therapy: Secondary | ICD-10-CM | POA: Diagnosis not present

## 2020-06-04 DIAGNOSIS — M159 Polyosteoarthritis, unspecified: Secondary | ICD-10-CM | POA: Diagnosis not present

## 2020-06-04 DIAGNOSIS — M545 Low back pain, unspecified: Secondary | ICD-10-CM | POA: Diagnosis not present

## 2020-06-04 DIAGNOSIS — K5190 Ulcerative colitis, unspecified, without complications: Secondary | ICD-10-CM | POA: Diagnosis not present

## 2020-06-04 DIAGNOSIS — Z139 Encounter for screening, unspecified: Secondary | ICD-10-CM | POA: Diagnosis not present

## 2020-06-04 DIAGNOSIS — M818 Other osteoporosis without current pathological fracture: Secondary | ICD-10-CM | POA: Diagnosis not present

## 2020-06-04 DIAGNOSIS — Z23 Encounter for immunization: Secondary | ICD-10-CM | POA: Diagnosis not present

## 2020-06-04 DIAGNOSIS — G4700 Insomnia, unspecified: Secondary | ICD-10-CM | POA: Diagnosis not present

## 2020-06-04 DIAGNOSIS — Z6823 Body mass index (BMI) 23.0-23.9, adult: Secondary | ICD-10-CM | POA: Diagnosis not present

## 2020-06-04 DIAGNOSIS — T380X5A Adverse effect of glucocorticoids and synthetic analogues, initial encounter: Secondary | ICD-10-CM | POA: Diagnosis not present

## 2020-06-13 DIAGNOSIS — Z20822 Contact with and (suspected) exposure to covid-19: Secondary | ICD-10-CM | POA: Diagnosis not present

## 2020-07-07 ENCOUNTER — Other Ambulatory Visit: Payer: Self-pay | Admitting: Gastroenterology

## 2020-07-07 DIAGNOSIS — K51819 Other ulcerative colitis with unspecified complications: Secondary | ICD-10-CM

## 2020-08-22 DIAGNOSIS — H353131 Nonexudative age-related macular degeneration, bilateral, early dry stage: Secondary | ICD-10-CM | POA: Diagnosis not present

## 2020-08-22 DIAGNOSIS — Z961 Presence of intraocular lens: Secondary | ICD-10-CM | POA: Diagnosis not present

## 2020-08-22 DIAGNOSIS — H52203 Unspecified astigmatism, bilateral: Secondary | ICD-10-CM | POA: Diagnosis not present

## 2020-12-04 DIAGNOSIS — Z79899 Other long term (current) drug therapy: Secondary | ICD-10-CM | POA: Diagnosis not present

## 2020-12-04 DIAGNOSIS — Z1331 Encounter for screening for depression: Secondary | ICD-10-CM | POA: Diagnosis not present

## 2020-12-04 DIAGNOSIS — T380X5A Adverse effect of glucocorticoids and synthetic analogues, initial encounter: Secondary | ICD-10-CM | POA: Diagnosis not present

## 2020-12-04 DIAGNOSIS — Z6823 Body mass index (BMI) 23.0-23.9, adult: Secondary | ICD-10-CM | POA: Diagnosis not present

## 2020-12-04 DIAGNOSIS — M159 Polyosteoarthritis, unspecified: Secondary | ICD-10-CM | POA: Diagnosis not present

## 2020-12-04 DIAGNOSIS — M818 Other osteoporosis without current pathological fracture: Secondary | ICD-10-CM | POA: Diagnosis not present

## 2020-12-04 DIAGNOSIS — K5190 Ulcerative colitis, unspecified, without complications: Secondary | ICD-10-CM | POA: Diagnosis not present

## 2020-12-04 DIAGNOSIS — Z9181 History of falling: Secondary | ICD-10-CM | POA: Diagnosis not present

## 2020-12-04 DIAGNOSIS — M545 Low back pain, unspecified: Secondary | ICD-10-CM | POA: Diagnosis not present

## 2020-12-11 DIAGNOSIS — M25532 Pain in left wrist: Secondary | ICD-10-CM | POA: Diagnosis not present

## 2020-12-11 DIAGNOSIS — M19032 Primary osteoarthritis, left wrist: Secondary | ICD-10-CM | POA: Diagnosis not present

## 2020-12-25 DIAGNOSIS — D485 Neoplasm of uncertain behavior of skin: Secondary | ICD-10-CM | POA: Diagnosis not present

## 2020-12-25 DIAGNOSIS — L821 Other seborrheic keratosis: Secondary | ICD-10-CM | POA: Diagnosis not present

## 2020-12-26 DIAGNOSIS — H903 Sensorineural hearing loss, bilateral: Secondary | ICD-10-CM | POA: Diagnosis not present

## 2021-01-23 DIAGNOSIS — C44612 Basal cell carcinoma of skin of right upper limb, including shoulder: Secondary | ICD-10-CM | POA: Diagnosis not present

## 2021-02-25 DIAGNOSIS — Z79899 Other long term (current) drug therapy: Secondary | ICD-10-CM | POA: Diagnosis not present

## 2021-02-25 DIAGNOSIS — M159 Polyosteoarthritis, unspecified: Secondary | ICD-10-CM | POA: Diagnosis not present

## 2021-02-25 DIAGNOSIS — M8000XD Age-related osteoporosis with current pathological fracture, unspecified site, subsequent encounter for fracture with routine healing: Secondary | ICD-10-CM | POA: Diagnosis not present

## 2021-02-25 DIAGNOSIS — M353 Polymyalgia rheumatica: Secondary | ICD-10-CM | POA: Diagnosis not present

## 2021-03-12 DIAGNOSIS — L820 Inflamed seborrheic keratosis: Secondary | ICD-10-CM | POA: Diagnosis not present

## 2021-05-01 DIAGNOSIS — N3090 Cystitis, unspecified without hematuria: Secondary | ICD-10-CM | POA: Diagnosis not present

## 2021-05-01 DIAGNOSIS — N3001 Acute cystitis with hematuria: Secondary | ICD-10-CM | POA: Diagnosis not present

## 2021-05-20 DIAGNOSIS — L03115 Cellulitis of right lower limb: Secondary | ICD-10-CM | POA: Diagnosis not present

## 2021-06-11 DIAGNOSIS — K5190 Ulcerative colitis, unspecified, without complications: Secondary | ICD-10-CM | POA: Diagnosis not present

## 2021-06-11 DIAGNOSIS — Z23 Encounter for immunization: Secondary | ICD-10-CM | POA: Diagnosis not present

## 2021-06-11 DIAGNOSIS — N952 Postmenopausal atrophic vaginitis: Secondary | ICD-10-CM | POA: Diagnosis not present

## 2021-06-11 DIAGNOSIS — M353 Polymyalgia rheumatica: Secondary | ICD-10-CM | POA: Diagnosis not present

## 2021-06-11 DIAGNOSIS — M545 Low back pain, unspecified: Secondary | ICD-10-CM | POA: Diagnosis not present

## 2021-06-11 DIAGNOSIS — N950 Postmenopausal bleeding: Secondary | ICD-10-CM | POA: Diagnosis not present

## 2021-06-11 DIAGNOSIS — Z139 Encounter for screening, unspecified: Secondary | ICD-10-CM | POA: Diagnosis not present

## 2021-06-11 DIAGNOSIS — Z6824 Body mass index (BMI) 24.0-24.9, adult: Secondary | ICD-10-CM | POA: Diagnosis not present

## 2021-06-11 DIAGNOSIS — M818 Other osteoporosis without current pathological fracture: Secondary | ICD-10-CM | POA: Diagnosis not present

## 2021-06-11 DIAGNOSIS — Z79899 Other long term (current) drug therapy: Secondary | ICD-10-CM | POA: Diagnosis not present

## 2021-06-11 DIAGNOSIS — M159 Polyosteoarthritis, unspecified: Secondary | ICD-10-CM | POA: Diagnosis not present

## 2021-06-11 DIAGNOSIS — T380X5A Adverse effect of glucocorticoids and synthetic analogues, initial encounter: Secondary | ICD-10-CM | POA: Diagnosis not present

## 2021-07-09 ENCOUNTER — Other Ambulatory Visit: Payer: Self-pay | Admitting: Gastroenterology

## 2021-07-09 DIAGNOSIS — K51819 Other ulcerative colitis with unspecified complications: Secondary | ICD-10-CM

## 2021-07-26 DIAGNOSIS — Z6824 Body mass index (BMI) 24.0-24.9, adult: Secondary | ICD-10-CM | POA: Diagnosis not present

## 2021-07-26 DIAGNOSIS — U071 COVID-19: Secondary | ICD-10-CM | POA: Diagnosis not present

## 2021-08-26 ENCOUNTER — Other Ambulatory Visit: Payer: Self-pay | Admitting: Gastroenterology

## 2021-08-26 DIAGNOSIS — K51819 Other ulcerative colitis with unspecified complications: Secondary | ICD-10-CM

## 2021-08-28 ENCOUNTER — Telehealth: Payer: Self-pay | Admitting: Gastroenterology

## 2021-08-28 NOTE — Telephone Encounter (Signed)
Informed patient that Dr. Fuller Plan wants to discuss the medication changes in person before switching medications. Offered patient appt opening tomorrow 08/29/21 at 2:30 pm to discuss changes. Patient agreed and verbalized understanding.

## 2021-08-28 NOTE — Telephone Encounter (Signed)
Patient states her prescription for balsalazide is now over $100 a month. Patient states her insurance company told her that the preferred medication is sulfasalazine. Patient would like to be switched to sulfasalazine. Informed patient she is over due for follow up visit. Patient scheduled appt for 09/18/21 at 10:50 am. Please advise Dr. Fuller Plan if patient can be switched.

## 2021-08-28 NOTE — Telephone Encounter (Signed)
Please advise her she can reduce her dose of balsalazide to 2 po tid or 2 po bid until her REV if this help stretch out her current prescription. At her REV we will review the pros/cons to changing to sulfasalazine.

## 2021-08-28 NOTE — Telephone Encounter (Signed)
Left message for patient to return my call.

## 2021-08-28 NOTE — Telephone Encounter (Signed)
Patient called and stated that she wanted to speak with you about a mix up in her proscription of Balsalazide. Seeking advice, please advise.

## 2021-08-28 NOTE — Telephone Encounter (Signed)
Please advise Dr. Fuller Plan patient states she does not have any balsalazide left. She is completely out of medication at this time.

## 2021-08-28 NOTE — Telephone Encounter (Signed)
The discussion about sulfasalazine is involved and I would like to do that in person. She should refill her balsalazide and remain on it until her appt.

## 2021-08-29 ENCOUNTER — Encounter: Payer: Self-pay | Admitting: Gastroenterology

## 2021-08-29 ENCOUNTER — Ambulatory Visit: Payer: PPO | Admitting: Gastroenterology

## 2021-08-29 VITALS — BP 110/70 | HR 57 | Ht 62.0 in | Wt 126.4 lb

## 2021-08-29 DIAGNOSIS — K51519 Left sided colitis with unspecified complications: Secondary | ICD-10-CM | POA: Diagnosis not present

## 2021-08-29 MED ORDER — FOLIC ACID 1 MG PO TABS: 1.0000 mg | ORAL_TABLET | Freq: Every day | ORAL | 1 refills | Status: DC

## 2021-08-29 MED ORDER — SULFASALAZINE 500 MG PO TABS: 1000.0000 mg | ORAL_TABLET | Freq: Two times a day (BID) | ORAL | 1 refills | Status: DC

## 2021-08-29 NOTE — Patient Instructions (Addendum)
We have sent the following medications to your pharmacy for you to pick up at your convenience: sulfasalazine 500 mg (1 tablet)  twice daily x 1 week, then increase to 1000 mg (2 tablets) twice daily long-term. Also, folic acid 1 mg daily.   Please follow up with Dr. Fuller Plan in 6 months.   Thank you for choosing me and [facility].  Pricilla Riffle. Dagoberto Ligas., MD., [facility]

## 2021-08-29 NOTE — Progress Notes (Signed)
° ° °  History of Present Illness: This is a 88 year old female returning for follow-up of left-sided ulcerative colitis.  She is accompanied by her husband.  She relates her insurance coverage has changed for balsalazide which will increase the price to $100.  She is interested in considering a change to sulfasalazine which will be $15.  She has no ongoing gastrointestinal complaints.  Current Medications, Allergies, Past Medical History, Past Surgical History, Family History and Social History were reviewed in [employer] record.   Physical Exam: General: Well developed, well nourished, no acute distress Head: Normocephalic and atraumatic Eyes: Sclerae anicteric, EOMI Ears: Normal auditory acuity Mouth: Not examined, mask on during Covid-19 pandemic Lungs: Clear throughout to auscultation Heart: Regular rate and rhythm; no murmurs, rubs or bruits Abdomen: Soft, non tender and non distended. No masses, hepatosplenomegaly or hernias noted. Normal Bowel sounds Rectal: Not done Musculoskeletal: Symmetrical with no gross deformities  Pulses:  Normal pulses noted Extremities: No clubbing, cyanosis, edema or deformities noted Neurological: Alert oriented x 4, grossly nonfocal Psychological:  Alert and cooperative. Normal mood and affect   Assessment and Recommendations:  Left sided ulcerative colitis.  We discussed the potential side effects and efficacy of sulfasalazine relative to mesalamine.  She understands the risks, benefits and prefers to try sulfasalazine.  Obtain blood work from her PCP to hopefully include at least a CBC and be met.  Begin sulfasalazine 500 mg p.o. twice daily for 1 week and then increase to 1000 mg p.o. twice daily.  Begin folic acid 1 mg daily long-term.  She is advised to call if she has nausea headache or other symptoms or if her colitis symptoms are not well controlled.  REV in 6 months.

## 2021-08-30 DIAGNOSIS — Z961 Presence of intraocular lens: Secondary | ICD-10-CM | POA: Diagnosis not present

## 2021-08-30 DIAGNOSIS — H52203 Unspecified astigmatism, bilateral: Secondary | ICD-10-CM | POA: Diagnosis not present

## 2021-09-18 ENCOUNTER — Ambulatory Visit: Payer: PPO | Admitting: Gastroenterology

## 2021-12-16 DIAGNOSIS — G5601 Carpal tunnel syndrome, right upper limb: Secondary | ICD-10-CM | POA: Diagnosis not present

## 2021-12-16 DIAGNOSIS — M818 Other osteoporosis without current pathological fracture: Secondary | ICD-10-CM | POA: Diagnosis not present

## 2021-12-16 DIAGNOSIS — M545 Low back pain, unspecified: Secondary | ICD-10-CM | POA: Diagnosis not present

## 2021-12-16 DIAGNOSIS — T380X5A Adverse effect of glucocorticoids and synthetic analogues, initial encounter: Secondary | ICD-10-CM | POA: Diagnosis not present

## 2021-12-16 DIAGNOSIS — M159 Polyosteoarthritis, unspecified: Secondary | ICD-10-CM | POA: Diagnosis not present

## 2021-12-16 DIAGNOSIS — Z139 Encounter for screening, unspecified: Secondary | ICD-10-CM | POA: Diagnosis not present

## 2021-12-16 DIAGNOSIS — Z79899 Other long term (current) drug therapy: Secondary | ICD-10-CM | POA: Diagnosis not present

## 2021-12-16 DIAGNOSIS — I358 Other nonrheumatic aortic valve disorders: Secondary | ICD-10-CM | POA: Diagnosis not present

## 2021-12-16 DIAGNOSIS — M353 Polymyalgia rheumatica: Secondary | ICD-10-CM | POA: Diagnosis not present

## 2021-12-16 DIAGNOSIS — Z9181 History of falling: Secondary | ICD-10-CM | POA: Diagnosis not present

## 2021-12-16 DIAGNOSIS — K5190 Ulcerative colitis, unspecified, without complications: Secondary | ICD-10-CM | POA: Diagnosis not present

## 2021-12-16 DIAGNOSIS — Z1331 Encounter for screening for depression: Secondary | ICD-10-CM | POA: Diagnosis not present

## 2021-12-19 ENCOUNTER — Telehealth: Payer: Self-pay

## 2021-12-19 NOTE — Telephone Encounter (Signed)
error 

## 2021-12-24 ENCOUNTER — Encounter: Payer: Self-pay | Admitting: Internal Medicine

## 2021-12-24 DIAGNOSIS — I350 Nonrheumatic aortic (valve) stenosis: Secondary | ICD-10-CM | POA: Diagnosis not present

## 2021-12-24 DIAGNOSIS — I361 Nonrheumatic tricuspid (valve) insufficiency: Secondary | ICD-10-CM | POA: Diagnosis not present

## 2021-12-24 DIAGNOSIS — I358 Other nonrheumatic aortic valve disorders: Secondary | ICD-10-CM | POA: Diagnosis not present

## 2022-01-01 DIAGNOSIS — D485 Neoplasm of uncertain behavior of skin: Secondary | ICD-10-CM | POA: Diagnosis not present

## 2022-01-13 DIAGNOSIS — R051 Acute cough: Secondary | ICD-10-CM | POA: Diagnosis not present

## 2022-01-13 DIAGNOSIS — R0602 Shortness of breath: Secondary | ICD-10-CM | POA: Diagnosis not present

## 2022-01-13 DIAGNOSIS — N3001 Acute cystitis with hematuria: Secondary | ICD-10-CM | POA: Diagnosis not present

## 2022-01-13 DIAGNOSIS — N3000 Acute cystitis without hematuria: Secondary | ICD-10-CM | POA: Diagnosis not present

## 2022-01-13 DIAGNOSIS — R300 Dysuria: Secondary | ICD-10-CM | POA: Diagnosis not present

## 2022-02-10 DIAGNOSIS — M353 Polymyalgia rheumatica: Secondary | ICD-10-CM | POA: Diagnosis not present

## 2022-02-10 DIAGNOSIS — I10 Essential (primary) hypertension: Secondary | ICD-10-CM | POA: Diagnosis not present

## 2022-02-10 DIAGNOSIS — J209 Acute bronchitis, unspecified: Secondary | ICD-10-CM | POA: Diagnosis not present

## 2022-02-10 DIAGNOSIS — R059 Cough, unspecified: Secondary | ICD-10-CM | POA: Diagnosis not present

## 2022-02-12 DIAGNOSIS — C44629 Squamous cell carcinoma of skin of left upper limb, including shoulder: Secondary | ICD-10-CM | POA: Diagnosis not present

## 2022-02-25 DIAGNOSIS — S66802A Unspecified injury of other specified muscles, fascia and tendons at wrist and hand level, left hand, initial encounter: Secondary | ICD-10-CM | POA: Diagnosis not present

## 2022-02-25 DIAGNOSIS — I10 Essential (primary) hypertension: Secondary | ICD-10-CM | POA: Diagnosis not present

## 2022-03-03 ENCOUNTER — Other Ambulatory Visit: Payer: Self-pay

## 2022-03-03 MED ORDER — FOLIC ACID 1 MG PO TABS: 1.0000 mg | ORAL_TABLET | Freq: Every day | ORAL | 0 refills | Status: DC

## 2022-03-10 DIAGNOSIS — G5601 Carpal tunnel syndrome, right upper limb: Secondary | ICD-10-CM | POA: Diagnosis not present

## 2022-03-10 DIAGNOSIS — M79642 Pain in left hand: Secondary | ICD-10-CM | POA: Diagnosis not present

## 2022-03-10 DIAGNOSIS — M66849 Spontaneous rupture of other tendons, unspecified hand: Secondary | ICD-10-CM | POA: Diagnosis not present

## 2022-03-10 DIAGNOSIS — M79641 Pain in right hand: Secondary | ICD-10-CM | POA: Diagnosis not present

## 2022-03-10 DIAGNOSIS — M25532 Pain in left wrist: Secondary | ICD-10-CM | POA: Diagnosis not present

## 2022-03-17 DIAGNOSIS — M79642 Pain in left hand: Secondary | ICD-10-CM | POA: Diagnosis not present

## 2022-03-24 DIAGNOSIS — M66849 Spontaneous rupture of other tendons, unspecified hand: Secondary | ICD-10-CM | POA: Diagnosis not present

## 2022-03-24 DIAGNOSIS — M13839 Other specified arthritis, unspecified wrist: Secondary | ICD-10-CM | POA: Diagnosis not present

## 2022-03-24 DIAGNOSIS — G5602 Carpal tunnel syndrome, left upper limb: Secondary | ICD-10-CM | POA: Diagnosis not present

## 2022-03-24 DIAGNOSIS — M13842 Other specified arthritis, left hand: Secondary | ICD-10-CM | POA: Diagnosis not present

## 2022-03-24 DIAGNOSIS — M13849 Other specified arthritis, unspecified hand: Secondary | ICD-10-CM | POA: Diagnosis not present

## 2022-06-02 ENCOUNTER — Other Ambulatory Visit: Payer: Self-pay | Admitting: Gastroenterology

## 2022-06-16 ENCOUNTER — Other Ambulatory Visit: Payer: Self-pay

## 2022-06-16 MED ORDER — FOLIC ACID 1 MG PO TABS: 1.0000 mg | ORAL_TABLET | Freq: Every day | ORAL | 1 refills | Status: AC

## 2022-07-03 DIAGNOSIS — M353 Polymyalgia rheumatica: Secondary | ICD-10-CM | POA: Diagnosis not present

## 2022-07-03 DIAGNOSIS — K5190 Ulcerative colitis, unspecified, without complications: Secondary | ICD-10-CM | POA: Diagnosis not present

## 2022-07-03 DIAGNOSIS — I10 Essential (primary) hypertension: Secondary | ICD-10-CM | POA: Diagnosis not present

## 2022-07-03 DIAGNOSIS — G8929 Other chronic pain: Secondary | ICD-10-CM | POA: Diagnosis not present

## 2022-07-03 DIAGNOSIS — M1990 Unspecified osteoarthritis, unspecified site: Secondary | ICD-10-CM | POA: Diagnosis not present

## 2022-08-04 ENCOUNTER — Encounter: Payer: Self-pay | Admitting: Gastroenterology

## 2022-08-04 ENCOUNTER — Ambulatory Visit: Payer: PPO | Admitting: Gastroenterology

## 2022-08-04 ENCOUNTER — Other Ambulatory Visit (INDEPENDENT_AMBULATORY_CARE_PROVIDER_SITE_OTHER): Payer: PPO

## 2022-08-04 VITALS — BP 124/72 | HR 56 | Ht 60.0 in | Wt 116.0 lb

## 2022-08-04 DIAGNOSIS — K5150 Left sided colitis without complications: Secondary | ICD-10-CM | POA: Diagnosis not present

## 2022-08-04 LAB — COMPREHENSIVE METABOLIC PANEL
ALT: 12 U/L (ref 0–35)
AST: 27 U/L (ref 0–37)
Albumin: 4.2 g/dL (ref 3.5–5.2)
Alkaline Phosphatase: 71 U/L (ref 39–117)
BUN: 15 mg/dL (ref 6–23)
CO2: 28 mEq/L (ref 19–32)
Calcium: 9.6 mg/dL (ref 8.4–10.5)
Chloride: 99 mEq/L (ref 96–112)
Creatinine, Ser: 0.82 mg/dL (ref 0.40–1.20)
GFR: 62.28 mL/min (ref >60.00)
Glucose, Bld: 92 mg/dL (ref 70–99)
Potassium: 4.8 mEq/L (ref 3.5–5.1)
Sodium: 134 mEq/L — ABNORMAL LOW (ref 135–145)
Total Bilirubin: 0.4 mg/dL (ref 0.2–1.2)
Total Protein: 7.3 g/dL (ref 6.0–8.3)

## 2022-08-04 LAB — CBC WITH DIFFERENTIAL/PLATELET
Basophils Absolute: 0 10*3/uL (ref 0.0–0.1)
Basophils Relative: 0.3 % (ref 0.0–3.0)
Eosinophils Absolute: 0 10*3/uL (ref 0.0–0.7)
Eosinophils Relative: 0.3 % (ref 0.0–5.0)
HCT: 42.2 % (ref 36.0–46.0)
Hemoglobin: 14 g/dL (ref 12.0–15.0)
Lymphocytes Relative: 36.6 % (ref 12.0–46.0)
Lymphs Abs: 2.1 10*3/uL (ref 0.7–4.0)
MCHC: 33.2 g/dL (ref 30.0–36.0)
MCV: 101 fl — ABNORMAL HIGH (ref 78.0–100.0)
Monocytes Absolute: 1 10*3/uL (ref 0.1–1.0)
Monocytes Relative: 17.6 % — ABNORMAL HIGH (ref 3.0–12.0)
Neutro Abs: 2.6 10*3/uL (ref 1.4–7.7)
Neutrophils Relative %: 45.2 % (ref 43.0–77.0)
Platelets: 266 10*3/uL (ref 150.0–400.0)
RBC: 4.18 Mil/uL (ref 3.87–5.11)
RDW: 13.7 % (ref 11.5–15.5)
WBC: 5.8 10*3/uL (ref 4.0–10.5)

## 2022-08-04 MED ORDER — SULFASALAZINE 500 MG PO TABS: 1000.0000 mg | ORAL_TABLET | Freq: Two times a day (BID) | ORAL | 3 refills | Status: DC

## 2022-08-04 NOTE — Progress Notes (Addendum)
    Assessment     Left sided ulcerative colitis, asymptomatic Flatus incontinence   Recommendations    CBC, CMP, TSH today Continue sulfasalazine 1 g po twice daily and folic acid 1 mg po daily Kegel exercises 5 times daily long-term.  If symptoms not controlled consider PT referral REV in 1 year   HPI    This is a 89 year old female returning for follow-up of left-sided ulcerative colitis.  She is accompanied by her husband.  She relates no ongoing gastrointestinal complaints except it is often difficult for her to hold flatus.   Labs / Imaging       Latest Ref Rng & Units 02/09/2020    5:09 PM 06/30/2019    9:24 AM 01/05/2017    2:51 PM  Hepatic Function  Total Protein 6.0 - 8.3 g/dL 6.8  6.9  6.4   Albumin 3.5 - 5.2 g/dL 4.3  4.1  4.0   AST 0 - 37 U/L '19  18  16   '$ ALT 0 - 35 U/L '10  9  13   '$ Alk Phosphatase 39 - 117 U/L 65  84  63   Total Bilirubin 0.2 - 1.2 mg/dL 0.4  0.4  0.6        Latest Ref Rng & Units 02/09/2020    5:09 PM 08/12/2019   11:39 AM 06/30/2019    9:24 AM  CBC  WBC 4.0 - 10.5 K/uL 8.8  7.3  6.8   Hemoglobin 12.0 - 15.0 g/dL 13.5  15.1  13.3   Hematocrit 36.0 - 46.0 % 40.6  45.9  40.3   Platelets 150.0 - 400.0 K/uL 292.0  360  332.0    Current Medications, Allergies, Past Medical History, Past Surgical History, Family History and Social History were reviewed in [employer] record.   Physical Exam: General: Well developed, well nourished, elderly, no acute distress Head: Normocephalic and atraumatic Eyes: Sclerae anicteric, EOMI Ears: Normal auditory acuity Mouth: No deformities or lesions noted Lungs: Clear throughout to auscultation Heart: Regular rate and rhythm; No murmurs, rubs or bruits Abdomen: Soft, non tender and non distended. No masses, hepatosplenomegaly or hernias noted. Normal Bowel sounds Rectal: Not done Musculoskeletal: Symmetrical with no gross deformities  Pulses:  Normal pulses noted Extremities: No  edema or deformities noted Neurological: Alert oriented x 4, grossly nonfocal Psychological:  Alert and cooperative. Normal mood and affect   Suriah Peragine T. Fuller Plan, MD 08/04/2022, 3:08 PM

## 2022-08-04 NOTE — Patient Instructions (Signed)
Your provider has requested that you go to the basement level for lab work before leaving today. Press "B" on the elevator. The lab is located at the first door on the left as you exit the elevator.  We have sent the following medications to your pharmacy for you to pick up at your convenience: sulfasalazine.   The [facility] providers would like to encourage you to use [facility] to communicate with providers for non-urgent requests or questions.  Due to long hold times on the telephone, sending your provider a message by [facility] may be a faster and more efficient way to get a response.  Please allow 48 business hours for a response.  Please remember that this is for non-urgent requests.   Thank you for choosing me and [facility].  Pricilla Riffle. Dagoberto Ligas., MD., [facility]  Kegel Exercises  Kegel exercises can help strengthen your pelvic floor muscles. The pelvic floor is a group of muscles that support your rectum, small intestine, and bladder. In females, pelvic floor muscles also help support the uterus. These muscles help you control the flow of urine and stool (feces). Kegel exercises are painless and simple. They do not require any equipment. Your provider may suggest Kegel exercises to: Improve bladder and bowel control. Improve sexual response. Improve weak pelvic floor muscles after surgery to remove the uterus (hysterectomy) or after pregnancy, in females. Improve weak pelvic floor muscles after prostate gland removal or surgery, in males. Kegel exercises involve squeezing your pelvic floor muscles. These are the same muscles you squeeze when you try to stop the flow of urine or keep from passing gas. The exercises can be done while sitting, standing, or lying down, but it is best to vary your position. Ask your health care provider which exercises are safe for you. Do exercises exactly as told by your health care provider and adjust them as directed. Do not begin these exercises  until told by your health care provider. Exercises How to do Kegel exercises: Squeeze your pelvic floor muscles tight. You should feel a tight lift in your rectal area. If you are a female, you should also feel a tightness in your vaginal area. Keep your stomach, buttocks, and legs relaxed. Hold the muscles tight for up to 10 seconds. Breathe normally. Relax your muscles for up to 10 seconds. Repeat as told by your health care provider. Repeat this exercise daily as told by your health care provider. Continue to do this exercise for at least 4-6 weeks, or for as long as told by your health care provider. You may be referred to a physical therapist who can help you learn more about how to do Kegel exercises. Depending on your condition, your health care provider may recommend: Varying how long you squeeze your muscles. Doing several sets of exercises every day. Doing exercises for several weeks. Making Kegel exercises a part of your regular exercise routine. This information is not intended to replace advice given to you by your health care provider. Make sure you discuss any questions you have with your health care provider. Document Revised: 11/22/2020 Document Reviewed: 11/22/2020 Elsevier Patient Education  [employer].

## 2022-08-27 DIAGNOSIS — M353 Polymyalgia rheumatica: Secondary | ICD-10-CM | POA: Diagnosis not present

## 2022-08-27 DIAGNOSIS — M545 Low back pain, unspecified: Secondary | ICD-10-CM | POA: Diagnosis not present

## 2022-08-27 DIAGNOSIS — K5190 Ulcerative colitis, unspecified, without complications: Secondary | ICD-10-CM | POA: Diagnosis not present

## 2022-08-27 DIAGNOSIS — M818 Other osteoporosis without current pathological fracture: Secondary | ICD-10-CM | POA: Diagnosis not present

## 2022-08-27 DIAGNOSIS — I10 Essential (primary) hypertension: Secondary | ICD-10-CM | POA: Diagnosis not present

## 2022-08-27 DIAGNOSIS — I350 Nonrheumatic aortic (valve) stenosis: Secondary | ICD-10-CM | POA: Diagnosis not present

## 2022-08-27 DIAGNOSIS — M159 Polyosteoarthritis, unspecified: Secondary | ICD-10-CM | POA: Diagnosis not present

## 2022-08-27 DIAGNOSIS — T380X5A Adverse effect of glucocorticoids and synthetic analogues, initial encounter: Secondary | ICD-10-CM | POA: Diagnosis not present

## 2022-09-17 DIAGNOSIS — Z961 Presence of intraocular lens: Secondary | ICD-10-CM | POA: Diagnosis not present

## 2022-09-17 DIAGNOSIS — H52203 Unspecified astigmatism, bilateral: Secondary | ICD-10-CM | POA: Diagnosis not present

## 2022-12-08 DIAGNOSIS — M79641 Pain in right hand: Secondary | ICD-10-CM | POA: Diagnosis not present

## 2022-12-08 DIAGNOSIS — M13842 Other specified arthritis, left hand: Secondary | ICD-10-CM | POA: Diagnosis not present

## 2022-12-08 DIAGNOSIS — G5603 Carpal tunnel syndrome, bilateral upper limbs: Secondary | ICD-10-CM | POA: Diagnosis not present

## 2022-12-08 DIAGNOSIS — M13839 Other specified arthritis, unspecified wrist: Secondary | ICD-10-CM | POA: Diagnosis not present

## 2023-01-23 DIAGNOSIS — M545 Low back pain, unspecified: Secondary | ICD-10-CM | POA: Diagnosis not present

## 2023-01-31 DIAGNOSIS — M545 Low back pain, unspecified: Secondary | ICD-10-CM | POA: Diagnosis not present

## 2023-02-09 DIAGNOSIS — M545 Low back pain, unspecified: Secondary | ICD-10-CM | POA: Diagnosis not present

## 2023-02-18 DIAGNOSIS — M47816 Spondylosis without myelopathy or radiculopathy, lumbar region: Secondary | ICD-10-CM | POA: Diagnosis not present

## 2023-02-27 DIAGNOSIS — Z9181 History of falling: Secondary | ICD-10-CM | POA: Diagnosis not present

## 2023-02-27 DIAGNOSIS — K5190 Ulcerative colitis, unspecified, without complications: Secondary | ICD-10-CM | POA: Diagnosis not present

## 2023-02-27 DIAGNOSIS — Z139 Encounter for screening, unspecified: Secondary | ICD-10-CM | POA: Diagnosis not present

## 2023-02-27 DIAGNOSIS — M353 Polymyalgia rheumatica: Secondary | ICD-10-CM | POA: Diagnosis not present

## 2023-02-27 DIAGNOSIS — Z1331 Encounter for screening for depression: Secondary | ICD-10-CM | POA: Diagnosis not present

## 2023-02-27 DIAGNOSIS — M159 Polyosteoarthritis, unspecified: Secondary | ICD-10-CM | POA: Diagnosis not present

## 2023-02-27 DIAGNOSIS — T380X5A Adverse effect of glucocorticoids and synthetic analogues, initial encounter: Secondary | ICD-10-CM | POA: Diagnosis not present

## 2023-02-27 DIAGNOSIS — M545 Low back pain, unspecified: Secondary | ICD-10-CM | POA: Diagnosis not present

## 2023-02-27 DIAGNOSIS — R002 Palpitations: Secondary | ICD-10-CM | POA: Diagnosis not present

## 2023-02-27 DIAGNOSIS — M818 Other osteoporosis without current pathological fracture: Secondary | ICD-10-CM | POA: Diagnosis not present

## 2023-02-27 DIAGNOSIS — I10 Essential (primary) hypertension: Secondary | ICD-10-CM | POA: Diagnosis not present

## 2023-03-16 DIAGNOSIS — M533 Sacrococcygeal disorders, not elsewhere classified: Secondary | ICD-10-CM | POA: Diagnosis not present

## 2023-06-05 ENCOUNTER — Ambulatory Visit (INDEPENDENT_AMBULATORY_CARE_PROVIDER_SITE_OTHER): Payer: PPO

## 2023-06-05 ENCOUNTER — Encounter: Payer: Self-pay | Admitting: Cardiovascular Disease

## 2023-06-05 ENCOUNTER — Ambulatory Visit: Payer: PPO | Attending: Cardiovascular Disease | Admitting: Cardiovascular Disease

## 2023-06-05 VITALS — BP 124/68 | HR 73 | Ht 60.0 in | Wt 114.8 lb

## 2023-06-05 DIAGNOSIS — R002 Palpitations: Secondary | ICD-10-CM

## 2023-06-05 DIAGNOSIS — I10 Essential (primary) hypertension: Secondary | ICD-10-CM | POA: Diagnosis not present

## 2023-06-05 DIAGNOSIS — I350 Nonrheumatic aortic (valve) stenosis: Secondary | ICD-10-CM

## 2023-06-05 NOTE — Patient Instructions (Addendum)
Medication Instructions:  No changes *If you need a refill on your cardiac medications before your next appointment, please call your pharmacy*  Testing/Procedures: You can look into the [facility] device by [employer]. This device is purchased by you and it connects to an application you download to your smart phone.  It can detect abnormal heart rhythms and alert you to contact your doctor for further evaluation. The web site is:  https://www.alivecor.com   Your physician has requested that you have an echocardiogram. Echocardiography is a painless test that uses sound waves to create images of your heart. It provides your doctor with information about the size and shape of your heart and how well your heart's chambers and valves are working. This procedure takes approximately one hour. There are no restrictions for this procedure. Please do NOT wear cologne, perfume, aftershave, or lotions (deodorant is allowed). Please arrive 15 minutes prior to your appointment time.  Please note: We ask at that you not bring children with you during ultrasound (echo/ vascular) testing. Due to room size and safety concerns, children are not allowed in the ultrasound rooms during exams. Our front office staff cannot provide observation of children in our lobby area while testing is being conducted. An adult accompanying a patient to their appointment will only be allowed in the ultrasound room at the discretion of the ultrasound technician under special circumstances. We apologize for any inconvenience.   Your physician has recommended that you wear a 14 DAY ZIO-PATCH monitor. The Zio patch cardiac monitor continuously records heart rhythm data for up to 14 days, this is for patients being evaluated for multiple types heart rhythms. For the first 24 hours post application, please avoid getting the Zio monitor wet in the shower or by excessive sweating during exercise. After that, feel free to carry on with  regular activities. Keep soaps and lotions away from the ZIO XT Patch.  This will be mailed to you, please expect 7-10 days to receive.    Applying the monitor   Shave hair from upper left chest.   Hold abrader disc by orange tab.  Rub abrader in 40 strokes over left upper chest as indicated in your monitor instructions.   Clean area with 4 enclosed alcohol pads .  Use all pads to assure are is cleaned thoroughly.  Let dry.   Apply patch as indicated in monitor instructions.  Patch will be place under collarbone on left side of chest with arrow pointing upward.   Rub patch adhesive wings for 2 minutes.Remove white label marked "1".  Remove white label marked "2".  Rub patch adhesive wings for 2 additional minutes.   While looking in a mirror, press and release button in center of patch.  A small green light will flash 3-4 times .  This will be your only indicator the monitor has been turned on.     Do not shower for the first 24 hours.  You may shower after the first 24 hours.   Press button if you feel a symptom. You will hear a small click.  Record Date, Time and Symptom in the Patient Log Book.   When you are ready to remove patch, follow instructions on last 2 pages of Patient Log Book.  Stick patch monitor onto last page of Patient Log Book.   Place Patient Log Book in [place] box.  Use locking tab on box and tape box closed securely.  The [employer] and [employer] has [employer] on it.  Please place in mailbox as soon as possible.  Your physician should have your test results approximately 7 days after the monitor has been mailed back to [facility].   Call [facility] Customer Care at [phone] if you have questions regarding your ZIO XT patch monitor.  Call them immediately if you see an orange light blinking on your monitor.   If your monitor falls off in less than 4 days contact our Monitor department at [phone].  If your monitor becomes loose or falls off after 4  days call [employer] at [phone] for suggestions on securing your monitor    Follow-Up: At [facility], you and your health needs are our priority.  As part of our continuing mission to provide you with exceptional heart care, we have created designated Provider Care Teams.  These Care Teams include your primary Cardiologist (physician) and Advanced Practice Providers (APPs -  Physician Assistants and Nurse Practitioners) who all work together to provide you with the care you need, when you need it.  We recommend signing up for the patient portal called "MyChart".  Sign up information is provided on this After Visit Summary.  MyChart is used to connect with patients for Virtual Visits (Telemedicine).  Patients are able to view lab/test results, encounter notes, upcoming appointments, etc.  Non-urgent messages can be sent to your provider as well.   To learn more about what you can do with MyChart, go to [email or web address].    Your next appointment:   1 year(s)  Provider:   Dr Royann Shivers

## 2023-06-05 NOTE — Progress Notes (Unsigned)
Enrolled patient for a 14 day Zio XT  monitor to be mailed to patients home  °

## 2023-06-07 NOTE — Progress Notes (Signed)
Cardiology Office Note:    Date:  06/07/2023   ID:  Kathleen Morales, DOB 1928-09-10, MRN [id number]  PCP:  Lonie Peak, PA-C   [facility] Providers Cardiologist:  None     Referring MD: Lonie Peak, PA-C   No chief complaint on file. Kathleen Morales is a 89 y.o. female who is being seen today for the evaluation of palpitations at the request of Lonie Peak, Cordelia Poche.   History of Present Illness:    Kathleen Morales is a 89 y.o. female with a hx of HTN and palpitations, but without any known structural heart abnormalities.   She describes her palpitations as a sensation of her heart "flying".  Sometimes this will happen twice in 1 night but she has not had any of these events in the last 2 weeks.  Most spells last for less than a minute.  When they occur she has a sensation of "sinking away" and feels a little short of breath, but she has never had full-blown syncope.  She does not have exertional dyspnea, orthopnea or PND and is remarkably active for her age.  She denies chest pain at rest with activity.  In 2023 she had an echocardiogram at [facility] that showed normal left ventricular systolic function, mild aortic stenosis with a mean gradient of 11 mmHg, dimensionless index 0.44.  The left atrium was described as normal in size.  The images are not available for review.  In 2018 she wore an event monitor that showed no significant arrhythmia.  Her long term friend is Fayrene Fearing "Jake Shark" Jeraldine Loots who is also my patient.  Past Medical History:  Diagnosis Date   Arthritis    takes Prednisone daily for inflammation   Bruises easily    Diverticulosis    Fibromyalgia    Gastric ulcer    hx of ;many yrs ago   Hemorrhoids    History of [place] spotted fever    Elevated titer 04/2006   Hoarseness    pt states always like this;ENT can't find anything wrong   Hx of colonic polyps    Joint pain    Joint swelling    Leg pain    Nocturia     Palpitations    Polymyalgia rheumatica (HCC)    Spine disorder    has had injections and wears a brace   Tubulovillous adenoma polyp of colon 07/1996   Ulcerative colitis 1970   takes Colazal bid    Past Surgical History:  Procedure Laterality Date   ABDOMINAL HYSTERECTOMY     CARPAL TUNNEL RELEASE     bilateral   CATARACT EXTRACTION, BILATERAL     COLONOSCOPY     CYSTECTOMY     Breast x 2-Right   epidural injections     x 2    IR VERTEBROPLASTY LUMBAR BX INC UNI/BIL INC/INJECT/IMAGING  08/12/2019   PARTIAL HYSTERECTOMY     at age 36   TOTAL KNEE ARTHROPLASTY  04/06/2012   Procedure: TOTAL KNEE ARTHROPLASTY;  Surgeon: Valeria Batman, MD;  Location: [facility] OR;  Service: Orthopedics;  Laterality: Left;   TOTAL KNEE ARTHROPLASTY Right 05/02/2014   Procedure: TOTAL KNEE ARTHROPLASTY;  Surgeon: Valeria Batman, MD;  Location: [facility] OR;  Service: Orthopedics;  Laterality: Right;   TUBAL LIGATION      Current Medications: Current Meds  Medication Sig   alendronate (FOSAMAX) 10 MG tablet Take 10 mg by mouth every other day.   amLODipine (NORVASC) 5 MG tablet  Take 5 mg by mouth daily.   ascorbic acid (VITAMIN C) 500 MG tablet Take 500 mg by mouth daily.   cholecalciferol (VITAMIN D3) 25 MCG (1000 UNIT) tablet Take 1,000 Units by mouth daily.   folic acid (FOLVITE) 1 MG tablet Take 1 tablet (1 mg total) by mouth daily. (Patient taking differently: Take 1,000 mg by mouth daily.)   gabapentin (NEURONTIN) 300 MG capsule Take 1 capsule (300 mg total) by mouth 3 (three) times daily. Please call [phone] to schedule an appt.   MAGNESIUM PO Take 1 capsule by mouth daily.   predniSONE (DELTASONE) 5 MG tablet Take 5 mg by mouth daily.   sulfaSALAzine (AZULFIDINE) 500 MG tablet Take 2 tablets (1,000 mg total) by mouth 2 (two) times daily.   vitamin B-12 (CYANOCOBALAMIN) 1000 MCG tablet Take 1,000 mcg by mouth daily.   Zinc 50 MG TABS Take 50 mg by mouth daily at 6 (six) AM.     Allergies:    Xarelto [rivaroxaban]   Social History   Socioeconomic History   Marital status: Widowed    Spouse name: Not on file   Number of children: 4   Years of education: HS   Highest education level: Not on file  Occupational History   Occupation: Retired    Associate Professor: RETIRED  Tobacco Use   Smoking status: Never   Smokeless tobacco: Never  Vaping Use   Vaping status: Never Used  Substance and Sexual Activity   Alcohol use: No    Alcohol/week: 0.0 standard drinks of alcohol   Drug use: No   Sexual activity: Never    Birth control/protection: Post-menopausal  Other Topics Concern   Not on file  Social History Narrative   Daily caffeine use 1-2 cup/day   Gets regular exercise   Right-handed.   Lives with a friend, Valentino Saxon.      Social Determinants of Health   Financial Resource Strain: Not on file  Food Insecurity: Not on file  Transportation Needs: Not on file  Physical Activity: Not on file  Stress: Not on file  Social Connections: Not on file     Family History: The patient's family history includes Breast cancer in her daughter, sister, and another family member; Colon polyps in an other family member; Heart disease in her father and mother. There is no history of Colon cancer, Rectal cancer, Stomach cancer, or Esophageal cancer.  ROS:   Please see the history of present illness.     All other systems reviewed and are negative.  EKGs/Labs/Other Studies Reviewed:    The following studies were reviewed today:  EKG Interpretation Date/Time:  Friday June 05 2023 11:17:40 EST Ventricular Rate:  73 PR Interval:  178 QRS Duration:  72 QT Interval:  376 QTC Calculation: 414 R Axis:   -31  Text Interpretation: Normal sinus rhythm Left axis deviation When compared with ECG of 24-Mar-2013 14:23, QRS axis Shifted left Confirmed by Andi Mahaffy ([id number]) on 06/05/2023 11:48:43 AM    Recent Labs: 08/04/2022: ALT 12; BUN 15; Creatinine, Ser 0.82; Hemoglobin 14.0;  Platelets 266.0; Potassium 4.8; Sodium 134  Recent Lipid Panel No results found for: "CHOL", "TRIG", "HDL", "CHOLHDL", "VLDL", "LDLCALC", "LDLDIRECT"   Risk Assessment/Calculations:            Physical Exam:    VS:  BP 124/68 (BP Location: Left Arm, Patient Position: Sitting, Cuff Size: Normal)   Pulse 73   Ht 5' (1.524 m)   Wt 114 lb 12.8 oz (52.1 kg)  SpO2 94%   BMI 22.42 kg/m     Wt Readings from Last 3 Encounters:  06/05/23 114 lb 12.8 oz (52.1 kg)  08/04/22 116 lb (52.6 kg)  08/29/21 126 lb 6 oz (57.3 kg)     GEN: Appears younger than stated age, lean and fit, well nourished, well developed in no acute distress HEENT: Normal NECK: No JVD; No carotid bruits LYMPHATICS: No lymphadenopathy CARDIAC: RRR, preserved second heart sound, 2/6 early-mid peaking aortic ejection murmur, no diastolic murmurs, rubs, gallops RESPIRATORY:  Clear to auscultation without rales, wheezing or rhonchi  ABDOMEN: Soft, non-tender, non-distended MUSCULOSKELETAL:  No edema; No deformity  SKIN: Warm and dry NEUROLOGIC:  Alert and oriented x 3 PSYCHIATRIC:  Normal affect   ASSESSMENT:    1. Palpitations   2. Nonrheumatic aortic valve stenosis   3. Essential hypertension    PLAN:    In order of problems listed above:  Palpitations: These are unpredictable and brief but fairly symptomatic.  Will try again to catch them an event monitor.  Also advise getting a personal electronic rhythm monitoring device such as a smart watch or Kardia device. AS: Mild by the report from roughly 18 months ago.  Will repeat an echocardiogram. HTN: Controlled on amlodipine 5 mg daily.  We could consider switching to a centrally acting calcium channel blocker such as diltiazem if we identify atrial arrhythmia on her monitor.           Medication Adjustments/Labs and Tests Ordered: Current medicines are reviewed at length with the patient today.  Concerns regarding medicines are outlined above.  Orders  Placed This Encounter  Procedures   LONG TERM MONITOR (3-14 DAYS)   EKG 12-Lead   ECHOCARDIOGRAM COMPLETE   No orders of the defined types were placed in this encounter.   Patient Instructions  Medication Instructions:  No changes *If you need a refill on your cardiac medications before your next appointment, please call your pharmacy*  Testing/Procedures: You can look into the [facility] device by [employer]. This device is purchased by you and it connects to an application you download to your smart phone.  It can detect abnormal heart rhythms and alert you to contact your doctor for further evaluation. The web site is:  https://www.alivecor.com   Your physician has requested that you have an echocardiogram. Echocardiography is a painless test that uses sound waves to create images of your heart. It provides your doctor with information about the size and shape of your heart and how well your heart's chambers and valves are working. This procedure takes approximately one hour. There are no restrictions for this procedure. Please do NOT wear cologne, perfume, aftershave, or lotions (deodorant is allowed). Please arrive 15 minutes prior to your appointment time.  Please note: We ask at that you not bring children with you during ultrasound (echo/ vascular) testing. Due to room size and safety concerns, children are not allowed in the ultrasound rooms during exams. Our front office staff cannot provide observation of children in our lobby area while testing is being conducted. An adult accompanying a patient to their appointment will only be allowed in the ultrasound room at the discretion of the ultrasound technician under special circumstances. We apologize for any inconvenience.   Your physician has recommended that you wear a 14 DAY ZIO-PATCH monitor. The Zio patch cardiac monitor continuously records heart rhythm data for up to 14 days, this is for patients being evaluated for multiple  types heart rhythms. For the  first 24 hours post application, please avoid getting the Zio monitor wet in the shower or by excessive sweating during exercise. After that, feel free to carry on with regular activities. Keep soaps and lotions away from the ZIO XT Patch.  This will be mailed to you, please expect 7-10 days to receive.    Applying the monitor   Shave hair from upper left chest.   Hold abrader disc by orange tab.  Rub abrader in 40 strokes over left upper chest as indicated in your monitor instructions.   Clean area with 4 enclosed alcohol pads .  Use all pads to assure are is cleaned thoroughly.  Let dry.   Apply patch as indicated in monitor instructions.  Patch will be place under collarbone on left side of chest with arrow pointing upward.   Rub patch adhesive wings for 2 minutes.Remove white label marked "1".  Remove white label marked "2".  Rub patch adhesive wings for 2 additional minutes.   While looking in a mirror, press and release button in center of patch.  A small green light will flash 3-4 times .  This will be your only indicator the monitor has been turned on.     Do not shower for the first 24 hours.  You may shower after the first 24 hours.   Press button if you feel a symptom. You will hear a small click.  Record Date, Time and Symptom in the Patient Log Book.   When you are ready to remove patch, follow instructions on last 2 pages of Patient Log Book.  Stick patch monitor onto last page of Patient Log Book.   Place Patient Log Book in [address] box.  Use locking tab on box and tape box closed securely.  The [employer] has [employer] on it.  Please place in mailbox as soon as possible.  Your physician should have your test results approximately 7 days after the monitor has been mailed back to [facility].   Call [facility] Customer Care at [phone] if you have questions regarding your ZIO XT patch monitor.  Call them immediately if you  see an orange light blinking on your monitor.   If your monitor falls off in less than 4 days contact our Monitor department at [phone].  If your monitor becomes loose or falls off after 4 days call [employer] at [phone] for suggestions on securing your monitor    Follow-Up: At [facility], you and your health needs are our priority.  As part of our continuing mission to provide you with exceptional heart care, we have created designated Provider Care Teams.  These Care Teams include your primary Cardiologist (physician) and Advanced Practice Providers (APPs -  Physician Assistants and Nurse Practitioners) who all work together to provide you with the care you need, when you need it.  We recommend signing up for the patient portal called "MyChart".  Sign up information is provided on this After Visit Summary.  MyChart is used to connect with patients for Virtual Visits (Telemedicine).  Patients are able to view lab/test results, encounter notes, upcoming appointments, etc.  Non-urgent messages can be sent to your provider as well.   To learn more about what you can do with MyChart, go to [email or web address].    Your next appointment:   1 year(s)  Provider:   Dr Royann Shivers    Signed, Thurmon Fair, MD  06/07/2023 6:04 PM    [facility]

## 2023-06-10 DIAGNOSIS — R002 Palpitations: Secondary | ICD-10-CM

## 2023-07-01 DIAGNOSIS — R002 Palpitations: Secondary | ICD-10-CM | POA: Diagnosis not present

## 2023-07-02 ENCOUNTER — Telehealth: Payer: Self-pay | Admitting: Emergency Medicine

## 2023-07-02 MED ORDER — DILTIAZEM HCL ER COATED BEADS 180 MG PO CP24: 180.0000 mg | ORAL_CAPSULE | Freq: Every day | ORAL | 3 refills | Status: DC

## 2023-07-02 NOTE — Telephone Encounter (Signed)
Kathleen Fair, MD 07/02/2023  2:06 PM EST Back to Top    The symptoms appear to be due to very brief episodes of ectopic atrial tachycardia.  The longest one was only 20 seconds duration.  There was no atrial fibrillation.  The rhythm is not in and of itself dangerous, but I can see why she is feeling poorly when it does occur. Recommend stopping amlodipine and starting sustained-release diltiazem 180 mg once daily.  This should provide similar efficacy for blood pressure control with the added benefit of suppressing the arrhythmia.    Gave the information above to Kathleen Morales(dpr) He verbalized understanding, RX called in to preferred pharmacy. He verbalized understanding of All instructions.

## 2023-07-15 ENCOUNTER — Other Ambulatory Visit (HOSPITAL_COMMUNITY): Payer: PPO

## 2023-07-17 ENCOUNTER — Ambulatory Visit (HOSPITAL_COMMUNITY): Payer: PPO | Attending: Cardiovascular Disease

## 2023-07-17 DIAGNOSIS — I350 Nonrheumatic aortic (valve) stenosis: Secondary | ICD-10-CM | POA: Insufficient documentation

## 2023-07-17 LAB — ECHOCARDIOGRAM COMPLETE
AR max vel: 1.26 cm2
AV Area VTI: 1.47 cm2
AV Area mean vel: 1.22 cm2
AV Mean grad: 10.7 mm[Hg]
AV Peak grad: 20 mm[Hg]
Ao pk vel: 2.23 m/s
Area-P 1/2: 2.54 cm2
S' Lateral: 3 cm

## 2023-08-19 ENCOUNTER — Telehealth: Payer: Self-pay

## 2023-08-19 DIAGNOSIS — H52203 Unspecified astigmatism, bilateral: Secondary | ICD-10-CM | POA: Diagnosis not present

## 2023-08-19 DIAGNOSIS — H47021 Hemorrhage in optic nerve sheath, right eye: Secondary | ICD-10-CM | POA: Diagnosis not present

## 2023-08-19 DIAGNOSIS — H353131 Nonexudative age-related macular degeneration, bilateral, early dry stage: Secondary | ICD-10-CM | POA: Diagnosis not present

## 2023-08-19 DIAGNOSIS — Z961 Presence of intraocular lens: Secondary | ICD-10-CM | POA: Diagnosis not present

## 2023-08-19 NOTE — Telephone Encounter (Signed)
Pt requesting a refill of Sulfasalazine Russella Dar patient).  Please

## 2023-08-20 ENCOUNTER — Telehealth: Payer: Self-pay

## 2023-08-20 NOTE — Telephone Encounter (Signed)
Received a refill request from this [facility] patient for Sulfasalazine.  You are DOD this afternoon.  Please advise. Thanks!

## 2023-08-20 NOTE — Telephone Encounter (Signed)
She needs an appointment with a new provider-I suggest scheduling her with Dr. Doy Hutching since she is taking over Dr. Ardell Isaacs patients for the most part plus this lady has IBD.  You may refill the sulfasalazine so that she has enough to last her till her upcoming appointment.

## 2023-08-20 NOTE — Telephone Encounter (Signed)
 error

## 2023-08-25 NOTE — Telephone Encounter (Signed)
Lm on vm

## 2023-08-26 ENCOUNTER — Telehealth: Payer: Self-pay

## 2023-08-26 MED ORDER — SULFASALAZINE 500 MG PO TABS: 1000.0000 mg | ORAL_TABLET | Freq: Two times a day (BID) | ORAL | 4 refills | Status: DC

## 2023-08-26 NOTE — Telephone Encounter (Signed)
Refilled Sulfasalazine and scheduled appt with Dr. Doy Hutching per Dr. Leone Payor who was DOD when the refill request was received

## 2023-09-02 DIAGNOSIS — Z23 Encounter for immunization: Secondary | ICD-10-CM | POA: Diagnosis not present

## 2023-09-02 DIAGNOSIS — K5190 Ulcerative colitis, unspecified, without complications: Secondary | ICD-10-CM | POA: Diagnosis not present

## 2023-09-02 DIAGNOSIS — I10 Essential (primary) hypertension: Secondary | ICD-10-CM | POA: Diagnosis not present

## 2023-09-02 DIAGNOSIS — M159 Polyosteoarthritis, unspecified: Secondary | ICD-10-CM | POA: Diagnosis not present

## 2023-11-03 NOTE — Progress Notes (Unsigned)
 [facility] Return Visit   Referring Provider Lonie Peak, PA-C [address] [place],  [place] [address]  Primary Care Provider Lonie Peak, PA-C  Patient Profile: Kathleen Morales is a 89 y.o. female who returns to the [facility] for follow-up of the problem(s) noted below.  Problem List: Left-sided ulcerative colitis  Chronic constipation History of colon TVA Colonic diverticulosis Pancreatic cystic lesion   History of Present Illness   Kathleen Morales was last seen in the GI office 08/04/2022 by Dr. Russella Dar   Current GI Meds  Sulfasalazine 1 g p.o. twice daily Folic acid 1 mg p.o. daily MiraLAX 1 capful 2-3 times a week  Interval History  Kathleen Morales returns to the office today accompanied by her son reporting that she has been stable since her last visit.  Currently having 1 formed bowel movement every 2 days; no blood or mucus Reports lifelong symptoms of constipation and needs to take MiraLAX to regulate stools Taking 1 capful of MiraLAX 2-3 times per week Endorses consuming a healthy diet containing fruits and vegetables; eats little meat Acknowledges that she does not stay well-hydrated which may be contributing to constipation No abdominal pain or cramping  Denies extraintestinal manifestations of IBD -no fevers, chills, joint pain, skin rashes or oral ulcers  Weight, energy and appetite are stable Current weight is 117 pounds  Reports that she has had some recent elevation of blood pressures with SBP as high as 200 Discussed with her and her son that she should review this with her PCP   Last colonoscopy: 08/2010 -colitis in the rectosigmoid colon, left colon diverticulosis Last endoscopy: 07/2016 -benign stenosis at GE junction Savary dilated to 16 mm, salmon-colored mucosa in distal esophagus, diffuse erythema throughout stomach, small HH  Last Abd CT/CTE/MRE: MRI Abdomen 06/2019 -stable pancreatic cystic lesion  GI  Review of Symptoms Significant for chronic constipation. Otherwise negative.  General Review of Systems  Review of systems is significant for the pertinent positives and negatives as listed per the HPI.  Full ROS is otherwise negative.  Past Medical History   Past Medical History:  Diagnosis Date   Arthritis    takes Prednisone daily for inflammation   Bruises easily    Diverticulosis    Fibromyalgia    Gastric ulcer    hx of ;many yrs ago   Hemorrhoids    History of [place] spotted fever    Elevated titer 04/2006   Hoarseness    pt states always like this;ENT can't find anything wrong   Hx of colonic polyps    Joint pain    Joint swelling    Leg pain    Nocturia    Palpitations    Polymyalgia rheumatica (HCC)    Spine disorder    has had injections and wears a brace   Tubulovillous adenoma polyp of colon 07/1996   Ulcerative colitis 1970   takes Colazal bid     Past Surgical History   Past Surgical History:  Procedure Laterality Date   ABDOMINAL HYSTERECTOMY     CARPAL TUNNEL RELEASE     bilateral   CATARACT EXTRACTION, BILATERAL     COLONOSCOPY     CYSTECTOMY     Breast x 2-Right   epidural injections     x 2    IR VERTEBROPLASTY LUMBAR BX INC UNI/BIL INC/INJECT/IMAGING  08/12/2019   PARTIAL HYSTERECTOMY     at age 60   TOTAL KNEE ARTHROPLASTY  04/06/2012   Procedure:  TOTAL KNEE ARTHROPLASTY;  Surgeon: Valeria Batman, MD;  Location: [facility] OR;  Service: Orthopedics;  Laterality: Left;   TOTAL KNEE ARTHROPLASTY Right 05/02/2014   Procedure: TOTAL KNEE ARTHROPLASTY;  Surgeon: Valeria Batman, MD;  Location: [facility] OR;  Service: Orthopedics;  Laterality: Right;   TUBAL LIGATION       Allergies and Medications   Allergies  Allergen Reactions   Xarelto [Rivaroxaban] Swelling    Legs turned black    Current Meds  Medication Sig   alendronate (FOSAMAX) 10 MG tablet Take 10 mg by mouth every other day.   amLODipine (NORVASC) 5 MG tablet Take 5 mg by  mouth daily.   ascorbic acid (VITAMIN C) 500 MG tablet Take 500 mg by mouth daily.   cholecalciferol (VITAMIN D3) 25 MCG (1000 UNIT) tablet Take 1,000 Units by mouth daily.   folic acid (FOLVITE) 1 MG tablet Take 1 tablet (1 mg total) by mouth daily. (Patient taking differently: Take 1,000 mg by mouth daily.)   gabapentin (NEURONTIN) 300 MG capsule Take 1 capsule (300 mg total) by mouth 3 (three) times daily. Please call [phone] to schedule an appt.   MAGNESIUM PO Take 1 capsule by mouth daily.   predniSONE (DELTASONE) 5 MG tablet Take 5 mg by mouth daily.   vitamin B-12 (CYANOCOBALAMIN) 1000 MCG tablet Take 1,000 mcg by mouth daily.   Zinc 50 MG TABS Take 50 mg by mouth daily at 6 (six) AM.   [DISCONTINUED] sulfaSALAzine (AZULFIDINE) 500 MG tablet Take 2 tablets (1,000 mg total) by mouth 2 (two) times daily.     Family History   Family History  Problem Relation Age of Onset   Heart disease Mother    Heart disease Father    Breast cancer Sister    Breast cancer Daughter    Breast cancer Other        neice   Colon polyps Other        nephew   Colon cancer Neg Hx    Rectal cancer Neg Hx    Stomach cancer Neg Hx    Esophageal cancer Neg Hx     Social History   Social History   Tobacco Use   Smoking status: Never   Smokeless tobacco: Never  Vaping Use   Vaping status: Never Used  Substance Use Topics   Alcohol use: No    Alcohol/week: 0.0 standard drinks of alcohol   Drug use: No   Kathleen Morales reports that she has never smoked. She has never used smokeless tobacco. She reports that she does not drink alcohol and does not use drugs.  Vital Signs and Physical Examination   Vitals:   11/04/23 1029  BP: 118/62  Pulse: 74   Body mass index is 22.85 kg/m. Weight: 117 lb (53.1 kg)  General: Elderly, thin Head: Normocephalic and atraumatic Eyes: Sclerae anicteric, EOMI Lungs: Clear throughout to auscultation Heart: Regular rate and rhythm; No murmurs, rubs or  bruits Abdomen: Soft, non tender and non distended. No masses, hepatosplenomegaly or hernias noted. Normal Bowel sounds Musculoskeletal: Symmetrical with no gross deformities   Review of Data  The following data was reviewed at the time of this encounter:  Laboratory Studies      Latest Ref Rng & Units 11/04/2023   11:13 AM 08/04/2022    3:31 PM 02/09/2020    5:09 PM  CBC  WBC 4.0 - 10.5 K/uL 10.0  5.8  8.8   Hemoglobin 12.0 - 15.0 g/dL 44.0  14.0  13.5   Hematocrit 36.0 - 46.0 % 41.8  42.2  40.6   Platelets 150.0 - 400.0 K/uL 300.0  266.0  292.0     No results found for: "LIPASE"    Latest Ref Rng & Units 11/04/2023   11:13 AM 08/04/2022    3:31 PM 02/09/2020    5:09 PM  CMP  Glucose 70 - 99 mg/dL 98  92  629   BUN 6 - 23 mg/dL 18  15  21    Creatinine 0.40 - 1.20 mg/dL 5.28  4.13  2.44   Sodium 135 - 145 mEq/L 142  134  139   Potassium 3.5 - 5.1 mEq/L 4.1  4.8  4.6   Chloride 96 - 112 mEq/L 103  99  103   CO2 19 - 32 mEq/L 31  28  31    Calcium 8.4 - 10.5 mg/dL 9.8  9.6  9.8   Total Protein 6.0 - 8.3 g/dL 7.1  7.3  6.8   Total Bilirubin 0.2 - 1.2 mg/dL 0.5  0.4  0.4   Alkaline Phos 39 - 117 U/L 94  71  65   AST 0 - 37 U/L 22  27  19    ALT 0 - 35 U/L 12  12  10       Imaging Studies  MRI abdomen 07/17/2019 No significant change in size of cystic lesion within the body of pancreas. No internal septation or mural nodule identified. Based on size and age of patient no further follow-up is indicated at this time. This recommendation follows ACR consensus guidelines: Management of Incidental Pancreatic Cysts: A White Paper of the ACR Incidental Findings Committee. J Am Coll Radiol 2017;14:911-923.  GI Procedures and Studies  EGD 07/2016 Benign stenosis at GE junction Savary dilated to 16 mm, salmon-colored mucosa in distal esophagus, diffuse erythema throughout stomach, small HH  Colonoscopy 08/2008 Mild diverticulosis in the sigmoid colon Proctitis in the  rectum  Colonoscopy 02/2006 Normal descending colon to cecum Left colon diverticulosis Internal hemorrhoids  Clinical Impression  It is my clinical impression that Kathleen Morales is a 89 y.o. female with;  Left-sided ulcerative colitis  Constipation History of colon TVA Colonic diverticulosis Pancreatic cystic lesion  Kathleen Morales presents to the office today for follow-up of a longstanding diagnosis of left-sided ulcerative colitis.  She is currently maintained on sulfasalazine 1 g p.o. twice daily in conjunction with folic acid and has remained in clinical remission without any flares.  Laboratory studies have been stable and she is due for updated lab work today.  She has had longstanding chronic constipation that is readily managed with MiraLAX.  Has satisfactory bowel frequency and consistency.  Given age she is no longer under surveillance for colon polyps or pancreatic cystic lesion.  Plan  Continue sulfasalazine 1 g p.o. twice daily Continue folic acid 1 mg p.o. daily Continue MiraLAX 1 cap 2-3 times per week-titrate to effect Advised high-fiber diet and adequate hydration for management of constipation Labs today: CBC, CMP, ESR, CRP, vitamin D Monitor weight and anthropometrics    IBD Health Maintenance  Vaccinations Influenza: PCV13: PPSV23: COVID19: HAV/HBV: Shingles: HPV:  DEXA PRN  Pap Smear N/A  Eye Exam PRN  Skin Exam N/A  Surveillance Colonoscopy No further surveillance  Tobacco Use None  Depression Screen    Planned Follow Up  1 year  The patient or caregiver verbalized understanding of the material covered, with no barriers to understanding. All questions were answered. Patient or caregiver is agreeable with  the plan outlined above.    It was a pleasure to see Kathleen Morales.  If you have any questions or concerns regarding this evaluation, do not hesitate to contact me.  Maren Beach, MD [facility]

## 2023-11-04 ENCOUNTER — Other Ambulatory Visit (INDEPENDENT_AMBULATORY_CARE_PROVIDER_SITE_OTHER)

## 2023-11-04 ENCOUNTER — Encounter: Payer: Self-pay | Admitting: Pediatrics

## 2023-11-04 ENCOUNTER — Ambulatory Visit: Payer: PPO | Admitting: Pediatrics

## 2023-11-04 VITALS — BP 118/62 | HR 74 | Ht 60.0 in | Wt 117.0 lb

## 2023-11-04 DIAGNOSIS — K5150 Left sided colitis without complications: Secondary | ICD-10-CM

## 2023-11-04 DIAGNOSIS — K5909 Other constipation: Secondary | ICD-10-CM | POA: Diagnosis not present

## 2023-11-04 DIAGNOSIS — K862 Cyst of pancreas: Secondary | ICD-10-CM | POA: Diagnosis not present

## 2023-11-04 DIAGNOSIS — K5730 Diverticulosis of large intestine without perforation or abscess without bleeding: Secondary | ICD-10-CM | POA: Diagnosis not present

## 2023-11-04 DIAGNOSIS — Z860101 Personal history of adenomatous and serrated colon polyps: Secondary | ICD-10-CM

## 2023-11-04 DIAGNOSIS — K5904 Chronic idiopathic constipation: Secondary | ICD-10-CM

## 2023-11-04 LAB — COMPREHENSIVE METABOLIC PANEL WITH GFR
ALT: 12 U/L (ref 0–35)
AST: 22 U/L (ref 0–37)
Albumin: 4.5 g/dL (ref 3.5–5.2)
Alkaline Phosphatase: 94 U/L (ref 39–117)
BUN: 18 mg/dL (ref 6–23)
CO2: 31 meq/L (ref 19–32)
Calcium: 9.8 mg/dL (ref 8.4–10.5)
Chloride: 103 meq/L (ref 96–112)
Creatinine, Ser: 0.79 mg/dL (ref 0.40–1.20)
GFR: 64.55 mL/min (ref >60.00)
Glucose, Bld: 98 mg/dL (ref 70–99)
Potassium: 4.1 meq/L (ref 3.5–5.1)
Sodium: 142 meq/L (ref 135–145)
Total Bilirubin: 0.5 mg/dL (ref 0.2–1.2)
Total Protein: 7.1 g/dL (ref 6.0–8.3)

## 2023-11-04 LAB — CBC WITH DIFFERENTIAL/PLATELET
Basophils Absolute: 0 10*3/uL (ref 0.0–0.1)
Basophils Relative: 0.4 % (ref 0.0–3.0)
Eosinophils Absolute: 0 10*3/uL (ref 0.0–0.7)
Eosinophils Relative: 0.4 % (ref 0.0–5.0)
HCT: 41.8 % (ref 36.0–46.0)
Hemoglobin: 13.9 g/dL (ref 12.0–15.0)
Lymphocytes Relative: 17.8 % (ref 12.0–46.0)
Lymphs Abs: 1.8 10*3/uL (ref 0.7–4.0)
MCHC: 33.4 g/dL (ref 30.0–36.0)
MCV: 102.3 fl — ABNORMAL HIGH (ref 78.0–100.0)
Monocytes Absolute: 0.8 10*3/uL (ref 0.1–1.0)
Monocytes Relative: 7.8 % (ref 3.0–12.0)
Neutro Abs: 7.4 10*3/uL (ref 1.4–7.7)
Neutrophils Relative %: 73.6 % (ref 43.0–77.0)
Platelets: 300 10*3/uL (ref 150.0–400.0)
RBC: 4.08 Mil/uL (ref 3.87–5.11)
RDW: 13.6 % (ref 11.5–15.5)
WBC: 10 10*3/uL (ref 4.0–10.5)

## 2023-11-04 LAB — VITAMIN D 25 HYDROXY (VIT D DEFICIENCY, FRACTURES): VITD: 43.15 ng/mL (ref 30.00–100.00)

## 2023-11-04 LAB — SEDIMENTATION RATE: Sed Rate: 2 mm/h (ref 0–30)

## 2023-11-04 LAB — C-REACTIVE PROTEIN: CRP: <1 mg/dL (ref 0.5–20.0)

## 2023-11-04 MED ORDER — SULFASALAZINE 500 MG PO TABS: 1000.0000 mg | ORAL_TABLET | Freq: Two times a day (BID) | ORAL | 3 refills | Status: AC

## 2023-11-04 NOTE — Patient Instructions (Addendum)
 Your provider has requested that you go to the basement level for lab work before leaving today. Press "B" on the elevator. The lab is located at the first door on the left as you exit the elevator.  Due to recent changes in healthcare laws, you may see the results of your imaging and laboratory studies on MyChart before your provider has had a chance to review them.  We understand that in some cases there may be results that are confusing or concerning to you. Not all laboratory results come back in the same time frame and the provider may be waiting for multiple results in order to interpret others.  Please give [place] 48 hours in order for your provider to thoroughly review all the results before contacting the office for clarification of your results.    We have sent the following medications to your pharmacy for you to pick up at your convenience: Sulfasalazine 1000 mg twice a day.  Thank you for entrusting me with your care and for choosing [facility], Dr. Maren Beach   _______________________________________________________  If your blood pressure at your visit was 140/90 or greater, please contact your primary care physician to follow up on this.  _______________________________________________________  If you are age 88 or older, your body mass index should be between 23-30. Your Body mass index is 22.85 kg/m. If this is out of the aforementioned range listed, please consider follow up with your Primary Care Provider.  If you are age 65 or younger, your body mass index should be between 19-25. Your Body mass index is 22.85 kg/m. If this is out of the aformentioned range listed, please consider follow up with your Primary Care Provider.   ________________________________________________________  The [facility] providers would like to encourage you to use [facility] to communicate with providers for non-urgent requests or questions.  Due to long hold times on the telephone,  sending your provider a message by [facility] may be a faster and more efficient way to get a response.  Please allow 48 business hours for a response.  Please remember that this is for non-urgent requests.  _______________________________________________________

## 2023-11-25 ENCOUNTER — Ambulatory Visit: Admitting: Podiatry

## 2023-12-15 DIAGNOSIS — H01005 Unspecified blepharitis left lower eyelid: Secondary | ICD-10-CM | POA: Diagnosis not present

## 2023-12-15 DIAGNOSIS — H353131 Nonexudative age-related macular degeneration, bilateral, early dry stage: Secondary | ICD-10-CM | POA: Diagnosis not present

## 2023-12-15 DIAGNOSIS — Z961 Presence of intraocular lens: Secondary | ICD-10-CM | POA: Diagnosis not present

## 2024-02-02 ENCOUNTER — Other Ambulatory Visit (HOSPITAL_BASED_OUTPATIENT_CLINIC_OR_DEPARTMENT_OTHER): Payer: Self-pay

## 2024-02-02 ENCOUNTER — Encounter (HOSPITAL_BASED_OUTPATIENT_CLINIC_OR_DEPARTMENT_OTHER): Payer: Self-pay | Admitting: Emergency Medicine

## 2024-02-02 ENCOUNTER — Ambulatory Visit (HOSPITAL_BASED_OUTPATIENT_CLINIC_OR_DEPARTMENT_OTHER): Admitting: Radiology

## 2024-02-02 ENCOUNTER — Ambulatory Visit (HOSPITAL_BASED_OUTPATIENT_CLINIC_OR_DEPARTMENT_OTHER)
Admission: EM | Admit: 2024-02-02 | Discharge: 2024-02-02 | Disposition: A | Discharge status: Home / Self Care | Attending: Family Medicine | Admitting: Family Medicine

## 2024-02-02 DIAGNOSIS — M7989 Other specified soft tissue disorders: Secondary | ICD-10-CM

## 2024-02-02 DIAGNOSIS — M79661 Pain in right lower leg: Secondary | ICD-10-CM

## 2024-02-02 DIAGNOSIS — M79662 Pain in left lower leg: Secondary | ICD-10-CM

## 2024-02-02 DIAGNOSIS — I872 Venous insufficiency (chronic) (peripheral): Secondary | ICD-10-CM

## 2024-02-02 MED ORDER — HYDROCHLOROTHIAZIDE 12.5 MG PO TABS
12.5000 mg | ORAL_TABLET | Freq: Every day | ORAL | 0 refills | Status: AC
  Filled 2024-02-02: qty 3, 3d supply, fill #0

## 2024-02-02 NOTE — ED Provider Notes (Signed)
 [facility]    CSN: [id number] Arrival date & time: 02/02/24  1054      History   Chief Complaint Chief Complaint  Patient presents with   Leg Pain    HPI Kathleen Morales is a 89 y.o. female.   89 year old female here with her husband.  Patient reports that she has had left lower leg discomfort or pain since approximately 01/10/2024 or earlier.  She woke up this morning and she now has some right lower leg pain.  She has chronic swelling in both legs but the left seems worse than the right in the last couple of days.  She has chronic hyperpigmentation of her lower legs for years.  She has a diagnosis of polymyalgia rheumatica and takes prednisone  5 mg daily.     Past Medical History:  Diagnosis Date   Arthritis    takes Prednisone  daily for inflammation   Bruises easily    Diverticulosis    Fibromyalgia    Gastric ulcer    hx of ;many yrs ago   Hemorrhoids    History of [place] spotted fever    Elevated titer 04/2006   Hoarseness    pt states always like this;ENT can't find anything wrong   Hx of colonic polyps    Joint pain    Joint swelling    Leg pain    Nocturia    Palpitations    Polymyalgia rheumatica (HCC)    Spine disorder    has had injections and wears a brace   Tubulovillous adenoma polyp of colon 07/1996   Ulcerative colitis 1970   takes Colazal  bid    Patient Active Problem List   Diagnosis Date Noted   Trochanteric bursitis, left hip 04/15/2017   Hip pain, chronic, left 04/13/2017   Closed compression fracture of L4 vertebra (HCC) 04/06/2017   Gait abnormality 11/26/2016   Left leg swelling 11/26/2016   Abnormality of gait 10/24/2015   Right lumbar radiculopathy 09/26/2015   Osteoarthritis of right knee 05/04/2014   S/P total knee replacement using cement 05/02/2014   Syncope 03/24/2013   Dizziness 03/24/2013   Elevated blood pressure 03/24/2013   Osteoarthritis of knee 04/05/2012   Preoperative clearance 03/17/2012    Varicose veins 03/17/2011   Claudication 03/17/2011   Dyspnea 03/17/2011   POLYMYALGIA RHEUMATICA 11/15/2009   ULCERATIVE COLITIS-LEFT SIDE 08/15/2008   History of colonic polyps 08/15/2008    Past Surgical History:  Procedure Laterality Date   ABDOMINAL HYSTERECTOMY     CARPAL TUNNEL RELEASE     bilateral   CATARACT EXTRACTION, BILATERAL     COLONOSCOPY     CYSTECTOMY     Breast x 2-Right   epidural injections     x 2    IR VERTEBROPLASTY LUMBAR BX INC UNI/BIL INC/INJECT/IMAGING  08/12/2019   PARTIAL HYSTERECTOMY     at age 30   TOTAL KNEE ARTHROPLASTY  04/06/2012   Procedure: TOTAL KNEE ARTHROPLASTY;  Surgeon: Maude LELON Right, MD;  Location: [facility] OR;  Service: Orthopedics;  Laterality: Left;   TOTAL KNEE ARTHROPLASTY Right 05/02/2014   Procedure: TOTAL KNEE ARTHROPLASTY;  Surgeon: Maude LELON Right, MD;  Location: [facility] OR;  Service: Orthopedics;  Laterality: Right;   TUBAL LIGATION      OB History   No obstetric history on file.      Home Medications    Prior to Admission medications   Medication Sig Start Date End Date Taking? Authorizing Provider  alendronate (FOSAMAX) 10  MG tablet Take 10 mg by mouth every other day. 08/26/21  Yes [provider]  diltiazem  (CARDIZEM  CD) 180 MG 24 hr capsule Take 1 capsule (180 mg total) by mouth daily. 07/02/23 02/02/24 Yes Croitoru, Mihai, MD  gabapentin  (NEURONTIN ) 300 MG capsule Take 1 capsule (300 mg total) by mouth 3 (three) times daily. Please call [phone] to schedule an appt. 04/16/18  Yes Onita Duos, MD  hydrochlorothiazide  (HYDRODIURIL ) 12.5 MG tablet Take 1 tablet (12.5 mg total) by mouth daily for 3 days. 02/02/24 02/05/24 Yes Ival Domino, FNP  MAGNESIUM  PO Take 1 capsule by mouth daily.   Yes [provider]  predniSONE  (DELTASONE ) 5 MG tablet Take 5 mg by mouth daily.   Yes [provider]  sulfaSALAzine  (AZULFIDINE ) 500 MG tablet Take 2 tablets (1,000 mg total) by mouth 2 (two) times daily. 11/04/23  Yes  McGreal, Inocente HERO, MD  vitamin B-12 (CYANOCOBALAMIN ) 1000 MCG tablet Take 1,000 mcg by mouth daily.   Yes [provider]  Zinc 50 MG TABS Take 50 mg by mouth daily at 6 (six) AM.   Yes [provider]  ascorbic acid (VITAMIN C) 500 MG tablet Take 500 mg by mouth daily.    [provider]  cholecalciferol (VITAMIN D3) 25 MCG (1000 UNIT) tablet Take 1,000 Units by mouth daily.    [provider]  folic acid  (FOLVITE ) 1 MG tablet Take 1 tablet (1 mg total) by mouth daily. Patient taking differently: Take 1,000 mg by mouth daily. 06/16/22   Aneita Gwendlyn DASEN, MD    Family History Family History  Problem Relation Age of Onset   Heart disease Mother    Heart disease Father    Breast cancer Sister    Breast cancer Daughter    Breast cancer Other        neice   Colon polyps Other        nephew   Colon cancer Neg Hx    Rectal cancer Neg Hx    Stomach cancer Neg Hx    Esophageal cancer Neg Hx     Social History Social History   Tobacco Use   Smoking status: Never   Smokeless tobacco: Never  Vaping Use   Vaping status: Never Used  Substance Use Topics   Alcohol use: No    Alcohol/week: 0.0 standard drinks of alcohol   Drug use: No     Allergies   Xarelto  [rivaroxaban ]   Review of Systems Review of Systems  Constitutional:  Negative for fever.  Respiratory:  Negative for cough.   Cardiovascular:  Positive for leg swelling. Negative for chest pain.  Gastrointestinal:  Negative for abdominal pain, constipation, diarrhea, nausea and vomiting.  Musculoskeletal:  Positive for arthralgias (Bilateral lower leg pain.). Negative for back pain.  Skin:  Negative for color change and rash.  Neurological:  Negative for syncope.  All other systems reviewed and are negative.    Physical Exam Triage Vital Signs ED Triage Vitals  Encounter Vitals Group     BP      Girls Systolic BP Percentile      Girls Diastolic BP Percentile      Boys Systolic  BP Percentile      Boys Diastolic BP Percentile      Pulse      Resp      Temp      Temp src      SpO2      Weight  Height      Head Circumference      Peak Flow      Pain Score      Pain Loc      Pain Education      Exclude from Growth Chart    No data found.  Updated Vital Signs BP (!) 150/73 (BP Location: Right Arm)   Pulse 75   Temp 98.2 F (36.8 C) (Oral)   Resp 18   SpO2 95%   Visual Acuity Right Eye Distance:   Left Eye Distance:   Bilateral Distance:    Right Eye Near:   Left Eye Near:    Bilateral Near:     Physical Exam Vitals and nursing note reviewed.  Constitutional:      General: She is not in acute distress.    Appearance: She is well-developed. She is not ill-appearing or toxic-appearing.  HENT:     Head: Normocephalic and atraumatic.     Right Ear: Tympanic membrane, ear canal and external ear normal. Decreased hearing (Uses a hearing) noted.     Left Ear: Tympanic membrane, ear canal and external ear normal. Decreased hearing (Uses a hearing) noted.     Nose: No congestion or rhinorrhea.     Right Sinus: No maxillary sinus tenderness or frontal sinus tenderness.     Left Sinus: No maxillary sinus tenderness or frontal sinus tenderness.     Mouth/Throat:     Lips: Pink.     Mouth: Mucous membranes are moist.     Pharynx: Uvula midline. No oropharyngeal exudate or posterior oropharyngeal erythema.     Tonsils: No tonsillar exudate.  Eyes:     Conjunctiva/sclera: Conjunctivae normal.     Pupils: Pupils are equal, round, and reactive to light.  Cardiovascular:     Rate and Rhythm: Normal rate and regular rhythm.     Pulses:          Dorsalis pedis pulses are 2+ on the right side and 2+ on the left side.       Posterior tibial pulses are 2+ on the right side and 2+ on the left side.     Heart sounds: S1 normal and S2 normal. Murmur heard.     Systolic murmur is present with a grade of 3/6.  Pulmonary:     Effort: Pulmonary effort is  normal. No respiratory distress.     Breath sounds: Normal breath sounds. No decreased breath sounds, wheezing, rhonchi or rales.  Abdominal:     General: Bowel sounds are normal.     Palpations: Abdomen is soft.     Tenderness: There is no abdominal tenderness.  Musculoskeletal:        General: No swelling.     Cervical back: Neck supple.     Right lower leg: Tenderness present. 2+ Edema present.     Left lower leg: Tenderness present. 2+ Edema present.     Right ankle: Normal.     Left ankle: Normal.     Comments: Chronic edema bilateral lower legs with left worse than right.  Chronic hyperpigmentation of bilateral lower legs second to stasis dermatitis.  Edema is actually from mid lower leg to the knee and she has thin lower legs at the ankles without edema.  See photo for more information.  Lymphadenopathy:     Head:     Right side of head: No submental, submandibular, tonsillar, preauricular or posterior auricular adenopathy.     Left side of head: No submental,  submandibular, tonsillar, preauricular or posterior auricular adenopathy.     Cervical: No cervical adenopathy.     Right cervical: No superficial cervical adenopathy.    Left cervical: No superficial cervical adenopathy.  Skin:    General: Skin is warm and dry.     Capillary Refill: Capillary refill takes less than 2 seconds.     Findings: No rash.  Neurological:     Mental Status: She is alert and oriented to person, place, and time.  Psychiatric:        Mood and Affect: Mood normal.      UC Treatments / Results  Labs (all labs ordered are listed, but only abnormal results are displayed) Labs Reviewed - No data to display  Comp Met (CMET): 11/04/23:     Component Ref Range & Units (hover) 3 mo ago (11/04/23)  Sodium 142  Potassium 4.1  Chloride 103  CO2 31  Glucose, Bld 98  BUN 18  Creatinine, Ser 0.79  Total Bilirubin 0.5  Alkaline Phosphatase 94  AST 22  ALT 12  Total Protein 7.1  Albumin 4.5  GFR  64.55  Comment: Calculated using the CKD-EPI Creatinine Equation (2021)  Calcium 9.8  Resulting Agency [place] [facility]      EKG   Radiology US  Venous Img Lower Bilateral Result Date: 02/02/2024 CLINICAL DATA:  bilateral lower leg swelling pain EXAM: BILATERAL LOWER EXTREMITY VENOUS DOPPLER ULTRASOUND TECHNIQUE: Gray-scale sonography with graded compression, as well as color Doppler and duplex ultrasound were performed to evaluate the lower extremity deep venous systems from the level of the common femoral vein and including the common femoral, femoral, profunda femoral, popliteal and calf veins including the posterior tibial, peroneal and gastrocnemius veins when visible. The superficial great saphenous vein was also interrogated. Spectral Doppler was utilized to evaluate flow at rest and with distal augmentation maneuvers in the common femoral, femoral and popliteal veins. COMPARISON:  March 18, 2017 FINDINGS: RIGHT LOWER EXTREMITY Common Femoral Vein: No evidence of thrombus. Normal compressibility, respiratory phasicity and response to augmentation. Saphenofemoral Junction: No evidence of thrombus. Normal compressibility and flow on color Doppler imaging. Profunda Femoral Vein: No evidence of thrombus. Normal compressibility and flow on color Doppler imaging. Femoral Vein: No evidence of thrombus. Normal compressibility, respiratory phasicity and response to augmentation. Popliteal Vein: No evidence of thrombus. Normal compressibility, respiratory phasicity and response to augmentation. Calf Veins: No evidence of thrombus. Normal compressibility and flow on color Doppler imaging. Superficial Great Saphenous Vein: No evidence of thrombus. Normal compressibility. Other Findings:  None. LEFT LOWER EXTREMITY Common Femoral Vein: No evidence of thrombus. Normal compressibility, respiratory phasicity and response to augmentation. Saphenofemoral Junction: No evidence of thrombus. Normal compressibility and  flow on color Doppler imaging. Profunda Femoral Vein: No evidence of thrombus. Normal compressibility and flow on color Doppler imaging. Femoral Vein: No evidence of thrombus. Normal compressibility, respiratory phasicity and response to augmentation. Popliteal Vein: No evidence of thrombus. Normal compressibility, respiratory phasicity and response to augmentation. Calf Veins: No evidence of thrombus. Normal compressibility and flow on color Doppler imaging. Superficial Great Saphenous Vein: No evidence of thrombus. Normal compressibility. Other Findings: Bilobed cystic structure in the popliteal fossa, measuring 4.5 x 2.5 x 0.9 cm, likely a Baker's cyst. IMPRESSION: Negative for deep venous thrombosis within both legs. Electronically Signed   By: Rogelia Myers M.D.   On: 02/02/2024 12:52    Procedures Procedures (including critical care time)  Medications Ordered in UC Medications - No data to display  Initial  Impression / Assessment and Plan / UC Course  I have reviewed the triage vital signs and the nursing notes.  Pertinent labs & imaging results that were available during my care of the patient were reviewed by me and considered in my medical decision making (see chart for details).  Plan of Care: Swelling bilateral lower legs with chronic stasis dermatitis: Ultrasound was negative for any blood clots.  Hydrochlorothiazide , 12.5 mg, 1 daily for up to 3 days.  This is a fluid pill or diuretic.  If significant decrease in leg swelling in 1 day may only need to take 1 day of the hydrochlorothiazide .  Instructed to drink a cup of orange juice or eat a banana or eat a cup of cantaloupe to provide extra potassium every day that you take these pills.  Follow-up with primary care provider for management of this chronic swelling in the legs and may need a change in blood pressure medication to help reduce the swelling.  Follow-up here as needed.  I reviewed the plan of care with the patient  and/or the patient's guardian.  The patient and/or guardian had time to ask questions and acknowledged that the questions were answered.  I provided instruction on symptoms or reasons to return here or to go to an ER, if symptoms/condition did not improve, worsened or if new symptoms occurred.  Final Clinical Impressions(s) / UC Diagnoses   Final diagnoses:  Pain and swelling of left lower leg  Pain and swelling of right lower leg  Stasis dermatitis of both legs     Discharge Instructions      Pain and swelling of the right and left lower legs and chronic stasis dermatitis: I reviewed the ultrasound and it appears normal or negative for blood clots.  Ultrasound read or impression from radiology is negative.  No blood clots.  Provided hydrochlorothiazide , 12.5 mg, 1 pill daily for 3 days.  Advised to either drink a glass of orange juice every day or eat a banana every day or eat a cup of cantaloupe every day to add extra potassium to your diet since you are can to be taking a fluid pill or diuretic.  Try to increase water intake if at all possible.  Follow-up with family practice provider for further workup of the chronic swelling in the legs.  Patient is on prednisone  daily for her polymyalgia rheumatica.  She is no longer on amlodipine.  She may need adjustment of her blood pressure medicine and that may help the swelling also     ED Prescriptions     Medication Sig Dispense Auth. Provider   hydrochlorothiazide  (HYDRODIURIL ) 12.5 MG tablet Take 1 tablet (12.5 mg total) by mouth daily for 3 days. 3 tablet Carrera Kiesel, FNP      PDMP not reviewed this encounter.   Ival Domino, FNP 02/02/24 1302

## 2024-02-02 NOTE — ED Triage Notes (Signed)
 Pt reports her left leg has been hurting for 3 weeks and then this morning she woke up with her right leg hurting. Pt does have some swelling noted.

## 2024-02-02 NOTE — Discharge Instructions (Addendum)
 Pain and swelling of the right and left lower legs and chronic stasis dermatitis: I reviewed the ultrasound and it appears normal or negative for blood clots.  Ultrasound read or impression from radiology is negative.  No blood clots.  Provided hydrochlorothiazide , 12.5 mg, 1 pill daily for 3 days.  Advised to either drink a glass of orange juice every day or eat a banana every day or eat a cup of cantaloupe every day to add extra potassium to your diet since you are can to be taking a fluid pill or diuretic.  Try to increase water intake if at all possible.  Follow-up with family practice provider for further workup of the chronic swelling in the legs.  Patient is on prednisone  daily for her polymyalgia rheumatica.  She is no longer on amlodipine.  She may need adjustment of her blood pressure medicine and that may help the swelling also

## 2024-02-20 DIAGNOSIS — R0981 Nasal congestion: Secondary | ICD-10-CM | POA: Diagnosis not present

## 2024-02-20 DIAGNOSIS — R0602 Shortness of breath: Secondary | ICD-10-CM | POA: Diagnosis not present

## 2024-02-20 DIAGNOSIS — R059 Cough, unspecified: Secondary | ICD-10-CM | POA: Diagnosis not present

## 2024-03-16 DIAGNOSIS — M353 Polymyalgia rheumatica: Secondary | ICD-10-CM | POA: Diagnosis not present

## 2024-03-16 DIAGNOSIS — M159 Polyosteoarthritis, unspecified: Secondary | ICD-10-CM | POA: Diagnosis not present

## 2024-03-16 DIAGNOSIS — Z9181 History of falling: Secondary | ICD-10-CM | POA: Diagnosis not present

## 2024-03-16 DIAGNOSIS — I10 Essential (primary) hypertension: Secondary | ICD-10-CM | POA: Diagnosis not present

## 2024-03-16 DIAGNOSIS — I4719 Other supraventricular tachycardia: Secondary | ICD-10-CM | POA: Diagnosis not present

## 2024-03-16 DIAGNOSIS — M818 Other osteoporosis without current pathological fracture: Secondary | ICD-10-CM | POA: Diagnosis not present

## 2024-03-16 DIAGNOSIS — Z1331 Encounter for screening for depression: Secondary | ICD-10-CM | POA: Diagnosis not present

## 2024-03-16 DIAGNOSIS — T380X5A Adverse effect of glucocorticoids and synthetic analogues, initial encounter: Secondary | ICD-10-CM | POA: Diagnosis not present

## 2024-03-16 DIAGNOSIS — K5190 Ulcerative colitis, unspecified, without complications: Secondary | ICD-10-CM | POA: Diagnosis not present

## 2024-03-16 DIAGNOSIS — Z139 Encounter for screening, unspecified: Secondary | ICD-10-CM | POA: Diagnosis not present

## 2024-03-16 DIAGNOSIS — M545 Low back pain, unspecified: Secondary | ICD-10-CM | POA: Diagnosis not present

## 2024-03-30 DIAGNOSIS — S7001XA Contusion of right hip, initial encounter: Secondary | ICD-10-CM | POA: Diagnosis not present

## 2024-03-30 DIAGNOSIS — S51811A Laceration without foreign body of right forearm, initial encounter: Secondary | ICD-10-CM | POA: Diagnosis not present

## 2024-03-30 DIAGNOSIS — W19XXXA Unspecified fall, initial encounter: Secondary | ICD-10-CM | POA: Diagnosis not present

## 2024-03-30 DIAGNOSIS — M25551 Pain in right hip: Secondary | ICD-10-CM | POA: Diagnosis not present

## 2024-03-30 DIAGNOSIS — M79631 Pain in right forearm: Secondary | ICD-10-CM | POA: Diagnosis not present

## 2024-04-20 DIAGNOSIS — Z23 Encounter for immunization: Secondary | ICD-10-CM | POA: Diagnosis not present

## 2024-05-25 DIAGNOSIS — M47816 Spondylosis without myelopathy or radiculopathy, lumbar region: Secondary | ICD-10-CM | POA: Diagnosis not present

## 2024-05-25 DIAGNOSIS — M25551 Pain in right hip: Secondary | ICD-10-CM | POA: Diagnosis not present

## 2024-06-09 ENCOUNTER — Other Ambulatory Visit: Payer: Self-pay | Admitting: Cardiovascular Disease

## 2024-06-09 DIAGNOSIS — M47816 Spondylosis without myelopathy or radiculopathy, lumbar region: Secondary | ICD-10-CM | POA: Diagnosis not present

## 2024-06-15 DIAGNOSIS — M19042 Primary osteoarthritis, left hand: Secondary | ICD-10-CM | POA: Diagnosis not present

## 2024-06-15 DIAGNOSIS — M19041 Primary osteoarthritis, right hand: Secondary | ICD-10-CM | POA: Diagnosis not present

## 2024-06-15 DIAGNOSIS — G5601 Carpal tunnel syndrome, right upper limb: Secondary | ICD-10-CM | POA: Diagnosis not present

## 2024-07-22 ENCOUNTER — Other Ambulatory Visit: Payer: Self-pay | Admitting: Cardiovascular Disease

## 2024-08-03 ENCOUNTER — Telehealth: Payer: Self-pay | Admitting: Cardiovascular Disease

## 2024-08-03 NOTE — Telephone Encounter (Signed)
" °*  STAT* If patient is at the pharmacy, call can be transferred to refill team.   1. Which medications need to be refilled? (please list name of each medication and dose if known) diltiazem  (CARDIZEM  CD) 180 MG 24 hr capsule    2. Would you like to learn more about the convenience, safety, & potential cost savings by using the [facility]? No   3. Are you open to using the [facility] (Type [facility]. ) No   4. Which pharmacy/location (including street and city if local pharmacy) is medication to be sent to? [facility] - [place], [place] - [address]    5. Do they need a 30 day or 90 day supply? 90 day  Pt not able to schedule f/u due to provider's calendar being full through 08/29/24 (she does not want to see PA/NP). I requested that pt c/b Monday in hopes that calendar is out further.  "

## 2024-08-05 MED ORDER — DILTIAZEM HCL ER COATED BEADS 180 MG PO CP24: 180.0000 mg | ORAL_CAPSULE | Freq: Every day | ORAL | 0 refills | Status: DC

## 2024-08-05 NOTE — Telephone Encounter (Signed)
 Too soon for a refill. Will leave this request in pool until new Appt scheduled

## 2024-08-08 ENCOUNTER — Other Ambulatory Visit: Payer: Self-pay | Admitting: Cardiovascular Disease

## 2024-08-09 ENCOUNTER — Other Ambulatory Visit: Payer: Self-pay | Admitting: Cardiovascular Disease

## 2024-08-15 ENCOUNTER — Telehealth: Payer: Self-pay | Admitting: Cardiovascular Disease

## 2024-08-15 NOTE — Telephone Encounter (Signed)
" °*  STAT* If patient is at the pharmacy, call can be transferred to refill team.   1. Which medications need to be refilled? (please list name of each medication and dose if known)   diltiazem  (CARDIZEM  CD) 180 MG 24 hr capsule    2. Which pharmacy/location (including street and city if local pharmacy) is medication to be sent to? [facility] - [place], [place] - [address]   3. Do they need a 30 day or 90 day supply? 90 . "

## 2024-08-17 ENCOUNTER — Other Ambulatory Visit: Payer: Self-pay | Admitting: Cardiovascular Disease

## 2024-08-17 NOTE — Telephone Encounter (Signed)
Patient daughter calling to check status of refill

## 2024-08-18 ENCOUNTER — Other Ambulatory Visit: Payer: Self-pay | Admitting: Cardiovascular Disease

## 2024-08-18 MED ORDER — DILTIAZEM HCL ER COATED BEADS 180 MG PO CP24: 180.0000 mg | ORAL_CAPSULE | Freq: Every day | ORAL | 0 refills | Status: AC

## 2024-08-18 NOTE — Telephone Encounter (Signed)
 Sent in enough to get pt to appointment.

## 2024-09-21 ENCOUNTER — Ambulatory Visit: Admitting: Emergency Medicine
